# Patient Record
Sex: Female | Born: 1935 | Race: White | Hispanic: No | Marital: Married | Smoking: Never smoker
Health system: Southern US, Community
[De-identification: ages and names within clinical notes are randomized; demographics above are authoritative.]

## PROBLEM LIST (undated history)

## (undated) ENCOUNTER — Emergency Department (HOSPITAL_COMMUNITY): Discharge status: Absent without leave | Payer: Medicare Other

## (undated) DIAGNOSIS — M1990 Unspecified osteoarthritis, unspecified site: Secondary | ICD-10-CM

## (undated) DIAGNOSIS — I4891 Unspecified atrial fibrillation: Secondary | ICD-10-CM

## (undated) DIAGNOSIS — I739 Peripheral vascular disease, unspecified: Secondary | ICD-10-CM

## (undated) DIAGNOSIS — Z8719 Personal history of other diseases of the digestive system: Secondary | ICD-10-CM

## (undated) DIAGNOSIS — N3090 Cystitis, unspecified without hematuria: Secondary | ICD-10-CM

## (undated) DIAGNOSIS — I10 Essential (primary) hypertension: Secondary | ICD-10-CM

## (undated) DIAGNOSIS — Z86718 Personal history of other venous thrombosis and embolism: Secondary | ICD-10-CM

## (undated) DIAGNOSIS — G20C Parkinsonism, unspecified: Secondary | ICD-10-CM

## (undated) DIAGNOSIS — Z853 Personal history of malignant neoplasm of breast: Secondary | ICD-10-CM

## (undated) DIAGNOSIS — G20 Parkinson's disease: Secondary | ICD-10-CM

## (undated) HISTORY — DX: Unspecified atrial fibrillation: I48.91

## (undated) HISTORY — DX: Personal history of malignant neoplasm of breast: Z85.3

## (undated) HISTORY — PX: BACK SURGERY: SHX140

## (undated) HISTORY — PX: MASTECTOMY: SHX3

## (undated) HISTORY — PX: BREAST SURGERY: SHX581

## (undated) HISTORY — PX: CHOLECYSTECTOMY: SHX55

## (undated) HISTORY — PX: JOINT REPLACEMENT: SHX530

## (undated) HISTORY — PX: CARDIAC CATHETERIZATION: SHX172

## (undated) HISTORY — PX: TONSILLECTOMY: SUR1361

## (undated) HISTORY — PX: BREAST LUMPECTOMY: SHX2

---

## 1997-10-18 ENCOUNTER — Other Ambulatory Visit: Admission: RE | Admit: 1997-10-18 | Discharge: 1997-10-18 | Payer: Self-pay | Admitting: Hematology and Oncology

## 1997-11-08 ENCOUNTER — Other Ambulatory Visit: Admission: RE | Admit: 1997-11-08 | Discharge: 1997-11-08 | Payer: Self-pay | Admitting: Hematology and Oncology

## 1997-11-27 ENCOUNTER — Encounter: Admission: RE | Admit: 1997-11-27 | Discharge: 1998-02-25 | Payer: Self-pay | Admitting: Radiation Oncology

## 1998-06-18 ENCOUNTER — Inpatient Hospital Stay (HOSPITAL_COMMUNITY): Admission: EM | Admit: 1998-06-18 | Discharge: 1998-06-24 | Payer: Self-pay | Admitting: Emergency Medicine

## 1998-06-18 ENCOUNTER — Encounter: Payer: Self-pay | Admitting: Emergency Medicine

## 1998-06-18 ENCOUNTER — Encounter: Payer: Self-pay | Admitting: Orthopedic Surgery

## 1998-06-27 ENCOUNTER — Ambulatory Visit (HOSPITAL_COMMUNITY): Admission: RE | Admit: 1998-06-27 | Discharge: 1998-06-27 | Payer: Self-pay | Admitting: Orthopedic Surgery

## 1998-07-03 ENCOUNTER — Encounter (HOSPITAL_COMMUNITY): Admission: RE | Admit: 1998-07-03 | Discharge: 1998-10-01 | Payer: Self-pay | Admitting: Orthopedic Surgery

## 1999-09-23 ENCOUNTER — Ambulatory Visit (HOSPITAL_COMMUNITY): Admission: RE | Admit: 1999-09-23 | Discharge: 1999-09-23 | Payer: Self-pay | Admitting: Hematology and Oncology

## 1999-09-27 ENCOUNTER — Ambulatory Visit (HOSPITAL_COMMUNITY): Admission: RE | Admit: 1999-09-27 | Discharge: 1999-09-27 | Payer: Self-pay | Admitting: Hematology and Oncology

## 1999-10-04 ENCOUNTER — Ambulatory Visit (HOSPITAL_COMMUNITY): Admission: RE | Admit: 1999-10-04 | Discharge: 1999-10-04 | Payer: Self-pay | Admitting: Hematology and Oncology

## 2000-09-29 ENCOUNTER — Encounter: Payer: Self-pay | Admitting: Hematology and Oncology

## 2000-09-29 ENCOUNTER — Ambulatory Visit (HOSPITAL_COMMUNITY): Admission: RE | Admit: 2000-09-29 | Discharge: 2000-09-29 | Payer: Self-pay | Admitting: Hematology and Oncology

## 2002-06-01 ENCOUNTER — Ambulatory Visit (HOSPITAL_COMMUNITY): Admission: RE | Admit: 2002-06-01 | Discharge: 2002-06-01 | Payer: Self-pay | Admitting: Internal Medicine

## 2002-06-24 DIAGNOSIS — Z86718 Personal history of other venous thrombosis and embolism: Secondary | ICD-10-CM | POA: Insufficient documentation

## 2002-06-24 HISTORY — DX: Personal history of other venous thrombosis and embolism: Z86.718

## 2003-04-30 ENCOUNTER — Ambulatory Visit (HOSPITAL_COMMUNITY): Admission: RE | Admit: 2003-04-30 | Discharge: 2003-04-30 | Payer: Self-pay | Admitting: Oncology

## 2004-05-14 ENCOUNTER — Ambulatory Visit: Payer: Self-pay | Admitting: Oncology

## 2004-07-01 ENCOUNTER — Inpatient Hospital Stay (HOSPITAL_COMMUNITY): Admission: EM | Admit: 2004-07-01 | Discharge: 2004-07-02 | Payer: Self-pay | Admitting: Emergency Medicine

## 2004-07-23 ENCOUNTER — Encounter: Admission: RE | Admit: 2004-07-23 | Discharge: 2004-07-23 | Payer: Self-pay | Admitting: *Deleted

## 2004-08-19 ENCOUNTER — Encounter (INDEPENDENT_AMBULATORY_CARE_PROVIDER_SITE_OTHER): Payer: Self-pay | Admitting: Specialist

## 2004-08-19 ENCOUNTER — Observation Stay (HOSPITAL_COMMUNITY): Admission: RE | Admit: 2004-08-19 | Discharge: 2004-08-20 | Payer: Self-pay | Admitting: *Deleted

## 2004-10-23 ENCOUNTER — Ambulatory Visit: Payer: Self-pay | Admitting: Oncology

## 2004-12-02 ENCOUNTER — Ambulatory Visit: Payer: Self-pay | Admitting: Internal Medicine

## 2004-12-16 ENCOUNTER — Encounter (INDEPENDENT_AMBULATORY_CARE_PROVIDER_SITE_OTHER): Payer: Self-pay | Admitting: *Deleted

## 2004-12-16 ENCOUNTER — Ambulatory Visit: Payer: Self-pay | Admitting: Internal Medicine

## 2005-01-04 ENCOUNTER — Ambulatory Visit: Payer: Self-pay | Admitting: Oncology

## 2005-07-02 ENCOUNTER — Ambulatory Visit: Payer: Self-pay | Admitting: Oncology

## 2005-09-24 ENCOUNTER — Inpatient Hospital Stay (HOSPITAL_COMMUNITY): Admission: RE | Admit: 2005-09-24 | Discharge: 2005-09-25 | Payer: Self-pay | Admitting: Specialist

## 2005-11-03 ENCOUNTER — Encounter: Payer: Self-pay | Admitting: Orthopedic Surgery

## 2006-01-03 ENCOUNTER — Ambulatory Visit: Payer: Self-pay | Admitting: Oncology

## 2006-01-04 LAB — COMPREHENSIVE METABOLIC PANEL
ALT: 20 U/L (ref 0–40)
AST: 22 U/L (ref 0–37)
Albumin: 4 g/dL (ref 3.5–5.2)
BUN: 24 mg/dL — ABNORMAL HIGH (ref 6–23)
CO2: 30 mEq/L (ref 19–32)
Calcium: 9.2 mg/dL (ref 8.4–10.5)
Creatinine, Ser: 1.22 mg/dL — ABNORMAL HIGH (ref 0.40–1.20)
Glucose, Bld: 85 mg/dL (ref 70–99)
Total Bilirubin: 0.4 mg/dL (ref 0.3–1.2)
Total Protein: 6.5 g/dL (ref 6.0–8.3)

## 2006-01-04 LAB — CBC WITH DIFFERENTIAL (CANCER CENTER ONLY)
BASO%: 0.8 % (ref 0.0–2.0)
Eosinophils Absolute: 0.2 10*3/uL (ref 0.0–0.5)
LYMPH#: 2 10*3/uL (ref 0.9–3.3)
LYMPH%: 33.3 % (ref 14.0–48.0)
MCV: 92 fL (ref 81–101)
MONO#: 0.4 10*3/uL (ref 0.1–0.9)
NEUT#: 3.5 10*3/uL (ref 1.5–6.5)
Platelets: 249 10*3/uL (ref 145–400)
RBC: 4.53 10*6/uL (ref 3.70–5.32)
RDW: 13.5 % (ref 10.5–14.6)
WBC: 6.1 10*3/uL (ref 3.9–10.0)

## 2006-03-24 ENCOUNTER — Encounter: Admission: RE | Admit: 2006-03-24 | Discharge: 2006-03-24 | Payer: Self-pay | Admitting: Specialist

## 2006-03-30 ENCOUNTER — Encounter: Admission: RE | Admit: 2006-03-30 | Discharge: 2006-03-30 | Payer: Self-pay | Admitting: Specialist

## 2006-07-04 ENCOUNTER — Ambulatory Visit: Payer: Self-pay | Admitting: Oncology

## 2006-07-06 LAB — COMPREHENSIVE METABOLIC PANEL
AST: 24 U/L (ref 0–37)
Alkaline Phosphatase: 83 U/L (ref 39–117)
BUN: 24 mg/dL — ABNORMAL HIGH (ref 6–23)
Creatinine, Ser: 1.08 mg/dL (ref 0.40–1.20)
Glucose, Bld: 97 mg/dL (ref 70–99)

## 2006-07-06 LAB — CBC WITH DIFFERENTIAL (CANCER CENTER ONLY)
BASO%: 0.6 % (ref 0.0–2.0)
Eosinophils Absolute: 0.1 10*3/uL (ref 0.0–0.5)
HCT: 42.4 % (ref 34.8–46.6)
HGB: 14.1 g/dL (ref 11.6–15.9)
LYMPH#: 1.7 10*3/uL (ref 0.9–3.3)
LYMPH%: 31 % (ref 14.0–48.0)
MCV: 91 fL (ref 81–101)
MONO#: 0.3 10*3/uL (ref 0.1–0.9)
NEUT%: 60.4 % (ref 39.6–80.0)
RBC: 4.64 10*6/uL (ref 3.70–5.32)
RDW: 14.6 % (ref 10.5–14.6)
WBC: 5.3 10*3/uL (ref 3.9–10.0)

## 2006-09-22 ENCOUNTER — Ambulatory Visit (HOSPITAL_COMMUNITY): Admission: RE | Admit: 2006-09-22 | Discharge: 2006-09-22 | Payer: Self-pay | Admitting: Neurological Surgery

## 2006-10-18 ENCOUNTER — Inpatient Hospital Stay (HOSPITAL_COMMUNITY): Admission: RE | Admit: 2006-10-18 | Discharge: 2006-10-22 | Payer: Self-pay | Admitting: Neurological Surgery

## 2007-01-03 ENCOUNTER — Ambulatory Visit: Payer: Self-pay | Admitting: Oncology

## 2007-01-04 LAB — COMPREHENSIVE METABOLIC PANEL
AST: 18 U/L (ref 0–37)
Albumin: 4.1 g/dL (ref 3.5–5.2)
BUN: 30 mg/dL — ABNORMAL HIGH (ref 6–23)
Calcium: 9.7 mg/dL (ref 8.4–10.5)
Chloride: 104 mEq/L (ref 96–112)
Glucose, Bld: 126 mg/dL — ABNORMAL HIGH (ref 70–99)
Potassium: 5.1 mEq/L (ref 3.5–5.3)
Sodium: 141 mEq/L (ref 135–145)
Total Protein: 6.6 g/dL (ref 6.0–8.3)

## 2007-01-04 LAB — CBC WITH DIFFERENTIAL (CANCER CENTER ONLY)
BASO#: 0.1 10*3/uL (ref 0.0–0.2)
EOS%: 1.6 % (ref 0.0–7.0)
HCT: 37.5 % (ref 34.8–46.6)
HGB: 12.2 g/dL (ref 11.6–15.9)
LYMPH#: 2.5 10*3/uL (ref 0.9–3.3)
LYMPH%: 30.5 % (ref 14.0–48.0)
MCHC: 32.5 g/dL (ref 32.0–36.0)
MCV: 83 fL (ref 81–101)
MONO#: 0.5 10*3/uL (ref 0.1–0.9)
NEUT%: 60.8 % (ref 39.6–80.0)
RDW: 13.1 % (ref 10.5–14.6)

## 2007-08-03 ENCOUNTER — Encounter: Admission: RE | Admit: 2007-08-03 | Discharge: 2007-08-03 | Payer: Self-pay | Admitting: Neurological Surgery

## 2007-12-29 ENCOUNTER — Ambulatory Visit: Payer: Self-pay | Admitting: Oncology

## 2008-01-04 LAB — CBC WITH DIFFERENTIAL (CANCER CENTER ONLY)
BASO%: 0.6 % (ref 0.0–2.0)
Eosinophils Absolute: 0.4 10*3/uL (ref 0.0–0.5)
LYMPH%: 30.3 % (ref 14.0–48.0)
MCH: 30.6 pg (ref 26.0–34.0)
MONO%: 6.2 % (ref 0.0–13.0)
NEUT#: 3.4 10*3/uL (ref 1.5–6.5)
Platelets: 253 10*3/uL (ref 145–400)
RBC: 4.56 10*6/uL (ref 3.70–5.32)
RDW: 12 % (ref 10.5–14.6)
WBC: 6 10*3/uL (ref 3.9–10.0)

## 2008-01-04 LAB — COMPREHENSIVE METABOLIC PANEL
AST: 24 U/L (ref 0–37)
Albumin: 4.1 g/dL (ref 3.5–5.2)
Alkaline Phosphatase: 103 U/L (ref 39–117)
Calcium: 9.3 mg/dL (ref 8.4–10.5)
Chloride: 107 mEq/L (ref 96–112)
Glucose, Bld: 92 mg/dL (ref 70–99)
Potassium: 4.2 mEq/L (ref 3.5–5.3)
Sodium: 145 mEq/L (ref 135–145)
Total Protein: 6.7 g/dL (ref 6.0–8.3)

## 2008-03-11 IMAGING — CT CT PELVIS W/ CM
2 of 5 series · 15 of 46 positions shown, 17 images · IV contrast (omnipaque)
Comparison: None.

CLINICAL DATA: Breast cancer in 6111.  Status post chemo and XRT.  Now with left-sided chest pain mainly under the left breast.
 CHEST CT WITH CONTRAST ? 09/24/05:
TECHNIQUE: Multidetector CT imaging of the chest was performed following the standard protocol during bolus administration of intravenous contrast.   
 Contrast:  125 cc Omnipaque 300 IV.
TECHNIQUE: Multidetector CT imaging of the abdomen was performed following the standard protocol during bolus administration of intravenous contrast.
TECHNIQUE: Multidetector CT imaging of the pelvis was performed following the standard protocol during bolus administration of intravenous contrast.

[Series 2: cap 5.0 b40f st · axial · 0.72mm/px · z∈[-459,+101]mm · 12 of 126 slices shown, 14 images]
[im 7/126  soft-tissue]
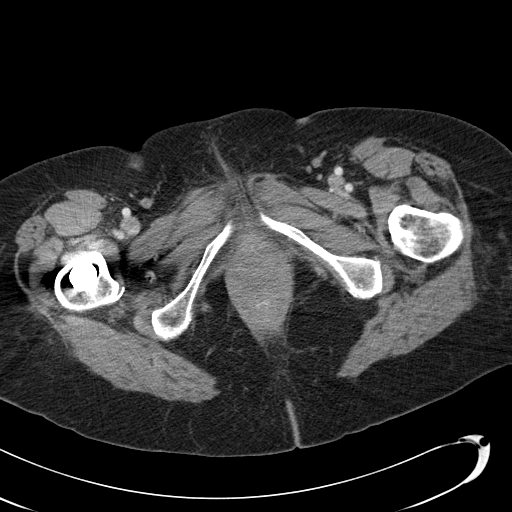
[im 7/126  bone]
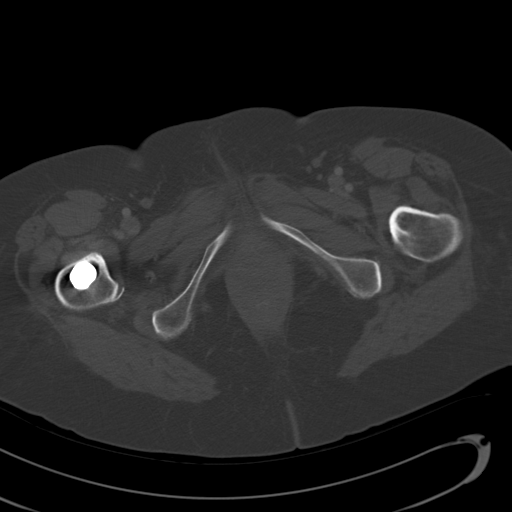
[im 21/126  soft-tissue]
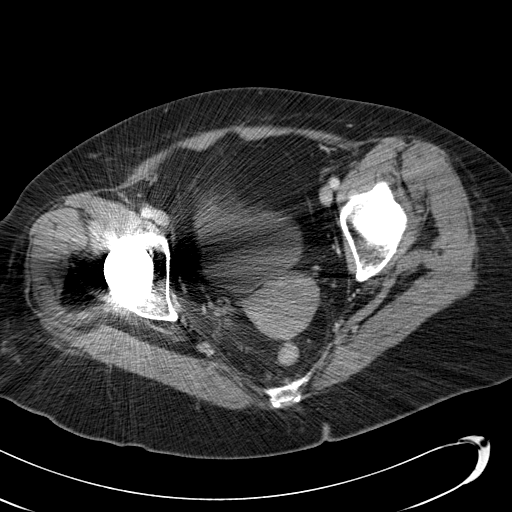
[im 28/126  soft-tissue]
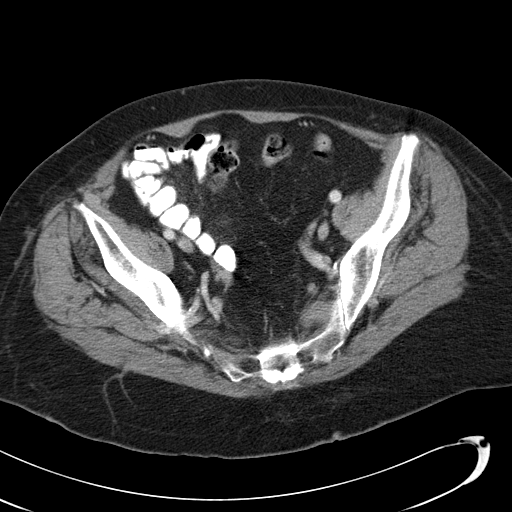
[im 35/126  soft-tissue]
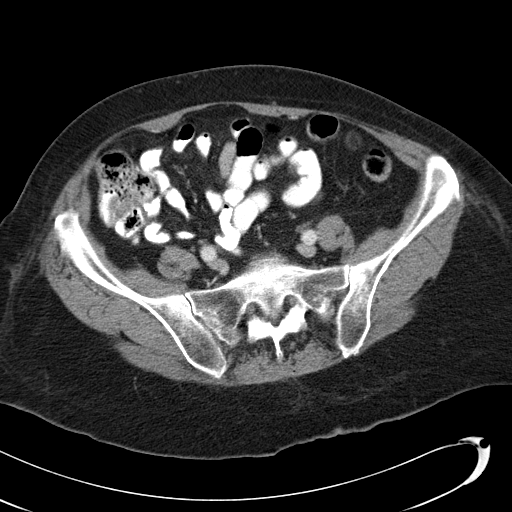
[im 49/126  soft-tissue]
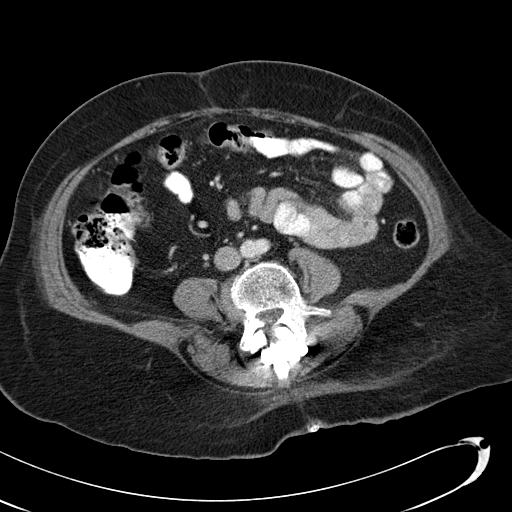
[im 56/126  soft-tissue]
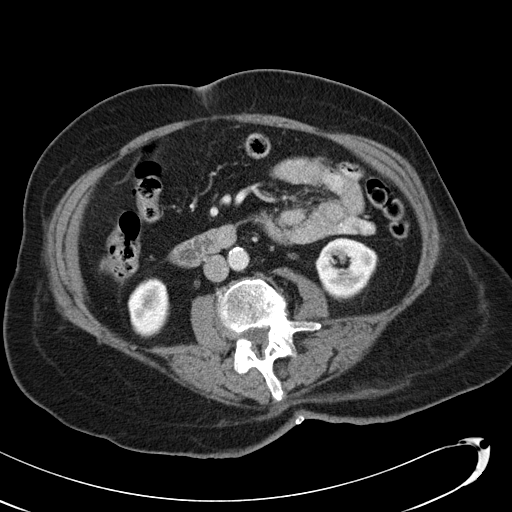
[im 70/126  soft-tissue]
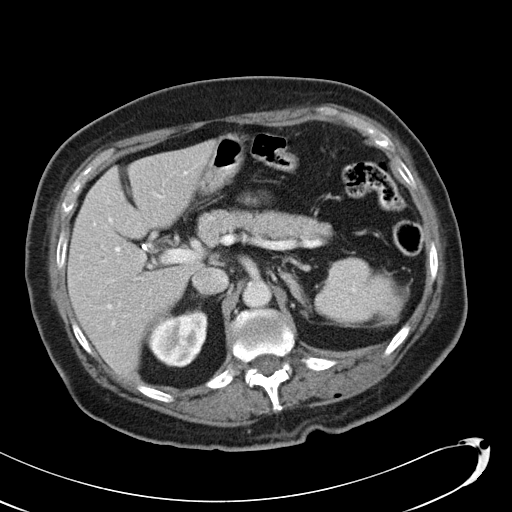
[im 77/126  soft-tissue]
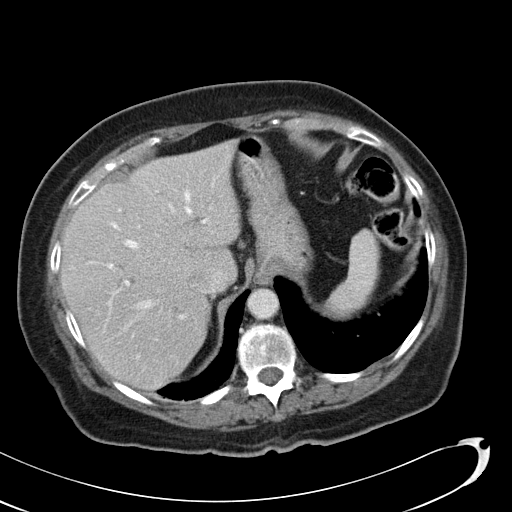
[im 91/126  soft-tissue]
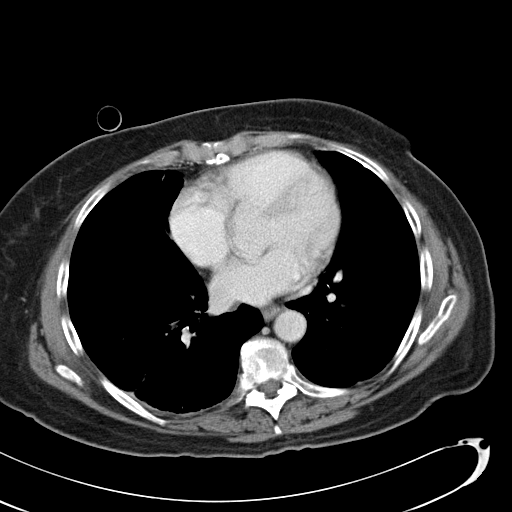
[im 91/126  bone]
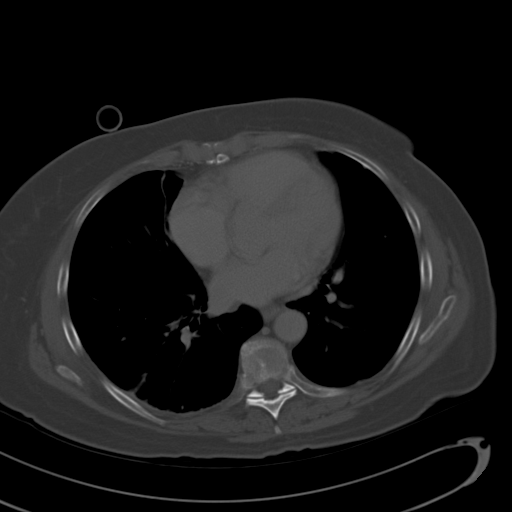
[im 98/126  soft-tissue]
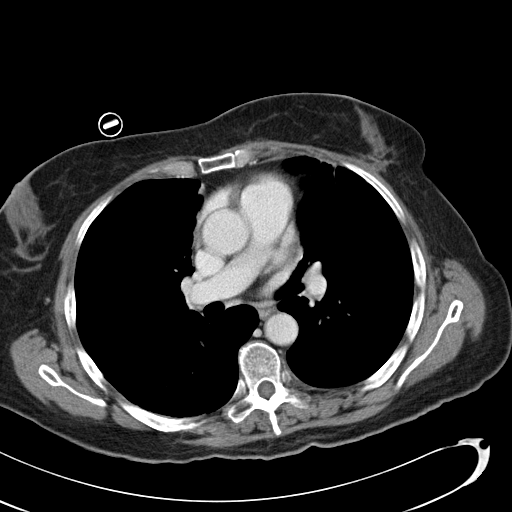
[im 105/126  soft-tissue]
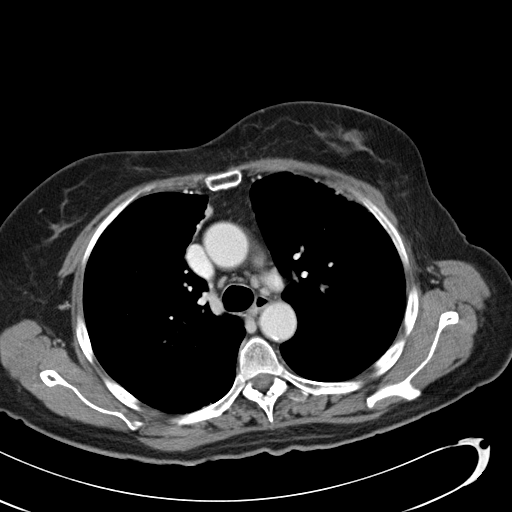
[im 119/126  soft-tissue]
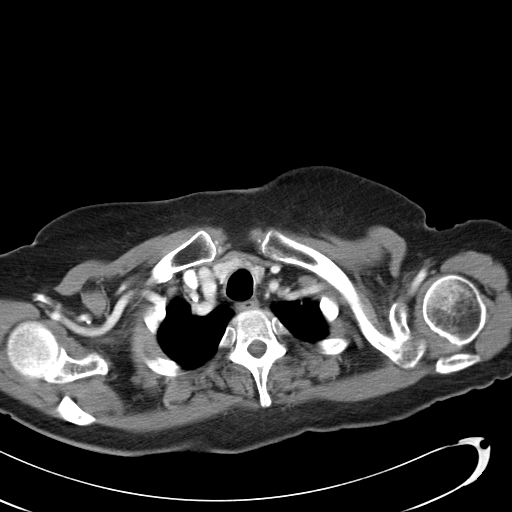

[Series 602: coronal · coronal · 1.27mm/px · 3 of 39 slices shown]
[im 13/39  soft-tissue]
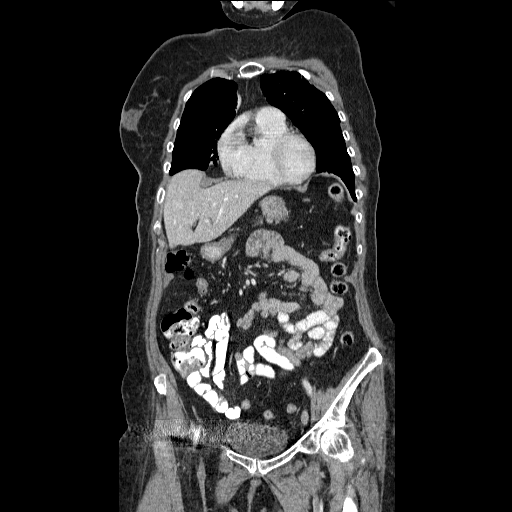
[im 17/39  soft-tissue]
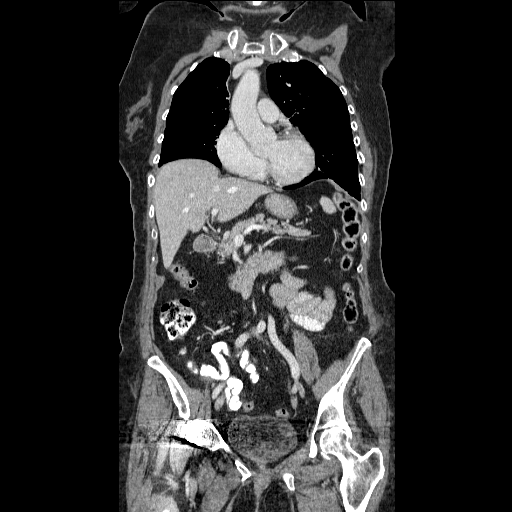
[im 22/39  soft-tissue]
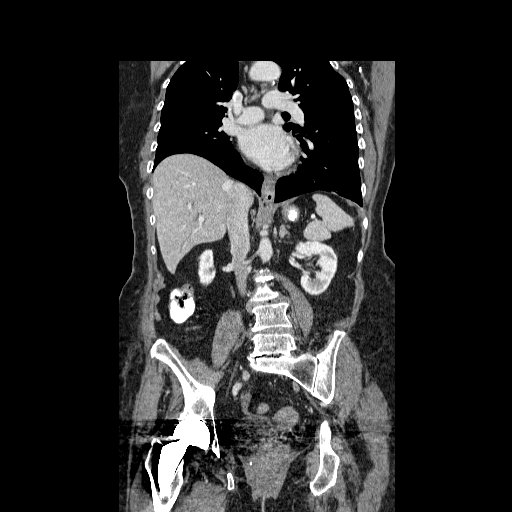

[15 of 46 positions shown; findings below may reference images not displayed]

FINDINGS: The patient is status post left lumpectomy and left axillary lymph node dissection.  There are no pathologically enlarged left axillary lymph nodes.  There is a spiculated soft tissue nodule within the inferior-lateral left breast which likely represents a postsurgical scar.  For reference purposes this measures approximately 12.4 x 22.5 mm (image #31).  There is no right axillary lymphadenopathy.  
 There is no enlarged mediastinal or hilar lymphadenopathy.
 No pleural or pericardial effusions are noted.  
 Probable post-radiation scarring is seen within the left upper lobe.  Scar versus atelectasis is seen within the right middle lobe and the right lower lobe.
 A calcified granuloma is identified in the right upper lobe.  No suspicious pulmonary nodules or masses are noted.
 Review of the bone windows shows no lytic or sclerotic lesions.
IMPRESSION: 1.  Post-therapeutic changes in the left breast and left axilla consistent with lumpectomy and axillary lymph node dissection.  There is a scarlike soft tissue nodule with peripheral spiculation likely representing a postsurgical scar.  This is identified within the lateral inferior left breast.
 2.  Scarring and probable radiation type changes are identified within the left upper lobe.  There are scattered areas of scarring and/or atelectasis within the right middle lobe and right lower lobe.  
 ABDOMEN CT WITH CONTRAST ? 09/24/05:
FINDINGS: There is mild intrahepatic biliary ductal dilatation.  The common bile duct is also prominent.  This measures 10.1 mm.  The patient is status post cholecystectomy.  The differential diagnosis includes distal common bile duct stricture, impacted stone, or tumor.  The pancreatic duct is normal in caliber.  The pancreatic parenchyma is normal.  
 The adrenal glands are negative.
 There is no pathologically enlarged retroperitoneal or mesenteric lymphadenopathy.
 Postsurgical changes are identified within the soft tissues posterior to the lower lumbar spine.
IMPRESSION: 1.  Mild intrahepatic biliary ductal dilatation and moderate common bile duct dilatation measuring up to 10 mm.  The differential diagnosis is discussed above but includes distal common bile duct stricture, impacted stone, or an ampullary tumor.  No focal liver lesions are noted.
 2.  Postoperative changes within the soft tissues overlying the lumbar spine.
 PELVIS CT WITH CONTRAST ? 09/24/05:
FINDINGS: There is no pathologically enlarged pelvic lymphadenopathy.  There is no inguinal lymphadenopathy.  The urinary bladder is negative.  The uterus and adnexal structures are negative. 
 Review of the bone windows shows a right total hip arthroplasty has been performed.  No lytic or sclerotic lesions are identified.
IMPRESSION: Postoperative changes from lumbar spine surgery and right total hip arthroplasty.  No evidence for pelvic metastatic disease.

## 2008-03-11 IMAGING — US US ABDOMEN COMPLETE
1 series · 14 of 25 positions shown · non-contrast
Comparison: none

CLINICAL DATA: Abnormal liver function test.
 ABDOMEN ULTRASOUND ? 09/24/05 AT 7177 HOURS:
TECHNIQUE: Complete abdominal ultrasound examination was performed including evaluation of the liver, gallbladder, bile ducts, pancreas, kidneys, spleen, IVC, and abdominal aorta.

[Series 1: unknown · 0.35mm/px · 14 of 66 slices shown]
[im 1/66]
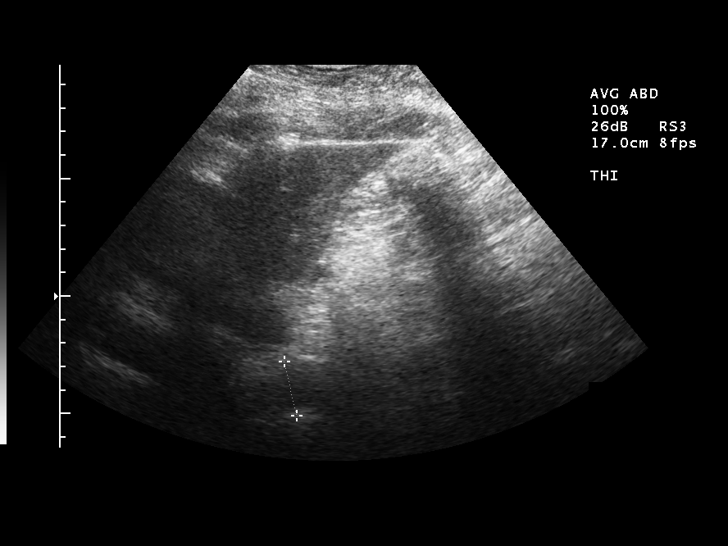
[im 6/66]
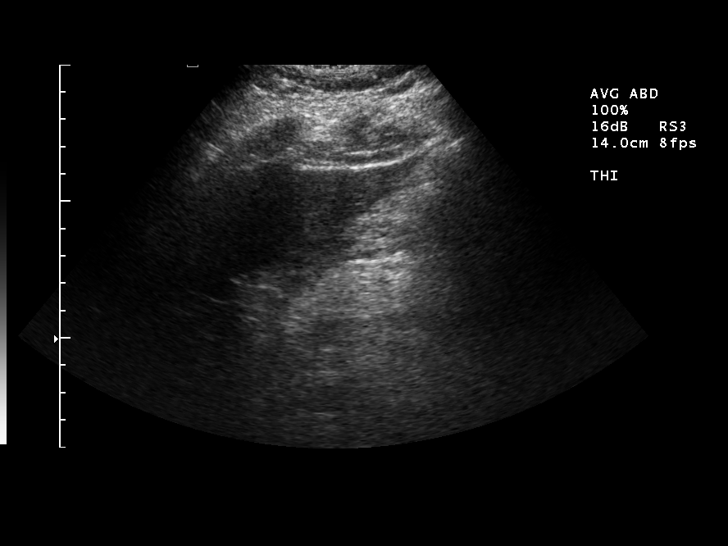
[im 11/66]
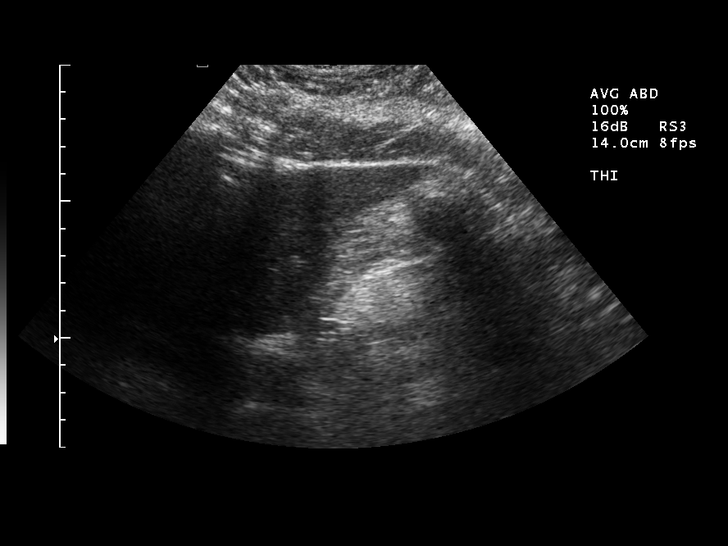
[im 17/66]
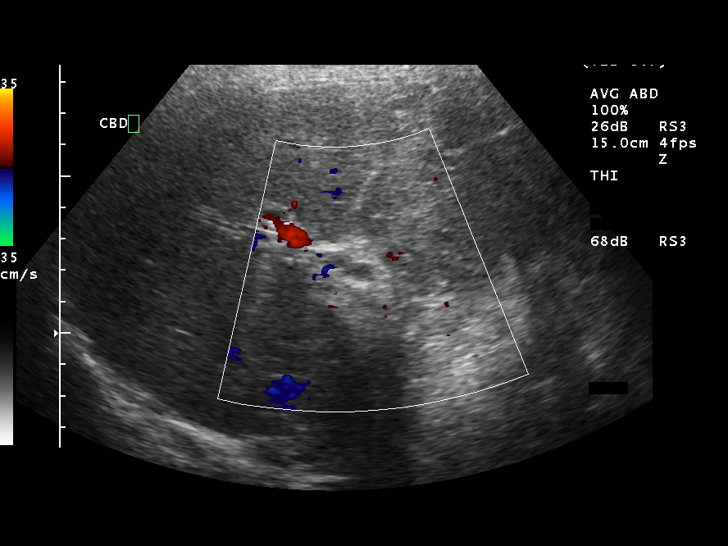
[im 22/66]
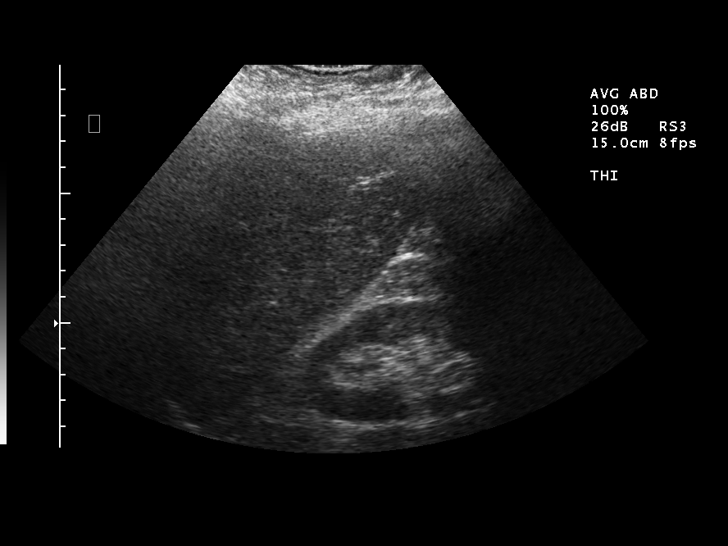
[im 25/66]
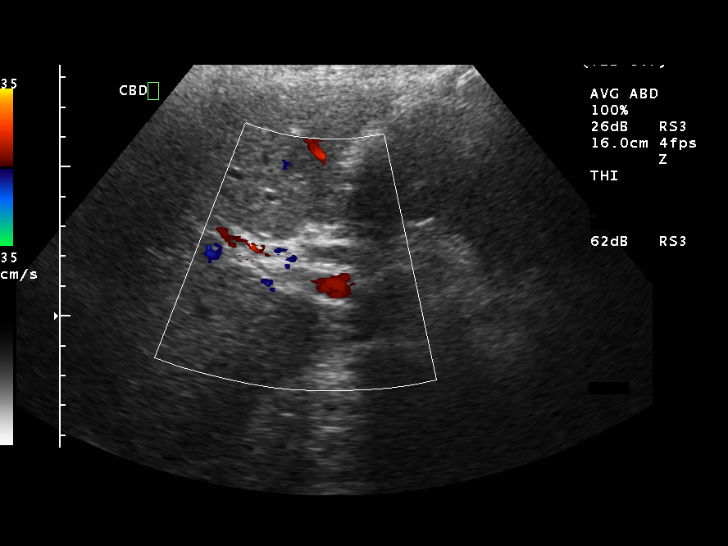
[im 30/66]
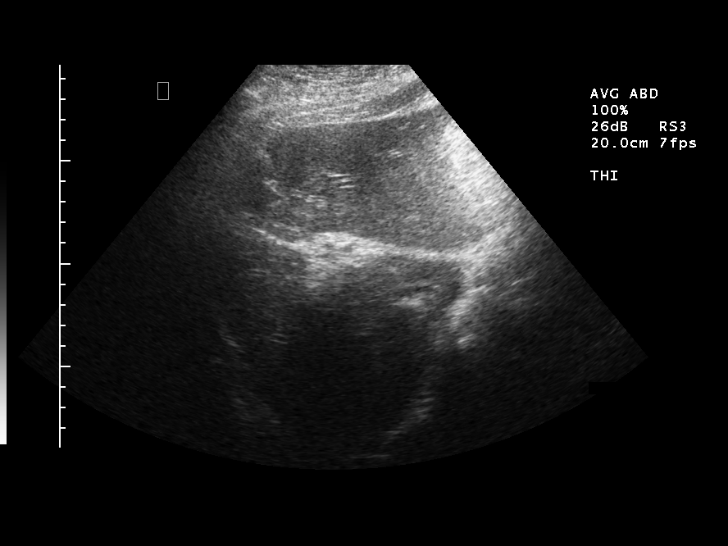
[im 36/66]
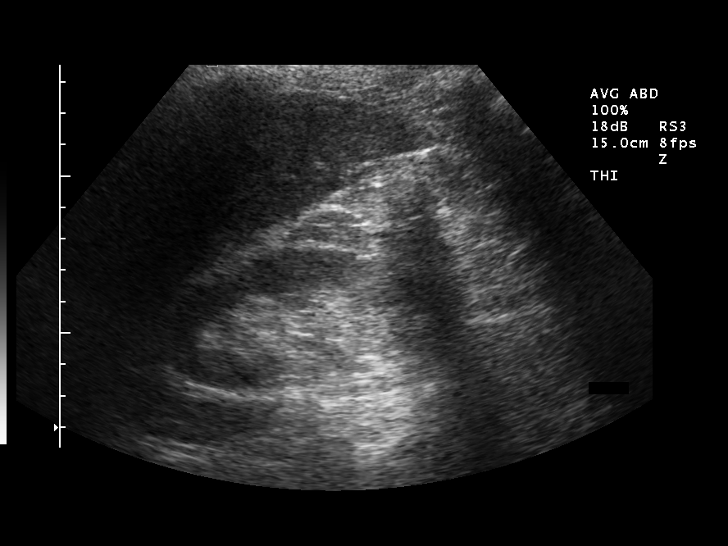
[im 41/66]
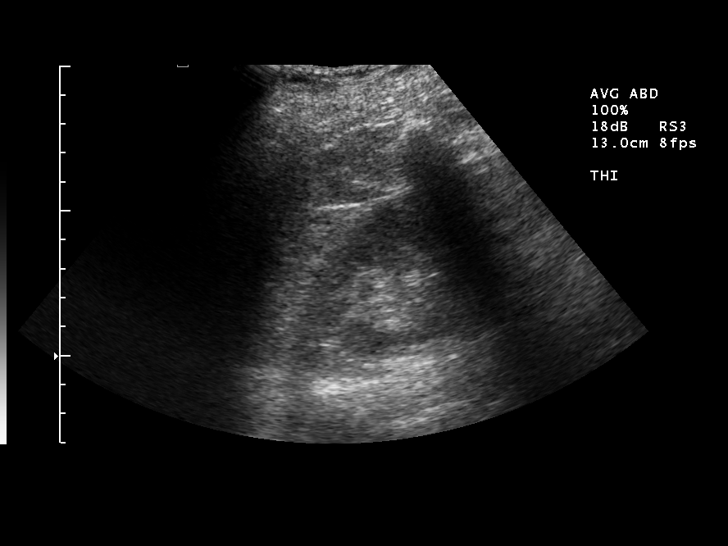
[im 44/66]
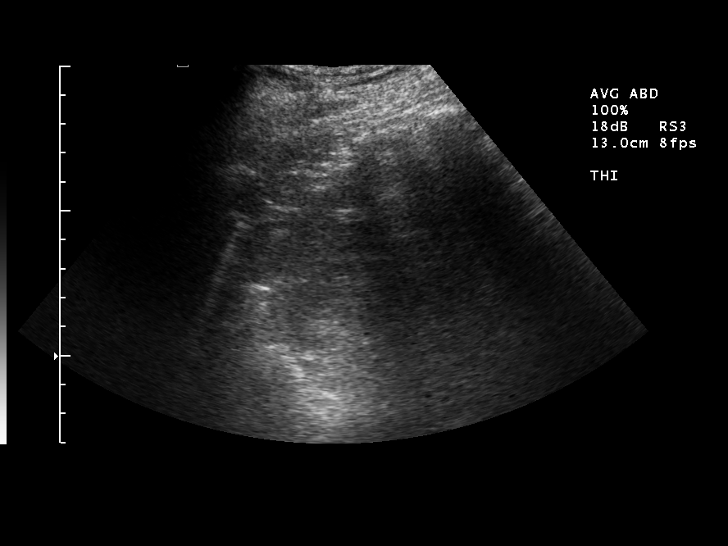
[im 49/66]
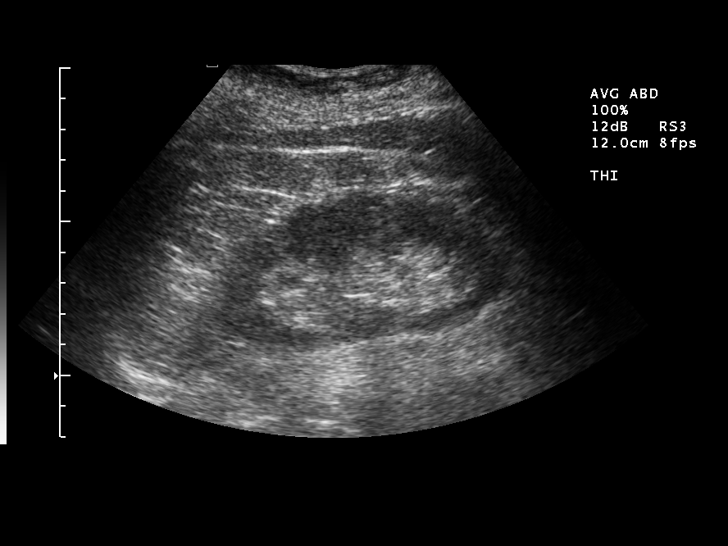
[im 55/66]
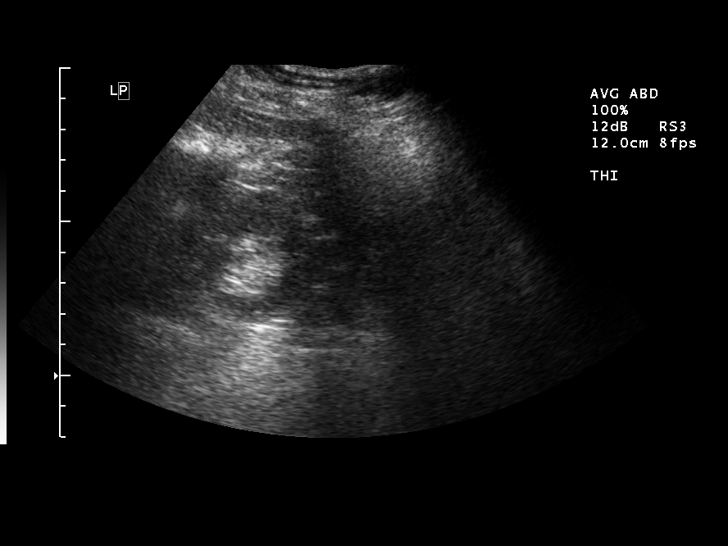
[im 60/66]
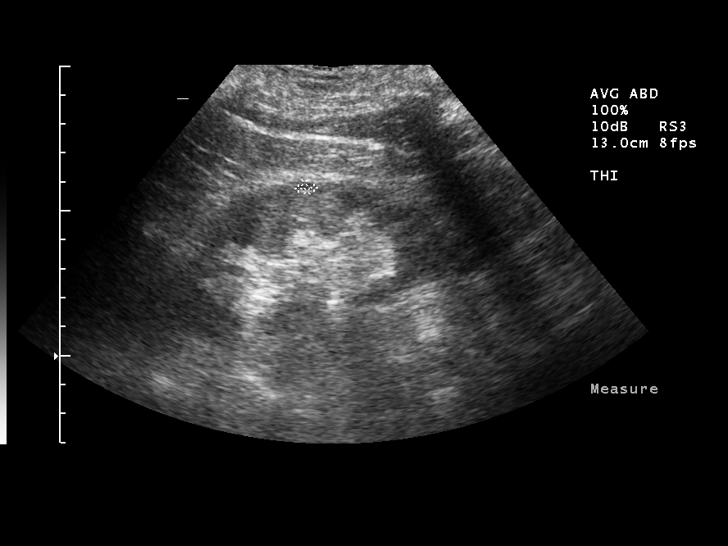
[im 66/66]
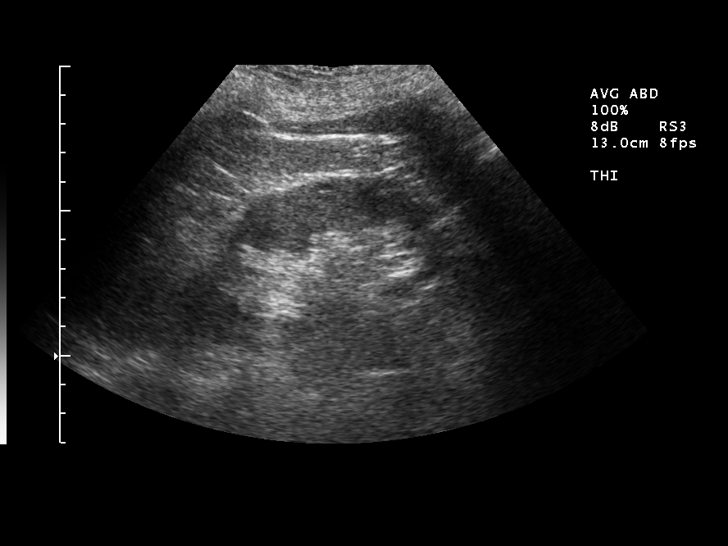

[14 of 25 positions shown; findings below may reference images not displayed]

FINDINGS: The gallbladder is surgically absent.  
 The liver is normal in echogenicity without focal mass.  The common bile duct is 7 mm in caliber.  The IVC is patent.
 The pancreas is obscured by overlying bowel gas.  
 The spleen is within normal limits.
 The right and left kidneys are 10.5 cm and 9.7 cm in length respectively, with normal echogenicity but no hydronephrosis or focal mass.  There is a 5 mm simple cyst in the interpolar region of the left kidney laterally.
 Maximal aortic caliber is 2.4 cm.
IMPRESSION: 1.  Limited visualization of the pancreas.
 2.  Simple cyst in the left kidney.
 3.  Liver is within normal limits.

## 2008-07-15 ENCOUNTER — Ambulatory Visit (HOSPITAL_COMMUNITY): Admission: RE | Admit: 2008-07-15 | Discharge: 2008-07-15 | Payer: Self-pay | Admitting: Neurological Surgery

## 2008-09-11 ENCOUNTER — Encounter: Admission: RE | Admit: 2008-09-11 | Discharge: 2008-09-11 | Payer: Self-pay | Admitting: Neurology

## 2008-11-06 ENCOUNTER — Encounter
Admission: RE | Admit: 2008-11-06 | Discharge: 2008-11-06 | Payer: Self-pay | Admitting: Physical Medicine and Rehabilitation

## 2009-10-29 ENCOUNTER — Inpatient Hospital Stay (HOSPITAL_COMMUNITY): Admission: EM | Admit: 2009-10-29 | Discharge: 2009-11-03 | Payer: Self-pay | Admitting: Emergency Medicine

## 2009-12-17 ENCOUNTER — Encounter (INDEPENDENT_AMBULATORY_CARE_PROVIDER_SITE_OTHER): Payer: Self-pay | Admitting: *Deleted

## 2010-02-02 ENCOUNTER — Encounter: Admission: RE | Admit: 2010-02-02 | Discharge: 2010-02-02 | Payer: Self-pay | Admitting: Neurology

## 2010-02-09 ENCOUNTER — Encounter: Admission: RE | Admit: 2010-02-09 | Discharge: 2010-02-09 | Payer: Self-pay | Admitting: Neurology

## 2010-04-02 ENCOUNTER — Inpatient Hospital Stay (HOSPITAL_COMMUNITY)
Admission: RE | Admit: 2010-04-02 | Discharge: 2010-04-08 | Payer: Self-pay | Source: Home / Self Care | Admitting: Neurological Surgery

## 2010-06-25 ENCOUNTER — Encounter
Admission: RE | Admit: 2010-06-25 | Discharge: 2010-06-25 | Payer: Self-pay | Source: Home / Self Care | Attending: Neurology | Admitting: Neurology

## 2010-07-26 ENCOUNTER — Encounter: Payer: Self-pay | Admitting: Neurological Surgery

## 2010-07-26 ENCOUNTER — Encounter: Payer: Self-pay | Admitting: Neurology

## 2010-08-04 NOTE — Letter (Signed)
Summary: Colonoscopy Letter  [facility]  [address] [place], [place] [address]   Phone: [phone]  Fax: [phone]      December 17, 2009 MRN: [id number]   [facility] [address] [place], [place]  [address]   Dear Ms. Nova,   According to your medical record, it is time for you to schedule a Colonoscopy. The American Cancer Society recommends this procedure as a method to detect early colon cancer. Patients with a family history of colon cancer, or a personal history of colon polyps or inflammatory bowel disease are at increased risk.  This letter has been generated based on the recommendations made at the time of your procedure. If you feel that in your particular situation this may no longer apply, please contact our office.  Please call our office at [phone] to schedule this appointment or to update your records at your earliest convenience.  Thank you for cooperating with [place] to provide you with the very best care possible.   Sincerely,  Wilhemina Bonito. Marina Goodell, M.D.  [facility] [phone]

## 2010-09-17 LAB — CBC
Hemoglobin: 11.3 g/dL — ABNORMAL LOW (ref 12.0–15.0)
Hemoglobin: 14.7 g/dL (ref 12.0–15.0)
MCH: 30.9 pg (ref 26.0–34.0)
MCH: 31 pg (ref 26.0–34.0)
MCV: 93.1 fL (ref 78.0–100.0)
MCV: 93.2 fL (ref 78.0–100.0)
RBC: 3.65 MIL/uL — ABNORMAL LOW (ref 3.87–5.11)
RBC: 4.76 MIL/uL (ref 3.87–5.11)

## 2010-09-17 LAB — BASIC METABOLIC PANEL
BUN: 10 mg/dL (ref 6–23)
CO2: 29 mEq/L (ref 19–32)
CO2: 30 mEq/L (ref 19–32)
CO2: 32 mEq/L (ref 19–32)
Calcium: 8.6 mg/dL (ref 8.4–10.5)
Calcium: 9.1 mg/dL (ref 8.4–10.5)
Chloride: 102 mEq/L (ref 96–112)
Chloride: 106 mEq/L (ref 96–112)
GFR calc Af Amer: >60 mL/min (ref >60)
GFR calc Af Amer: >60 mL/min (ref >60)
Glucose, Bld: 89 mg/dL (ref 70–99)
Potassium: 3.9 mEq/L (ref 3.5–5.1)
Potassium: 4 mEq/L (ref 3.5–5.1)
Sodium: 140 mEq/L (ref 135–145)
Sodium: 141 mEq/L (ref 135–145)
Sodium: 142 mEq/L (ref 135–145)

## 2010-09-22 LAB — URINE CULTURE: Colony Count: >=100000

## 2010-09-22 LAB — DIFFERENTIAL
Eosinophils Absolute: 0 10*3/uL (ref 0.0–0.7)
Eosinophils Relative: 0 % (ref 0–5)
Lymphs Abs: 1.5 10*3/uL (ref 0.7–4.0)
Monocytes Absolute: 0.9 10*3/uL (ref 0.1–1.0)
Monocytes Relative: 6 % (ref 3–12)

## 2010-09-22 LAB — CBC
HCT: 37.8 % (ref 36.0–46.0)
HCT: 38.2 % (ref 36.0–46.0)
Hemoglobin: 12.3 g/dL (ref 12.0–15.0)
Hemoglobin: 13 g/dL (ref 12.0–15.0)
MCHC: 32.9 g/dL (ref 30.0–36.0)
MCHC: 33.5 g/dL (ref 30.0–36.0)
MCV: 92.7 fL (ref 78.0–100.0)
MCV: 94.4 fL (ref 78.0–100.0)
Platelets: 219 10*3/uL (ref 150–400)
Platelets: 238 10*3/uL (ref 150–400)
Platelets: 249 10*3/uL (ref 150–400)
RBC: 4.54 MIL/uL (ref 3.87–5.11)
RDW: 14.6 % (ref 11.5–15.5)
RDW: 14.7 % (ref 11.5–15.5)
RDW: 14.7 % (ref 11.5–15.5)
RDW: 15 % (ref 11.5–15.5)
WBC: 9.9 10*3/uL (ref 4.0–10.5)

## 2010-09-22 LAB — COMPREHENSIVE METABOLIC PANEL
ALT: 29 U/L (ref 0–35)
ALT: 29 U/L (ref 0–35)
ALT: 35 U/L (ref 0–35)
AST: 31 U/L (ref 0–37)
AST: 35 U/L (ref 0–37)
Albumin: 2.8 g/dL — ABNORMAL LOW (ref 3.5–5.2)
Albumin: 2.8 g/dL — ABNORMAL LOW (ref 3.5–5.2)
Albumin: 3.5 g/dL (ref 3.5–5.2)
Alkaline Phosphatase: 110 U/L (ref 39–117)
Alkaline Phosphatase: 110 U/L (ref 39–117)
BUN: 24 mg/dL — ABNORMAL HIGH (ref 6–23)
CO2: 27 mEq/L (ref 19–32)
Calcium: 8.6 mg/dL (ref 8.4–10.5)
Calcium: 8.8 mg/dL (ref 8.4–10.5)
Chloride: 104 mEq/L (ref 96–112)
Creatinine, Ser: 0.99 mg/dL (ref 0.4–1.2)
Creatinine, Ser: 1.31 mg/dL — ABNORMAL HIGH (ref 0.4–1.2)
GFR calc Af Amer: 46 mL/min — ABNORMAL LOW (ref >60)
GFR calc Af Amer: 48 mL/min — ABNORMAL LOW (ref >60)
GFR calc Af Amer: >60 mL/min (ref >60)
GFR calc non Af Amer: 39 mL/min — ABNORMAL LOW (ref >60)
Glucose, Bld: 106 mg/dL — ABNORMAL HIGH (ref 70–99)
Glucose, Bld: 126 mg/dL — ABNORMAL HIGH (ref 70–99)
Potassium: 3.8 mEq/L (ref 3.5–5.1)
Potassium: 4 mEq/L (ref 3.5–5.1)
Sodium: 134 mEq/L — ABNORMAL LOW (ref 135–145)
Sodium: 136 mEq/L (ref 135–145)
Sodium: 138 mEq/L (ref 135–145)
Total Bilirubin: 0.6 mg/dL (ref 0.3–1.2)
Total Bilirubin: 0.6 mg/dL (ref 0.3–1.2)
Total Protein: 6 g/dL (ref 6.0–8.3)
Total Protein: 6.1 g/dL (ref 6.0–8.3)
Total Protein: 6.2 g/dL (ref 6.0–8.3)
Total Protein: 6.8 g/dL (ref 6.0–8.3)

## 2010-09-22 LAB — CARDIAC PANEL(CRET KIN+CKTOT+MB+TROPI)
CK, MB: 2.6 ng/mL (ref 0.3–4.0)
Relative Index: 1 (ref 0.0–2.5)
Total CK: 269 U/L — ABNORMAL HIGH (ref 7–177)
Troponin I: 0.05 ng/mL (ref 0.00–0.06)

## 2010-09-22 LAB — CULTURE, BLOOD (ROUTINE X 2)
Culture: NO GROWTH
Culture: NO GROWTH

## 2010-09-22 LAB — URINALYSIS, ROUTINE W REFLEX MICROSCOPIC
Glucose, UA: NEGATIVE mg/dL
pH: 5.5 (ref 5.0–8.0)

## 2010-09-22 LAB — TSH: TSH: 2.726 u[IU]/mL (ref 0.350–4.500)

## 2010-09-22 LAB — URINE MICROSCOPIC-ADD ON

## 2010-11-20 NOTE — Op Note (Signed)
Angel Greene, Angel Greene                 ACCOUNT NO.:  [id number]   MEDICAL RECORD NO.:  [id number]          PATIENT TYPE:  AMB   LOCATION:  DAY                          FACILITY:  [facility]   PHYSICIAN:  Vikki Ports, MDDATE OF BIRTH:  09/25/1929   DATE OF PROCEDURE:  08/19/2004  DATE OF DISCHARGE:                                 OPERATIVE REPORT   PREOPERATIVE DIAGNOSIS:  Symptomatic cholelithiasis with dilated common bile  ducts.   POSTOPERATIVE DIAGNOSIS:  Symptomatic cholelithiasis with dilated common  bile ducts with normal cholangiograms.   PROCEDURE:  Laparoscopic cholecystectomy with intraoperative cholangiogram.   SURGEON:  Vikki Ports, M.D.   ASSISTANT:  Leonie Man, M.D.   ANESTHESIA:  General.   DESCRIPTION OF PROCEDURE:  The patient was taken to the operating room and  placed in a supine position. After adequate anesthesia was induced via the  endotracheal tube, the abdomen was prepped and draped in normal sterile  fashion. Using a supraumbilical transverse incision, I dissected down onto a  small umbilical hernia sac. This was opened, and contents were reduced. A 0  Vicryl suture was placed around the defect, and Hasson trocar was placed in  the abdomen. Abdomen was insufflated with continuous flow carbon dioxide.  There were a few omental adhesions to the left upper quadrant which were  taken down. A 10-mm trocar was placed in the subxiphoid region; two 5-mm  trocars were placed in the mid abdomen. Gallbladder was identified and  retracted cephalad. It was somewhat tense. The cystic duct was easily  dissected from the infundibulum with a good window created posterior to it.  It was clipped on the gallbladder, small ductotomy was made, and  cholangiogram was performed which showed flow into the duodenum, normal  common bile duct, and normal right and left hepatic ducts. There were no  filling defects. Cholangiocatheter was removed, and duct was triply  clipped  and divided. Cystic artery was then identified, triply clipped, and divided.  Gallbladder was taken off the gallbladder bed using Bovie electrocautery.  Small rent was made in the posterior wall of the gallbladder, and it was  placed in an EndoCatch bag and removed through the umbilical port. Adequate  hemostasis was ensured. Right lower quadrant was copiously irrigated.  Pneumoperitoneum was released. Trocars were removed. The supraumbilical  fascial defect was closed with 3-0 Vicryl pursestring sutures. Skin  incisions were closed with subcuticular 4-0 Monocryl. Steri-Strips and  sterile dressings were applied. The patient tolerated the procedure well and  went to the PACU in good condition.      KRH/MEDQ  D:  08/19/2004  T:  08/19/2004  Job:  [id number]

## 2010-11-20 NOTE — Op Note (Signed)
Angel Greene, Angel Greene                 ACCOUNT NO.:  [id number]   MEDICAL RECORD NO.:  [id number]          PATIENT TYPE:  INP   LOCATION:  3172                         FACILITY:  [facility]   PHYSICIAN:  Stefani Dama, M.D.  DATE OF BIRTH:  12/23/1929   DATE OF PROCEDURE:  10/18/2006  DATE OF DISCHARGE:                               OPERATIVE REPORT   PREOPERATIVE DIAGNOSIS:  Spondylosis and stenosis L3-L4 and L4-L5 with  scoliosis, lumbar radiculopathy status X-Stop procedure L3-L4 and L4-L5.   POSTOPERATIVE DIAGNOSIS:  Spondylosis and stenosis L3-L4 and L4-L5 with  scoliosis, lumbar radiculopathy status X-Stop procedure L3-L4 and L4-L5.   PROCEDURE:  Laminectomy of L3 and L4, discectomy L3-L4 and L4-L5,  decompression of L3, L4, and L5 nerve roots individually, posterior  lumbar interbody arthrodesis with PEEK spacers, segmental fixation L3 to  L5 with posterolateral arthrodesis using local autograft and allograft.   SURGEON:  Stefani Dama, M.D.   FIRST ASSISTANT:  Hewitt Shorts, M.D.   ANESTHESIA:  General endotracheal   INDICATIONS:  Ramsha Lonigro a 77 year old individual who was treated by  Dr. Jene Every for neurogenic claudication.  He placed X-Stop at L3-  L4 and L4-L5.  The procedure did not provide the patient any relief.  On  subsequent workup, it was noted that she had severe spondylitic stenosis  on top of a scoliosis in the lower lumbar spine with the apex at L3 and  severe degenerative changes, particularly on the right side at L3-L4 and  L4-L5. Myelogram demonstrated the patient had a high grade blocks at L3-  L4 and L4-L5 despite the placement of the X-Stop. She was advised  regarding the need for surgical decompression and stabilization.   DESCRIPTION OF PROCEDURE:  The patient was brought to the operating room  supine on a stretcher. After the smooth induction of general  endotracheal anesthesia, she was turned prone.  Her back was prepped  with alcohol  and DuraPrep and draped in a sterile fashion.  The  previously made midline incision was reopened and this was taken down to  the lumbodorsal fascia which was dissected on either side of midline to  expose the spinous processes of L3, L4, and L5 and then soon, the X-  Stops came into view. The hex screws on the X-Stops were loosened and  the X-Stops were then removed.  The interlaminar spaces were then  cleared of soft tissue and there noted be significantly bulbous facet  joints that had marked sclerotic change due to the spondylitic disease  that was present.  These were uncovered and the fascia overlying the L3-  L4 and the L4-L5 facets was completely uncovered. Complete laminectomy  of L4 was then undertaken removing the inferior facet at L4-L5 and the  inferior facets at L3-L4 were removed, also. The lamina was  substantially undercut from the L3 vertebra and the L3-L4 disc space was  exposed. The L3 nerve root was exposed, particularly on the right side  where there was a severe stenosis. The nerve root was decompressed by  removing overgrown bone from atop  of this area.  The disc, itself, was  noted to be bulging into the nerve root causing a considerable amount of  the stenotic process.  This was decompressed by opening the disc space  and removing a substantial quantity of severely degenerated disc  material. Completely, discectomy was then performed from the right side  and then also completed from the left side.  There was a moderate degree  of lateral recess stenosis, also. At L4-L5, a similar procedure was  carried out and similar finding to recapitulate themselves and  decompressed the L4 nerve root and the L5 nerve root on that right side.  The left side was then performed with a discectomy being taken to  completion so as to allow good placement of bone graft.  The endplates  were then curettaged and prepared for bone grafting and ultimately on  the right side at L4-L5, a  13 mm spacer was placed, at L3-L4 a 12-mm  spacer was placed, on the left side, an 8 mm spacer was placed at L3-L4  and a 12 mm spacer was placed at L4-L5.  Once these were tamped and  seated into position, the remainder of the disc space was filled with  bone graft, this being combination of autograft, allograft with 5 mL of  Osteocel bone. Then, pedicle entry sites were chosen at L3, L4 and L5  with fluoroscopic guidance. Six pedicle screws, all measuring four 6.5 x  45 mm years in size, were placed into L3, L4, and L5.  When their  position was finally checked radiographically and found to be secure,  the screws were connected with rods.  They were pre-contoured 7-mm rods.  A slight amount of distraction was placed on the right side of the  construct, a slight amount of compression was placed on the left side of  the construct.  An inter-transverse connector was then placed between  the two and the system was torqued down into a final locking position.  Care was then taken to make sure that each of the L3, L4, and the L5  nerve roots were well decompressed and when this was ascertained, the  remainder of the bone graft was laid into the lateral gutters which had  been previously decorticated when the exposure was obtained.  Once the  bone graft was laid into position, two pieces of Healos sponge were laid  over the bone graft and then the retractors were removed. Care was taken  to the again inspect that the nerve roots were all decompressed.  No  spinal fluid leakage occurred and when good hemostasis was achieved in  the soft tissues, the lumbodorsal fascia was closed with #1 Vicryl in an  interrupted fashion, 2-0 Vicryl was used in the subcutaneous and  subcuticular tissues, 3-0 Vicryl was used to close the subcuticular  skin.  The patient was then returned to the recovery room in stable  condition.  Blood loss estimated at 800 mL, 300 mL of Cell Saver blood was returned to the  patient.      Stefani Dama, M.D.  Electronically Signed     HJE/MEDQ  D:  10/18/2006  T:  10/18/2006  Job:  [id number]

## 2010-11-20 NOTE — H&P (Signed)
Angel Greene, Angel Greene                 ACCOUNT NO.:  [id number]   MEDICAL RECORD NO.:  [id number]          PATIENT TYPE:  EMS   LOCATION:  ED                           FACILITY:  [facility]   PHYSICIAN:  Darden Palmer., M.D.DATE OF BIRTH:  02-28-1930   DATE OF ADMISSION:  07/01/2004  DATE OF DISCHARGE:                                HISTORY & PHYSICAL   REASON FOR ADMISSION:  Chest pain.   HISTORY:  This 77 year old female has previously been healthy, except for a  previous history of breast cancer.  She had a hip fracture 5 years ago and  had some deep venous thrombosis following this and recurred when she was  taken off of Coumadin and has been on chronic Coumadin therapy since then.  She recently developed some urinary retention and was found to have  microhematuria.  She had a recent CT scan that showed a small cyst in the  posterior right kidney and mild hyperplasia of the adrenal glands.  She was  in her usual state of health until she awakened at 3 a.m. this morning with  lower sternal pain that radiated into both ribs.  The discomfort was fairly  intense and she sat up for about an hour and then called her husband, who  brought her to the [facility] Emergency Room.  An EKG showed PACs and minor  nonspecific changes.  The discomfort gradually abated after a total duration  of around 4 or 5 hours and she is currently pain-free.  Initial cardiac  enzymes were negative.  The pain was mildly pleuritic when she first came.  She did not have shortness of breath, but did have some mild nausea with it  and the pain did not radiate except to the lower ribs.  She has never had  any previous exercise intolerance or chest pain previously.   PAST MEDICAL HISTORY:  Past medical history is negative for hypertension,  diabetes or hyperlipidemia.  She does have a previous history of breast  cancer treated with surgery, radiation therapy and chemotherapy, which was a  combination of Taxol,  Cytoxan and some other agent.   PREVIOUS SURGICAL HISTORY:  Repair of hip fracture and a lumpectomy.   ALLERGIES:  None.   CURRENT MEDICATIONS:  1.  Coumadin.  2.  Triamterene/hydrochlorothiazide 37.5/25 mg daily.  3.  Motrin 7.5 mg daily.  4.  Femara 2.5 mg daily.   FAMILY HISTORY:  Father died of a stroke at age 34.  Mother died of breast  cancer.  She had 2 brothers die, 1 accidentally and 1 brother died following  some heart disease.   SOCIAL HISTORY:  She is married.  She and her husband have no children.  They attend [employer].  She is a nonsmoker and does not use alcohol to  excess.   REVIEW OF SYSTEMS:  She has had a mild weight gain over the past few years.  She has no skin disorder; no eye, ear, nose or throat problems; no  difficulty swallowing; normally no indigestion, dyspepsia or fatty food  intolerance; no diarrhea  or hematochezia.  She does have some urinary  frequency and also has some microhematuria, and has been seen by Dr. Dennison Nancy. Kimbrough recently.  She complains of arthritis that involves her knees  occasionally and occasionally her shoulders.  She has had no history of  stroke or TIA.  No bleeding disorders.  Other than as noted above, the  remainder of the review of systems is unremarkable.   PHYSICAL EXAMINATION:  GENERAL:  On examination, she is a pleasant white  female who is currently in no acute distress.  VITAL SIGNS:  Her blood pressure is 120/80.  Pulse was 76 with irregular  beats noted.  SKIN:  Skin is warm and dry.  EENT:  EOMI.  PERRLA.  C&S clear.  Fundi not examined.  Pharynx negative.  NECK:  Neck supple without masses, JVD, thyromegaly or bruits.  LUNGS:  Lungs clear to A&P.  BREASTS:  Breasts not examined.  CARDIAC:  Normal S1 and S2.  No S3, S4 or murmur.  ABDOMEN:  Abdomen is soft and nontender.  There is no mass,  hepatosplenomegaly or aneurysm noted.  EXTREMITIES:  Femoral and distal pulses are 2+.  There is trace edema  noted.  There is no varicosity or Homans sign noted.  NEUROLOGIC:  Normal.   LABORATORY AND ACCESSORY CLINICAL DATA:  EKG shows minor nonspecific ST  changes and PACs.   Chest x-ray was normal.   CBC, chemistry panel and initial cardiac enzymes were normal.   IMPRESSION:  1.  Isolated prolonged episode of chest discomfort with 3 sets of negative      cardiac enzymes.  2.  History of deep venous thrombosis, treated with chronic Coumadin.  3.  History of breast cancer, treated with lumpectomy, radiation therapy and      chemotherapy.  4.  Mild obesity.   RECOMMENDATIONS:  Obtain gallbladder ultrasound and chest CT scan.  Admit to  telemetry bed.  Check serial enzymes.  Begin Protonix.  Treat with aspirin  and beta blockers.  Continue Coumadin for the time-being.     Kristine Royal   WST/MEDQ  D:  07/01/2004  T:  07/01/2004  Job:  [id number]   cc:   [name], MD  Fax: [phone]   Drue Second, MD  Fax: [phone]

## 2010-11-20 NOTE — Discharge Summary (Signed)
[name], Angel Greene                 ACCOUNT NO.:  [id number]   MEDICAL RECORD NO.:  [id number]          PATIENT TYPE:  INP   LOCATION:  0357                         FACILITY[facility]   PHYSICIAN:  Darden Palmer., Angel GreeneDATE OF BIRTH:  08/08/1929   DATE OF ADMISSION:  07/01/2004  DATE OF DISCHARGE:  07/02/2004                                 DISCHARGE SUMMARY   FINAL DIAGNOSES:  1.  Prolonged chest pain of undetermined etiology, myocardial infarction      ruled out.  Significant gallstones and gallbladder wall thickening seen      on gallbladder ultrasound.  2.  History of breast cancer.  3.  Chronic Coumadin anticoagulation.  4.  History of deep vein thrombosis.   PROCEDURES:  CT of chest, gallbladder ultrasound.   HISTORY OF PRESENT ILLNESS:  This 77 year old female has a history of breast  cancer treated with radiation therapy, lumpectomy and chemotherapy six years  ago.  She also has a history of deep venous thrombosis treated with Coumadin  previously.  She has previously been in good health but awoke at 3 a.m. with  lower sternal chest discomfort radiating to her lower ribs.  It was mildly  pleuritic.  She had some mild nausea with this.  She was brought to the  emergency room and an EKG was unremarkable.  Initial cardiac markers were  negative, and her pain abated spontaneously.  She was admitted to rule out a  myocardial infarction.  Please see the previously dictated history and  physical for the remainder of the details.   LABORATORY DATA:  TSH is 1.419.  Chemistry panel showed normal liver  enzymes.  Potassium is 3.5.  Glucose is 109.  CPK, MB and troponins were all  normal.  EKG was normal.  CBC was normal, and the white count was not  elevated.  Gallbladder ultrasound showed multiple gallstones present in the  gallbladder and she is thought to have some mild thickening of the  gallbladder wall and a small amount of pericholecystic fluid.  The  gallbladder was  normal.  The abdominal aorta was normal.  CT angiogram  showed no pulmonary emboli or aortic dissection.  There was mild  bronchiectasis in the right lower lobe and left middle lobe.  She has had a  prior left axillary node dissection.  There was gallbladder wall thickening  with scarring anteriorly in the left upper lobe and obviously  bronchiectasis.  The patient was admitted and continued on her Coumadin.  She remained pain-free and had some very minimal pleuritic chest pain the  next day and it resolved.  I requested a surgical consult with Dr. Johna Greene,  and he was to see her at the time of discharge.  Because she had previously  been on Coumadin, I thought that she could be discharge to have her surgery  as an outpatient with Dr. Johna Greene though it needed to be done sooner.   DISCHARGE CONDITION:  She is discharged in improved condition.   DISCHARGE MEDICATIONS:  1.  Coumadin per her previous dosage.  2.  Maxzide 25  mg daily.  3.  Motrin 7.5 mg daily.  4.  Femara 2.5 daily.   FOLLOWUP:  I would like her to follow up with an Adenosine Cardiolite scan  at my office in one week.  She is to report any recurrent chest pain to [place].     Angel Greene   WST/MEDQ  D:  07/02/2004  T:  07/02/2004  Job:  [id number]   cc:   Angel Greene, M.D.  [address]  [place]  [place] [address]  Fax: [phone]   Angel Greene, Angel Greene  Fax: [phone]   Angel Greene

## 2010-11-20 NOTE — Consult Note (Signed)
Angel Greene, Angel Greene                 ACCOUNT NO.:  [id number]   MEDICAL RECORD NO.:  [id number]          PATIENT TYPE:  INP   LOCATION:  0357                         FACILITY:  [facility]   PHYSICIAN:  Sharlet Salina T. Hoxworth, M.D.DATE OF BIRTH:  1930-03-23   DATE OF CONSULTATION:  07/02/2004  DATE OF DISCHARGE:                                   CONSULTATION   REASON FOR CONSULTATION:  Abdominal pain.   HISTORY OF PRESENT ILLNESS:  I was asked by Dr. Georga Hacking to  evaluate this patient.  She is a 77 year old white female who was in her  usual state of reasonably good health until the early morning hours of  July 01, 2004, when she was awakened from sleep with bilateral subcostal  pain.  This was described as a constant, squeezing-like pain. There was mild  associated nausea, but no vomiting.  No shortness of breath or palpitations.  She had no had any previous episodes of pain.  It was not markedly severe,  but was persistent, and she presented to the [facility] Emergency  Room, and was evaluated by Dr. Donnie Aho.  The pain spontaneously resolved in  the emergency room during evaluation, after about five hours of duration.  She has had no recurrence of her pain over the past 24 hours, and is  currently entirely asymptomatic.  She had an extensive workup, and a cardiac  source was ruled out.  An ultrasound has revealed gallstones, as below.  She  has not had any chronic GI complaints, specifically no dyspepsia, reflux,  change in bowel habits, nausea, abdominal pain, melena, or hematochezia.  She has not had any previous abdominal surgery.   PAST MEDICAL HISTORY:  1.  History of cancer of the left breast approximately five years ago,      status post lumpectomy, radiation, chemotherapy and now on adjuvant      Femora.  2.  She has had a ORIF of a hip fracture.  3.  She had an episode of deep venous thrombosis following her hip fracture,      and then recurrent deep venous  thrombosis, off of Coumadin and now is on      long-term anticoagulation.   CURRENT MEDICATIONS:  1.  Coumadin 5 mg, alternating with 7.5 mg daily.  2.  Triamterene/hydrochlorothiazide  50/25 mg daily.  3.  Mobic 7.5 mg daily.  4.  Femora 2.5 mg daily.   ALLERGIES:  No known drug allergies.   SOCIAL HISTORY:  She is married.  Does not smoke cigarettes.  Drinks a rare  small amount of alcohol.   FAMILY HISTORY:  Father died of a stroke at an advanced age.  Mother died of  breast cancer.  Has a brother with a history of heart disease.   REVIEW OF SYSTEMS:  GENERAL:  No fever, chills, or weight change.  RESPIRATORY:  Denies shortness of breath, cough, or wheezing.  CARDIAC:  Denies palpitations or chest pain other than above.  ABDOMEN/GI:  As in the  history of present illness.  GENITOURINARY:  Positive for some  urinary  frequency and a history of micro-hematuria.  MUSCULOSKELETAL:  Positive for  some arthritic pain, particularly in her knees.  Also some mild chronic  lower extremity swelling.   PHYSICAL EXAMINATION:  VITAL SIGNS:  Temperature 98.6 degrees, pulse 65,  respirations 16, blood pressure 138/68.  GENERAL:  Alert, well-developed white female, in no acute distress.  SKIN:  Warm and dry without rash or infection.  HEENT:  No palpable mass or thyromegaly.  Sclerae anicteric.  Nares and  oropharynx clear.  NODES:  No cervical, supraclavicular, or axillary nodes palpable.  LUNGS:  Clear to auscultation without increased work of breathing or  wheezing.  CARDIOVASCULAR:  A regular rate and rhythm without murmurs.  Trace ankle  edema.  Peripheral pulses intact.  BREASTS:  No masses.  ABDOMEN:  Soft and without any tenderness.  No guarding.  There is a  reducible, approximately 0.5 cm umbilical hernia.  EXTREMITIES:  Trace edema of the lower extremities.  No deformity.  NEUROLOGIC:  Alert and oriented.  Gait is normal.   LABORATORY DATA:  I&R 1.4.  Cardiac panel has been  negative.  Liver function  tests and CBC all within normal limits.  Electrocardiogram is normal.  A CT angiography of the chest was performed with the patient's history of a  deep venous thrombosis, and was negative for a pulmonary embolus.  An abdominal ultrasound was reviewed which shows multiple stones and some  slight thickening of the gallbladder wall.  Normal common bile duct.   ASSESSMENT/PLAN:  66.  A 77 year old white female with an episode of typical biliary colic and      gallstones, confirmed on ultrasound.  2.  She is on chronic anticoagulation for recurrent deep venous thrombosis.  3.  She has a history of breast cancer, on Femora.  The patient will require an elective cholecystectomy.  She currently is  asymptomatic, and this can be done electively.  The patient is to be  discharged.  We will schedule her surgery with Dr. Vikki Ports,  who  follows her regularly for her breast cancer.  Will need to stop her Coumadin  obviously preoperatively, and this will be arranged.  She is also having a  Cardiolite test within the next two weeks by Dr. Donnie Aho, and we will await  the results of this prior to surgery.     Benj   BTH/MEDQ  D:  07/02/2004  T:  07/02/2004  Job:  [id number]   cc:   [name], MD  Fax: [phone]   W. Ashley Royalty., M.D.  [address]  [place]  [place] [address]  Fax: [phone]   Vikki Ports, MD  [phone] [address], [address]  [place]  [place] [address]  Fax: [phone]

## 2010-11-20 NOTE — Op Note (Signed)
NAMEHARJIT, Angel Greene                 ACCOUNT NO.:  [id number]   MEDICAL RECORD NO.:  [id number]          PATIENT TYPE:  OIB   LOCATION:  1619                         FACILITY:  [facility]   PHYSICIAN:  Jene Every, M.D.    DATE OF BIRTH:  27-Sep-1929   DATE OF PROCEDURE:  09/23/2005  DATE OF DISCHARGE:                                 OPERATIVE REPORT   PREOPERATIVE DIAGNOSIS:  Degenerative scoliosis and spinal stenosis at L3-4  and L4-5.   POSTOPERATIVE DIAGNOSIS:  Degenerative scoliosis and spinal stenosis at L3-4  and L4-5.   PROCEDURE PERFORMED:  1.  Insertion of X STOP at L3-4 and L4-5.  2.  Partial excision of posterior element of L3-4 and L4-5.   ANESTHESIA:  General.   ASSISTANT:  Marlowe Kays, M.D.   BRIEF HISTORY/INDICATIONS:  This is a 77 year old with neurogenic  claudication secondary to spinal stenosis and degenerative scoliosis.  She  had been refractory to conservative treatment.  She was limited by her  neurogenic claudication.  This was significantly disabling to her.  We had  options of a decompression and concomitant fusion versus an insertion of an  X STOP.  Felt that this may increase the dimension of the intraspinal canal  as well as support her degenerative scoliosis.  It was less invasive.  The  risk of worsening degenerative scoliosis was avoided, in comparison to a  decompression.  We discussed the risks and benefits of the procedure,  including bleeding, infection, no changes in symptoms, worsening symptoms,  the need for decompression or fusion in the future, anesthetic  complications, DVT/PE, etc.   TECHNIQUE:  With the patient in supine position after the induction of  adequate general anesthesia, 1 gm of Kefzol, and TED hose, she was placed  prone on the Wilson frame and Climax table.  All bony prominences were well  padded.  The lumbar region was prepped and draped in the usual sterile  fashion.  Two 18 gauge spinal needles were utilized to  localize the 3-4 and  4-5 interspace and confirmed with x-ray.  I then made incisions from the  spinous processes of 3-5.  Subcutaneous tissue was dissected.  Electrocautery was utilized to achieve hemostasis.  On either side of the  midline and the interspinous ligament, I divided the paraspinous  musculature, preserving the soft tissue of the midline.  I then elevated the  paraspinous musculature of 3-4 and 4-5 and bilaterally, placing a self-  retaining retractor.  Facet hypertrophy was noted.  Used a Beyer rongeur to  remove minor projections of the facet and lamina for insertion of the X STOP  at 3-4 and 4-5.  At 3-4, we sized the X STOP after a series of dilators,  placing beneath the interspinous ligament from a right to left fashion,  parallel to the spinal column.  The dilator was then inserted, and then  tension occurred at 8 diameters; therefore, the 8 X STOP was selected.  The  X STOP was inserted left to right.  The fin was attached.  The fins  compressed as much medially  as possible.  There was not full compression  allowed due to the width of the X STOP.  We then torqued the set screw  appropriately.  Did feel that there was some distraction obtained here.  The  patient was in the flexed position already.  It was felt that with slight  distraction of the interspinous ligament, this would provide internal  support.  There was some rotation because of the scoliosis noted of the X  STOP, so we used some FiberWire and after multiple configurations,  determined the best possible to keep the fins from rotating.  This was  through-and-through the interspinous ligament and the X STOP.  Next,  attention was towards a lower segment.  This was on the lateral.  Noted a  scoliosis.  This was the segment where the lateral tilt was greatest.  With  multiple attempts, we placed a dilator through the interspinous process,  although more anteriorly due to the three-dimensional rotation.  I  was  unable to pass more anteriorly.  We then placed the X STOP as far anterior  as we could, but it was more posterior than the X STOP at 3-4.  Did provide  some distraction of the interspinous ligament, although again, because of  the configuration of the scoliosis and the lamina, it precluded further  anterior placement.  This too was then made after dilatation noted a  distraction at 8.  I then placed the X STOP in this position, secured it in  a similar fashion on the contralateral side, torquing the set screw and  suturing them with fiber wire.  We set it in the AP and lateral plane.  They  were satisfactory in the AP and in the lateral, again, a slightly posterior  placement of the 4-5, given the constraints of the anatomy.  We discussed  this with Dr. Simonne Come and agreed this was the optimal position that was  allowable, given her anatomy.  Electrocautery was utilized to achieve strict  hemostasis.  We used 0.25% Marcaine with epinephrine and paraspinous  musculature.  Then released the retraction and sewed the dorsal lumbar  fascia to the midline with a #1 Vicryl interrupted figure-of-eight sutures.  Subcutaneous tissue reapproximated with 2-0 Vicryl simple sutures.  The skin  was reapproximated with staples.  The wound was dressed sterilely.  She was  then placed supine on the hospital bed and extubated without difficulty.  Transported to the recovery room in satisfactory condition.   The patient tolerated the procedure well with no complications.  Minimal  blood loss.      Jene Every, M.D.  Electronically Signed     JB/MEDQ  D:  09/24/2005  T:  09/26/2005  Job:  [id number]

## 2010-11-20 NOTE — Discharge Summary (Signed)
[name], [name]                 ACCOUNT NO.:  [id number]   MEDICAL RECORD NO.:  [id number]          PATIENT TYPE:  INP   LOCATION:  3006                         FACILITY:  [facility]   PHYSICIAN:  Stefani Dama, M.D.  DATE OF BIRTH:  1929/08/14   DATE OF ADMISSION:  10/18/2006  DATE OF DISCHARGE:  10/22/2006                               DISCHARGE SUMMARY   ADMISSION DIAGNOSES:  Lumbar spondylosis and lumbar stenosis L3-L4, L4-  L5 with scoliosis, lumbar radiculopathy, status post X-Stop procedure L3-  4 and L4-5.   POSTOPERATIVE DIAGNOSES:  Lumbar spondylosis and lumbar stenosis L3-L4,  L4-L5 with scoliosis, lumbar radiculopathy, status post X-Stop procedure  L3-4 and L4-5.   OPERATION:  1. Laminectomy at L3 and L4.  2. Diskectomy, decompression of L3-4 and  L4-5 with decompression of      L3, L4 and L5 nerve roots.  3. Interbody arthrodesis with peak spacers.  4. Segmental fixation L3-L5.  5. Posterolateral arthrodesis.   CONDITION ON DISCHARGE:  Iimproved.   HOSPITAL COURSE:  Angel Greene is a 77 year old individual who was  treated elsewhere with the X-Stop procedure for spondylitic stenosis at  L3-4 and L4-5.  The patient had scoliosis and did not respond well to  treatment.  She was advised regarding surgical decompression  arthrodesis, and this was performed on October 18, 2006.  She tolerated  the procedure well.  She was maintained in the ICU for the first 48  hours.  She became independent with ambulation and required minimal pain  medications.  She and was then transferred to the floor.  During the  hospitalization she did not require any blood transfusion.  She has used  a minimal amount of pain medication.  At the current time she is  discharged to home with a prescription for Percocet #60 without refills.  A rolling walker with 5-inch wheels being provided.  She has been  advised as to her postoperative activities.  The incision is clean and  dry.   CONDITION ON  DISCHARGE:  Improving.      Stefani Dama, M.D.  Electronically Signed     HJE/MEDQ  D:  10/22/2006  T:  10/23/2006  Job:  [id number]

## 2011-02-15 ENCOUNTER — Encounter: Payer: Self-pay | Admitting: Podiatry

## 2011-03-18 ENCOUNTER — Telehealth: Payer: Self-pay | Admitting: *Deleted

## 2011-03-18 NOTE — Telephone Encounter (Signed)
Called patient and l/m on vmail for her to call and schedule Colon.

## 2011-06-04 NOTE — H&P (Signed)
H&P performed 06/04/11. Dictation # [id number]

## 2011-06-07 NOTE — H&P (Signed)
NAME:  Angel Greene, Angel Greene                      ACCOUNT NO.:  MEDICAL RECORD NO.:  [id number]  LOCATION:                                 FACILITY:  PHYSICIAN:  Ollen Gross, M.D.    DATE OF BIRTH:  01/19/1930  DATE OF ADMISSION: DATE OF DISCHARGE:                             HISTORY & PHYSICAL   ADMITTING DIAGNOSIS:  Osteoarthritis, right knee.  HISTORY OF PRESENT ILLNESS:  This is a 77 year old lady with a history of osteoarthritis of the right knee that has failed conservative treatment to alleviate her symptoms.  After discussion of treatments, benefits, risks, and options, the patient is now scheduled for total knee arthroplasty of the right knee.  Her medical doctor is Dr. Timothy Lasso, her neurologist is [name], and her cardiologist is Dr. Donnie Aho.  She plans on going home after surgery, but if not doing well, may need a short stay in a skilled nursing facility.  DRUG ALLERGIES:  None.  CURRENT MEDICATIONS: 1. Meloxicam 15 mg daily. 2. Hydrochlorothiazide 25 mg daily. 3. Nortriptyline 10 mg 3 p.o. q.h.s. 4. ReQuip XL 12 mg 1 q.h.s. 5. Hydrochlorothiazide 25 mg 1 daily. 6. Ultram 50 mg 1 q.4 h. p.r.n. pain. 7. Klonopin 0.5 mg one half tablet q.h.s. 8. Sinemet 25/100 mg one half tablet b.i.d. 9. The patient also takes fish oil 1000 mg 1 daily. 10.Caltrate 600 plus D 1 daily.  SERIOUS MEDICAL ILLNESSES: 1. Hypertension. 2. Restless legs syndrome. 3. Parkinson's. 4. History of DVT in 1996 after breast surgery for breast cancer.  PREVIOUS SURGERIES:  Cholecystectomy, breast surgery for breast cancer in 1996, 3 back surgeries, and hip surgery.  FAMILY HISTORY:  Positive for heart attack and cancer.  SOCIAL HISTORY:  The patient is married.  She lives at home.  She does not smoke and does not drink.  REVIEW OF SYSTEMS:  CENTRAL NERVOUS SYSTEM:  Positive for history of shingles and cataracts, also for history of Parkinson's.  PULMONARY: Negative for shortness of breath, PND,  and orthopnea.  No sleep apnea. CARDIOVASCULAR:  Negative for chest pain or palpitation. GASTROINTESTINAL:  Negative for ulcers, hepatitis.  GENITOURINARY: Negative for urinary tract difficulties.  MUSCULOSKELETAL:  Positive in HPI.  Again, note the patient did have a history of DVT in 1996, status post breast cancer surgery and was on Coumadin thereafter, but has had no further difficulties with that since then.  PHYSICAL EXAMINATION:  GENERAL:  This is a well-developed, well- nourished lady, in no acute distress. VITAL SIGNS:  Blood pressure 155/88, respirations 16, pulse 88 and regular. HEENT:  Head is normocephalic.  Nose patent.  Ears patent.  Pupils equal, round, and reactive to light.  Throat without injection. NECK:  Supple without adenopathy.  Carotids 2+ without bruit. CHEST:  Clear to auscultation.  No rales or rhonchi.  Respirations 16. HEART:  Regular rate and rhythm at 88 beats per minute without murmur. ABDOMEN:  Soft with active bowel sounds.  No masses or organomegaly. NEUROLOGIC:  The patient is alert and oriented to time, place, and person.  Cranial nerves II through XII grossly intact. EXTREMITIES:  The right knee with a 3 degree  flexion contracture, further flexion to 120 degrees.  Neurovascular status intact.  IMPRESSION:  Osteoarthritis, right knee.  PLAN:  Total knee arthroplasty, right knee.     Jaquelyn Bitter. Cardelia Sassano, P.A.   ______________________________ Ollen Gross, M.D.    SJC/MEDQ  D:  06/04/2011  T:  06/05/2011  Job:  [id number]

## 2011-06-18 ENCOUNTER — Encounter (HOSPITAL_COMMUNITY): Payer: Self-pay | Admitting: Pharmacy Technician

## 2011-06-21 ENCOUNTER — Encounter (HOSPITAL_COMMUNITY)
Admission: RE | Admit: 2011-06-21 | Discharge: 2011-06-21 | Disposition: A | Discharge status: Home / Self Care | Payer: Medicare Other | Source: Ambulatory Visit | Attending: Orthopedic Surgery | Admitting: Orthopedic Surgery

## 2011-06-21 ENCOUNTER — Ambulatory Visit (HOSPITAL_COMMUNITY)
Admission: RE | Admit: 2011-06-21 | Discharge: 2011-06-21 | Disposition: A | Discharge status: Home / Self Care | Payer: Medicare Other | Source: Ambulatory Visit | Attending: Orthopedic Surgery | Admitting: Orthopedic Surgery

## 2011-06-21 ENCOUNTER — Encounter (HOSPITAL_COMMUNITY): Payer: Self-pay

## 2011-06-21 DIAGNOSIS — Z01818 Encounter for other preprocedural examination: Secondary | ICD-10-CM | POA: Insufficient documentation

## 2011-06-21 DIAGNOSIS — Z01812 Encounter for preprocedural laboratory examination: Secondary | ICD-10-CM | POA: Insufficient documentation

## 2011-06-21 HISTORY — DX: Unspecified osteoarthritis, unspecified site: M19.90

## 2011-06-21 HISTORY — DX: Cystitis, unspecified without hematuria: N30.90

## 2011-06-21 HISTORY — DX: Personal history of other diseases of the digestive system: Z87.19

## 2011-06-21 HISTORY — DX: Essential (primary) hypertension: I10

## 2011-06-21 HISTORY — DX: Peripheral vascular disease, unspecified: I73.9

## 2011-06-21 LAB — URINALYSIS, ROUTINE W REFLEX MICROSCOPIC
Bilirubin Urine: NEGATIVE
Ketones, ur: NEGATIVE mg/dL
Protein, ur: NEGATIVE mg/dL
Specific Gravity, Urine: 1.016 (ref 1.005–1.030)
pH: 6.5 (ref 5.0–8.0)

## 2011-06-21 LAB — URINE MICROSCOPIC-ADD ON

## 2011-06-21 LAB — DIFFERENTIAL
Eosinophils Absolute: 0.2 10*3/uL (ref 0.0–0.7)
Lymphocytes Relative: 31 % (ref 12–46)
Lymphs Abs: 1.7 10*3/uL (ref 0.7–4.0)
Monocytes Relative: 7 % (ref 3–12)
Neutro Abs: 3.2 10*3/uL (ref 1.7–7.7)
Neutrophils Relative %: 59 % (ref 43–77)

## 2011-06-21 LAB — CBC
Hemoglobin: 15 g/dL (ref 12.0–15.0)
MCHC: 33.1 g/dL (ref 30.0–36.0)
RBC: 4.9 MIL/uL (ref 3.87–5.11)

## 2011-06-21 LAB — PROTIME-INR
INR: 0.94 (ref 0.00–1.49)
Prothrombin Time: 12.8 seconds (ref 11.6–15.2)

## 2011-06-21 LAB — APTT: aPTT: 27 seconds (ref 24–37)

## 2011-06-21 LAB — TYPE AND SCREEN: Antibody Screen: NEGATIVE

## 2011-06-21 MED ORDER — CHLORHEXIDINE GLUCONATE 4 % EX LIQD: 60.0000 mL | Freq: Once | CUTANEOUS | Status: DC

## 2011-06-21 NOTE — Patient Instructions (Signed)
20 Angel Greene  06/21/2011   Your procedure is scheduled on: 06/23/11    Surgery   [id number]   Wednesday Report to [facility] at 0930 AM.  Call this number if you have problems the morning of surgery: [phone]     Or [name]   PST [phone]              CALL DR ALUISIOS OFFICE TODAY REGARDING ASPIRIN  Remember:   Do not eat food:After Midnight.     Tuesday night  May have clear liquids:until Midnight .  Tuesday NIGHT  Clear liquids include soda, tea, black coffee, apple or grape juice, broth.  Take these medicines the morning of surgery with A SIP OF WATER:   NONE     Do not wear jewelry, make-up or nail polish.  Do not wear lotions, powders, or perfumes. You may wear deodorant.  Do not shave 48 hours prior to surgery.  Do not bring valuables to the hospital.  Contacts, dentures or bridgework may not be worn into surgery.  Leave suitcase in the car. After surgery it may be brought to your room.  For patients admitted to the hospital, checkout time is 11:00 AM the day of discharge.   Patients discharged the day of surgery will not be allowed to drive home.  Name and phone number of your driver:  Rehab or husband Special Instructions: CHG Shower Use Special Wash: 1/2 bottle night before surgery and 1/2 bottle morning of surgery.  REGULAR SOAP FACE AND PRIVATES   Please read over the following fact sheets that you were given: MRSA Information

## 2011-06-21 NOTE — Pre-Procedure Instructions (Signed)
CBC and BMET from 06/10/11 on CHART

## 2011-06-21 NOTE — Pre-Procedure Instructions (Signed)
Instructed to call office regarding aspirin- is still taking

## 2011-06-22 MED ORDER — BUPIVACAINE 0.25 % ON-Q PUMP SINGLE CATH 300ML
300.0000 mL | INJECTION | Status: DC
  Filled 2011-06-22: qty 300

## 2011-06-23 ENCOUNTER — Inpatient Hospital Stay (HOSPITAL_COMMUNITY)
Admission: RE | Admit: 2011-06-23 | Discharge: 2011-06-28 | DRG: 470 | Disposition: A | Discharge status: Skilled Nursing Facility | Payer: Medicare Other | Source: Ambulatory Visit | Attending: Orthopedic Surgery | Admitting: Orthopedic Surgery

## 2011-06-23 ENCOUNTER — Encounter (HOSPITAL_COMMUNITY)
Admission: RE | Disposition: A | Discharge status: Skilled Nursing Facility | Payer: Self-pay | Source: Ambulatory Visit | Attending: Orthopedic Surgery

## 2011-06-23 ENCOUNTER — Encounter (HOSPITAL_COMMUNITY): Payer: Self-pay | Admitting: Anesthesiology

## 2011-06-23 ENCOUNTER — Inpatient Hospital Stay (HOSPITAL_COMMUNITY): Payer: Medicare Other | Admitting: Anesthesiology

## 2011-06-23 ENCOUNTER — Encounter (HOSPITAL_COMMUNITY): Payer: Self-pay | Admitting: *Deleted

## 2011-06-23 DIAGNOSIS — I428 Other cardiomyopathies: Secondary | ICD-10-CM | POA: Diagnosis present

## 2011-06-23 DIAGNOSIS — Z853 Personal history of malignant neoplasm of breast: Secondary | ICD-10-CM

## 2011-06-23 DIAGNOSIS — I4891 Unspecified atrial fibrillation: Secondary | ICD-10-CM

## 2011-06-23 DIAGNOSIS — Z86718 Personal history of other venous thrombosis and embolism: Secondary | ICD-10-CM

## 2011-06-23 DIAGNOSIS — M179 Osteoarthritis of knee, unspecified: Secondary | ICD-10-CM

## 2011-06-23 DIAGNOSIS — Z96649 Presence of unspecified artificial hip joint: Secondary | ICD-10-CM

## 2011-06-23 DIAGNOSIS — E669 Obesity, unspecified: Secondary | ICD-10-CM | POA: Diagnosis present

## 2011-06-23 DIAGNOSIS — G20 Parkinson's disease: Secondary | ICD-10-CM | POA: Diagnosis present

## 2011-06-23 DIAGNOSIS — Z96659 Presence of unspecified artificial knee joint: Secondary | ICD-10-CM

## 2011-06-23 DIAGNOSIS — R410 Disorientation, unspecified: Secondary | ICD-10-CM

## 2011-06-23 DIAGNOSIS — I119 Hypertensive heart disease without heart failure: Secondary | ICD-10-CM

## 2011-06-23 DIAGNOSIS — M1710 Unilateral primary osteoarthritis, unspecified knee: Principal | ICD-10-CM | POA: Diagnosis present

## 2011-06-23 DIAGNOSIS — G20A1 Parkinson's disease without dyskinesia, without mention of fluctuations: Secondary | ICD-10-CM

## 2011-06-23 DIAGNOSIS — R000 Tachycardia, unspecified: Secondary | ICD-10-CM

## 2011-06-23 HISTORY — PX: TOTAL KNEE ARTHROPLASTY: SHX125

## 2011-06-23 HISTORY — DX: Personal history of other venous thrombosis and embolism: Z86.718

## 2011-06-23 SURGERY — ARTHROPLASTY, KNEE, TOTAL
Anesthesia: General | Site: Knee | Laterality: Right | Wound class: Clean

## 2011-06-23 MED ORDER — HYDROCHLOROTHIAZIDE 25 MG PO TABS
25.0000 mg | ORAL_TABLET | Freq: Every day | ORAL | Status: DC
  Administered 2011-06-24 – 2011-06-25 (×2): 25 mg via ORAL
  Filled 2011-06-23 (×2): qty 1

## 2011-06-23 MED ORDER — ENOXAPARIN SODIUM 30 MG/0.3ML ~~LOC~~ SOLN
30.0000 mg | Freq: Two times a day (BID) | SUBCUTANEOUS | Status: DC
  Administered 2011-06-24 – 2011-06-28 (×9): 30 mg via SUBCUTANEOUS
  Filled 2011-06-23 (×13): qty 0.3

## 2011-06-23 MED ORDER — OXYCODONE HCL 5 MG PO TABS
5.0000 mg | ORAL_TABLET | ORAL | Status: DC | PRN
  Filled 2011-06-23: qty 1

## 2011-06-23 MED ORDER — ROCURONIUM BROMIDE 100 MG/10ML IV SOLN
INTRAVENOUS | Status: DC | PRN
  Administered 2011-06-23: 50 mg via INTRAVENOUS

## 2011-06-23 MED ORDER — TEMAZEPAM 15 MG PO CAPS: 15.0000 mg | ORAL_CAPSULE | Freq: Every evening | ORAL | Status: DC | PRN

## 2011-06-23 MED ORDER — MORPHINE SULFATE (PF) 1 MG/ML IV SOLN
INTRAVENOUS | Status: DC
  Administered 2011-06-23: 15:00:00 via INTRAVENOUS
  Filled 2011-06-23: qty 25

## 2011-06-23 MED ORDER — LACTATED RINGERS IV SOLN: INTRAVENOUS | Status: DC

## 2011-06-23 MED ORDER — ACETAMINOPHEN 10 MG/ML IV SOLN
INTRAVENOUS | Status: DC | PRN
  Administered 2011-06-23: 1000 mg via INTRAVENOUS

## 2011-06-23 MED ORDER — DIPHENHYDRAMINE HCL 50 MG/ML IJ SOLN: 12.5000 mg | Freq: Four times a day (QID) | INTRAMUSCULAR | Status: DC | PRN

## 2011-06-23 MED ORDER — WARFARIN VIDEO
Freq: Once | Status: AC
  Administered 2011-06-24: 12:00:00

## 2011-06-23 MED ORDER — ACETAMINOPHEN 325 MG PO TABS
650.0000 mg | ORAL_TABLET | Freq: Four times a day (QID) | ORAL | Status: DC | PRN
  Administered 2011-06-24: 650 mg via ORAL
  Filled 2011-06-23: qty 2

## 2011-06-23 MED ORDER — METHOCARBAMOL 500 MG PO TABS: 500.0000 mg | ORAL_TABLET | Freq: Four times a day (QID) | ORAL | Status: DC | PRN

## 2011-06-23 MED ORDER — CEFAZOLIN SODIUM 1-5 GM-% IV SOLN
1.0000 g | Freq: Four times a day (QID) | INTRAVENOUS | Status: AC
  Administered 2011-06-23 – 2011-06-24 (×3): 1 g via INTRAVENOUS
  Filled 2011-06-23 (×3): qty 50

## 2011-06-23 MED ORDER — TRAMADOL HCL 50 MG PO TABS
50.0000 mg | ORAL_TABLET | Freq: Four times a day (QID) | ORAL | Status: DC | PRN
  Administered 2011-06-24: 50 mg via ORAL
  Filled 2011-06-23: qty 1

## 2011-06-23 MED ORDER — NEOSTIGMINE METHYLSULFATE 1 MG/ML IJ SOLN
INTRAMUSCULAR | Status: DC | PRN
  Administered 2011-06-23: 3 mg via INTRAVENOUS

## 2011-06-23 MED ORDER — BUPIVACAINE 0.25 % ON-Q PUMP SINGLE CATH 300ML
INJECTION | Status: DC | PRN
  Administered 2011-06-23: 300 mL

## 2011-06-23 MED ORDER — QUETIAPINE 12.5 MG HALF TABLET
12.5000 mg | ORAL_TABLET | Freq: Every day | ORAL | Status: DC
  Administered 2011-06-23 – 2011-06-27 (×5): 12.5 mg via ORAL
  Filled 2011-06-23 (×8): qty 1

## 2011-06-23 MED ORDER — DEXTROSE-NACL 5-0.45 % IV SOLN
INTRAVENOUS | Status: DC
  Administered 2011-06-23: 21:00:00 via INTRAVENOUS

## 2011-06-23 MED ORDER — METHOCARBAMOL 100 MG/ML IJ SOLN
500.0000 mg | Freq: Four times a day (QID) | INTRAVENOUS | Status: DC | PRN
  Filled 2011-06-23: qty 5

## 2011-06-23 MED ORDER — COUMADIN BOOK
Freq: Once | Status: AC
  Administered 2011-06-23: 21:00:00
  Filled 2011-06-23: qty 1

## 2011-06-23 MED ORDER — DIPHENHYDRAMINE HCL 12.5 MG/5ML PO ELIX: 12.5000 mg | ORAL_SOLUTION | ORAL | Status: DC | PRN

## 2011-06-23 MED ORDER — LACTATED RINGERS IV SOLN
INTRAVENOUS | Status: DC
  Administered 2011-06-23: 14:00:00 via INTRAVENOUS
  Administered 2011-06-23: 1000 mL via INTRAVENOUS

## 2011-06-23 MED ORDER — METOCLOPRAMIDE HCL 5 MG/ML IJ SOLN: 5.0000 mg | Freq: Three times a day (TID) | INTRAMUSCULAR | Status: DC | PRN

## 2011-06-23 MED ORDER — CARBIDOPA-LEVODOPA 25-100 MG PO TABS
0.5000 | ORAL_TABLET | Freq: Two times a day (BID) | ORAL | Status: DC
  Administered 2011-06-23 – 2011-06-28 (×10): 0.5 via ORAL
  Filled 2011-06-23 (×13): qty 0.5

## 2011-06-23 MED ORDER — ONDANSETRON HCL 4 MG/2ML IJ SOLN: 4.0000 mg | Freq: Four times a day (QID) | INTRAMUSCULAR | Status: DC | PRN

## 2011-06-23 MED ORDER — GLYCOPYRROLATE 0.2 MG/ML IJ SOLN
INTRAMUSCULAR | Status: DC | PRN
  Administered 2011-06-23: .04 mg via INTRAVENOUS

## 2011-06-23 MED ORDER — DOCUSATE SODIUM 100 MG PO CAPS
100.0000 mg | ORAL_CAPSULE | Freq: Two times a day (BID) | ORAL | Status: DC
  Administered 2011-06-23 – 2011-06-28 (×10): 100 mg via ORAL
  Filled 2011-06-23 (×12): qty 1

## 2011-06-23 MED ORDER — ONDANSETRON HCL 4 MG PO TABS: 4.0000 mg | ORAL_TABLET | Freq: Four times a day (QID) | ORAL | Status: DC | PRN

## 2011-06-23 MED ORDER — ACETAMINOPHEN 650 MG RE SUPP: 650.0000 mg | Freq: Four times a day (QID) | RECTAL | Status: DC | PRN

## 2011-06-23 MED ORDER — METOCLOPRAMIDE HCL 10 MG PO TABS: 5.0000 mg | ORAL_TABLET | Freq: Three times a day (TID) | ORAL | Status: DC | PRN

## 2011-06-23 MED ORDER — CEFAZOLIN SODIUM 1-5 GM-% IV SOLN
INTRAVENOUS | Status: AC
  Filled 2011-06-23: qty 50

## 2011-06-23 MED ORDER — ONDANSETRON HCL 4 MG/2ML IJ SOLN
INTRAMUSCULAR | Status: DC | PRN
  Administered 2011-06-23: 4 mg via INTRAVENOUS

## 2011-06-23 MED ORDER — NORTRIPTYLINE HCL 10 MG PO CAPS
30.0000 mg | ORAL_CAPSULE | Freq: Every day | ORAL | Status: DC
  Administered 2011-06-23 – 2011-06-25 (×3): 30 mg via ORAL
  Administered 2011-06-26: 10 mg via ORAL
  Administered 2011-06-27: 30 mg via ORAL
  Filled 2011-06-23 (×8): qty 3

## 2011-06-23 MED ORDER — LIDOCAINE HCL (CARDIAC) 20 MG/ML IV SOLN
INTRAVENOUS | Status: DC | PRN
  Administered 2011-06-23: 50 mg via INTRAVENOUS

## 2011-06-23 MED ORDER — PROPOFOL 10 MG/ML IV BOLUS
INTRAVENOUS | Status: DC | PRN
  Administered 2011-06-23: 160 mg via INTRAVENOUS

## 2011-06-23 MED ORDER — NALOXONE HCL 0.4 MG/ML IJ SOLN: 0.4000 mg | INTRAMUSCULAR | Status: DC | PRN

## 2011-06-23 MED ORDER — CEFAZOLIN SODIUM-DEXTROSE 2-3 GM-% IV SOLR
2.0000 g | Freq: Once | INTRAVENOUS | Status: AC
  Administered 2011-06-23: 2 g via INTRAVENOUS

## 2011-06-23 MED ORDER — BUPIVACAINE ON-Q PAIN PUMP (FOR ORDER SET NO CHG)
INJECTION | Status: DC
  Filled 2011-06-23: qty 1

## 2011-06-23 MED ORDER — FENTANYL CITRATE 0.05 MG/ML IJ SOLN
INTRAMUSCULAR | Status: DC | PRN
  Administered 2011-06-23: 50 ug via INTRAVENOUS
  Administered 2011-06-23 (×2): 75 ug via INTRAVENOUS
  Administered 2011-06-23: 100 ug via INTRAVENOUS

## 2011-06-23 MED ORDER — LACTATED RINGERS IV SOLN: INTRAVENOUS | Status: DC | PRN

## 2011-06-23 MED ORDER — SODIUM CHLORIDE 0.9 % IJ SOLN: 9.0000 mL | INTRAMUSCULAR | Status: DC | PRN

## 2011-06-23 MED ORDER — ROPINIROLE HCL ER 12 MG PO TB24
1.0000 | ORAL_TABLET | Freq: Every day | ORAL | Status: DC
  Administered 2011-06-24 – 2011-06-27 (×4): 12 mg via ORAL

## 2011-06-23 MED ORDER — CLONAZEPAM 0.5 MG PO TABS
0.2500 mg | ORAL_TABLET | Freq: Every day | ORAL | Status: DC
  Administered 2011-06-23 – 2011-06-27 (×5): 0.25 mg via ORAL
  Filled 2011-06-23 (×5): qty 1

## 2011-06-23 MED ORDER — MENTHOL 3 MG MT LOZG: 1.0000 | LOZENGE | OROMUCOSAL | Status: DC | PRN

## 2011-06-23 MED ORDER — PHENOL 1.4 % MT LIQD: 1.0000 | OROMUCOSAL | Status: DC | PRN

## 2011-06-23 MED ORDER — HYDROMORPHONE HCL PF 1 MG/ML IJ SOLN: 0.2500 mg | INTRAMUSCULAR | Status: DC | PRN

## 2011-06-23 MED ORDER — WARFARIN SODIUM 7.5 MG PO TABS
7.5000 mg | ORAL_TABLET | Freq: Once | ORAL | Status: AC
  Administered 2011-06-23: 7.5 mg via ORAL
  Filled 2011-06-23: qty 1

## 2011-06-23 MED ORDER — DIPHENHYDRAMINE HCL 12.5 MG/5ML PO ELIX: 12.5000 mg | ORAL_SOLUTION | Freq: Four times a day (QID) | ORAL | Status: DC | PRN

## 2011-06-23 SURGICAL SUPPLY — 55 items
BAG SPEC THK2 15X12 ZIP CLS (MISCELLANEOUS) ×1
BAG ZIPLOCK 12X15 (MISCELLANEOUS) ×2 IMPLANT
BANDAGE ELASTIC 6 VELCRO ST LF (GAUZE/BANDAGES/DRESSINGS) ×2 IMPLANT
BANDAGE ESMARK 6X9 LF (GAUZE/BANDAGES/DRESSINGS) ×1 IMPLANT
BLADE SAG 18X100X1.27 (BLADE) ×2 IMPLANT
BLADE SAW SGTL 11.0X1.19X90.0M (BLADE) ×2 IMPLANT
BNDG CMPR 9X6 STRL LF SNTH (GAUZE/BANDAGES/DRESSINGS) ×1
BNDG ESMARK 6X9 LF (GAUZE/BANDAGES/DRESSINGS) ×2
BOWL SMART MIX CTS (DISPOSABLE) ×2 IMPLANT
CATH KIT ON-Q SILVERSOAK 5 (CATHETERS) ×1 IMPLANT
CATH KIT ON-Q SILVERSOAK 5IN (CATHETERS) ×2 IMPLANT
CEMENT HV SMART SET (Cement) ×3 IMPLANT
CLOTH BEACON ORANGE TIMEOUT ST (SAFETY) ×2 IMPLANT
CUFF TOURN SGL QUICK 34 (TOURNIQUET CUFF) ×2
CUFF TRNQT CYL 34X4X40X1 (TOURNIQUET CUFF) ×1 IMPLANT
DRAPE EXTREMITY T 121X128X90 (DRAPE) ×2 IMPLANT
DRAPE POUCH INSTRU U-SHP 10X18 (DRAPES) ×2 IMPLANT
DRAPE U-SHAPE 47X51 STRL (DRAPES) ×2 IMPLANT
DRSG ADAPTIC 3X8 NADH LF (GAUZE/BANDAGES/DRESSINGS) ×2 IMPLANT
DRSG PAD ABDOMINAL 8X10 ST (GAUZE/BANDAGES/DRESSINGS) ×1 IMPLANT
DURAPREP 26ML APPLICATOR (WOUND CARE) ×2 IMPLANT
ELECT REM PT RETURN 9FT ADLT (ELECTROSURGICAL) ×2
ELECTRODE REM PT RTRN 9FT ADLT (ELECTROSURGICAL) ×1 IMPLANT
EVACUATOR 1/8 PVC DRAIN (DRAIN) ×2 IMPLANT
FACESHIELD LNG OPTICON STERILE (SAFETY) ×10 IMPLANT
GAUZE SPONGE 4X4 12PLY STRL LF (GAUZE/BANDAGES/DRESSINGS) ×1 IMPLANT
GLOVE BIO SURGEON STRL SZ7.5 (GLOVE) ×2 IMPLANT
GLOVE BIO SURGEON STRL SZ8 (GLOVE) ×2 IMPLANT
GLOVE BIOGEL PI IND STRL 8 (GLOVE) ×2 IMPLANT
GLOVE BIOGEL PI INDICATOR 8 (GLOVE) ×2
GOWN STRL NON-REIN LRG LVL3 (GOWN DISPOSABLE) ×3 IMPLANT
GOWN STRL REIN XL XLG (GOWN DISPOSABLE) ×2 IMPLANT
HANDPIECE INTERPULSE COAX TIP (DISPOSABLE) ×2
IMMOBILIZER KNEE 20 (SOFTGOODS) ×2
IMMOBILIZER KNEE 20 THIGH 36 (SOFTGOODS) ×1 IMPLANT
KIT BASIN OR (CUSTOM PROCEDURE TRAY) ×2 IMPLANT
MANIFOLD NEPTUNE II (INSTRUMENTS) ×2 IMPLANT
NS IRRIG 1000ML POUR BTL (IV SOLUTION) ×2 IMPLANT
PACK TOTAL JOINT (CUSTOM PROCEDURE TRAY) ×2 IMPLANT
PAD ABD 7.5X8 STRL (GAUZE/BANDAGES/DRESSINGS) ×2 IMPLANT
PADDING CAST COTTON 6X4 STRL (CAST SUPPLIES) ×6 IMPLANT
PADDING WEBRIL 6 STERILE (GAUZE/BANDAGES/DRESSINGS) ×1 IMPLANT
POSITIONER SURGICAL ARM (MISCELLANEOUS) ×2 IMPLANT
SET HNDPC FAN SPRY TIP SCT (DISPOSABLE) ×1 IMPLANT
SPONGE GAUZE 4X4 12PLY (GAUZE/BANDAGES/DRESSINGS) ×2 IMPLANT
STRIP CLOSURE SKIN 1/2X4 (GAUZE/BANDAGES/DRESSINGS) ×4 IMPLANT
SUCTION FRAZIER 12FR DISP (SUCTIONS) ×2 IMPLANT
SUT MNCRL AB 4-0 PS2 18 (SUTURE) ×2 IMPLANT
SUT PDS AB 1 CT1 27 (SUTURE) ×6 IMPLANT
SUT VIC AB 2-0 CT1 27 (SUTURE) ×6
SUT VIC AB 2-0 CT1 TAPERPNT 27 (SUTURE) ×3 IMPLANT
TOWEL OR 17X26 10 PK STRL BLUE (TOWEL DISPOSABLE) ×4 IMPLANT
TRAY FOLEY CATH 14FRSI W/METER (CATHETERS) ×2 IMPLANT
WATER STERILE IRR 1500ML POUR (IV SOLUTION) ×2 IMPLANT
WRAP KNEE MAXI GEL POST OP (GAUZE/BANDAGES/DRESSINGS) ×4 IMPLANT

## 2011-06-23 NOTE — Interval H&P Note (Signed)
History and Physical Interval Note:  06/23/2011 12:56 PM  Angel Greene  has presented today for surgery, with the diagnosis of osteoarthritis right knee  The various methods of treatment have been discussed with the patient and family. After consideration of risks, benefits and other options for treatment, the patient has consented to  Procedure(s): TOTAL KNEE ARTHROPLASTY as a surgical intervention .  The patients' history has been reviewed, patient examined, no change in status, stable for surgery.  I have reviewed the patients' chart and labs.  Questions were answered to the patient's satisfaction.     Loanne Drilling

## 2011-06-23 NOTE — Anesthesia Preprocedure Evaluation (Addendum)
Anesthesia Evaluation  Patient identified by MRN, date of birth, ID band Patient awake    Reviewed: Allergy & Precautions, H&P , NPO status , Patient's Chart, lab work & pertinent test results  Airway Mallampati: II TM Distance: >3 FB Neck ROM: Full    Dental No notable dental hx.  Bridge upper front 2.:   Pulmonary neg pulmonary ROS,  clear to auscultation  Pulmonary exam normal       Cardiovascular hypertension, Pt. on medications neg cardio ROS + dysrhythmias Atrial Fibrillation Irregular Normal    Neuro/Psych Early parkinson's. Clearance [name]  Neuromuscular disease Negative Neurological ROS  Negative Psych ROS   GI/Hepatic negative GI ROS, Neg liver ROS, hiatal hernia,   Endo/Other  Negative Endocrine ROS  Renal/GU negative Renal ROS  Genitourinary negative   Musculoskeletal negative musculoskeletal ROS (+)   Abdominal (+) obese,   Peds negative pediatric ROS (+)  Hematology negative hematology ROS (+)   Anesthesia Other Findings   Reproductive/Obstetrics negative OB ROS                          Anesthesia Physical Anesthesia Plan  ASA: III  Anesthesia Plan: General   Post-op Pain Management:    Induction: Intravenous  Airway Management Planned: Oral ETT  Additional Equipment:   Intra-op Plan:   Post-operative Plan: Extubation in OR  Informed Consent: I have reviewed the patients History and Physical, chart, labs and discussed the procedure including the risks, benefits and alternatives for the proposed anesthesia with the patient or authorized representative who has indicated his/her understanding and acceptance.   Dental advisory given  Plan Discussed with: CRNA  Anesthesia Plan Comments: (Discussed r/b spinal versus general. Patient prefers general)       Anesthesia Quick Evaluation

## 2011-06-23 NOTE — Anesthesia Postprocedure Evaluation (Signed)
  Anesthesia Post-op Note  Patient: Angel Greene  Procedure(s) Performed:  TOTAL KNEE ARTHROPLASTY  Patient Location: PACU  Anesthesia Type: General  Level of Consciousness: awake and alert   Airway and Oxygen Therapy: Patient Spontanous Breathing  Post-op Pain: mild  Post-op Assessment: Post-op Vital signs reviewed, Patient's Cardiovascular Status Stable, Respiratory Function Stable, Patent Airway and No signs of Nausea or vomiting  Post-op Vital Signs: stable  Complications: No apparent anesthesia complications

## 2011-06-23 NOTE — Transfer of Care (Signed)
Immediate Anesthesia Transfer of Care Note  Patient: Angel Greene  Procedure(s) Performed:  TOTAL KNEE ARTHROPLASTY  Patient Location: PACU  Anesthesia Type: General  Level of Consciousness: awake, oriented and sedated  Airway & Oxygen Therapy: Patient Spontanous Breathing and Patient connected to face mask oxygen  Post-op Assessment: Report given to PACU RN and Post -op Vital signs reviewed and stable  Post vital signs: Reviewed and stable  Complications: No apparent anesthesia complications

## 2011-06-23 NOTE — Progress Notes (Signed)
ANTICOAGULATION CONSULT NOTE - Initial Consult  Pharmacy Consult for warfarin Indication: s/p R TKA  No Known Allergies  Patient Measurements:   Body Weight: 90 kg  Vital Signs: Temp: 97.8 F (36.6 C) (12/19 1850) Temp src: Oral (12/19 0914) BP: 113/69 mmHg (12/19 1850) Pulse Rate: 99  (12/19 1850)  Labs:  [facility] 06/21/11 0950  HGB 15.0  HCT 45.3  PLT 250  APTT 27  LABPROT 12.8  INR 0.94  HEPARINUNFRC --  CREATININE --  CKTOTAL --  CKMB --  TROPONINI --   CrCl is unknown because there is no height on file for the current visit.  Medical History: Past Medical History  Diagnosis Date  . Dysrhythmia     atrial fibrilliation  . Hypertension     hyperlipidemia-  TEE,CARDIOLYTE STRESS 11/12 with LOV  DR TILLEY AND EKG ON CHART  . Peripheral vascular disease     Hx   DVT x 2  2000  . H/O hiatal hernia   . Neuromuscular disorder     Parkinsons, managed by Dr Anne Hahn-  LOV 11/12  . Bladder infection     06/10/11 with treatment by Dr Timothy Lasso  . Arthritis   . Cancer     2000- breast cancer followed by 6 months of chemo, radiation and  PO meds    Medications:  Scheduled:    . carbidopa-levodopa  0.5 tablet Oral BID  . ceFAZolin (ANCEF) IV  1 g Intravenous Q6H  . ceFAZolin (ANCEF) IV  2 g Intravenous Once  . clonazePAM  0.25 mg Oral QHS  . docusate sodium  100 mg Oral BID  . enoxaparin  30 mg Subcutaneous Q12H  . hydrochlorothiazide  25 mg Oral Daily  . morphine   Intravenous Q4H  . nortriptyline  30 mg Oral QHS  . QUEtiapine  12.5 mg Oral QHS  . Ropinirole HCl  1 tablet Oral QHS    Assessment: 77 yo female s/p right TKA to begin VTE prophylaxis with warfarin  Goal of Therapy:  INR 2-3   Plan:  1. 7.5mg  warfarin tonight 2. Send warfarin education booklet and have patient watch warfarin education video 3. Daily PT/INR   Hessie Knows, PharmD, BCPS 06/23/2011 7:28 PM [phone]

## 2011-06-23 NOTE — Op Note (Signed)
Pre-operative diagnosis- Osteoarthritis  Right knee(s)  Post-operative diagnosis- Osteoarthritis Right knee(s)  Procedure-  Right  Total Knee Arthroplasty  Surgeon- Gus Rankin. Jeanann Balinski, MD  Assistant- Avel Peace, PA-C   Anesthesia-  General EBL-* No blood loss amount entered *  Drains Hemovac  Tourniquet time- * Missing tourniquet times found for documented tourniquets in log:  5845 *   Complications- None  Condition-PACU - hemodynamically stable.   Brief Clinical Note  Angel Greene is a 77 y.o. year old female with end stage OA of her right knee with progressively worsening pain and dysfunction. She has constant pain, with activity and at rest and significant functional deficits with difficulties even with ADLs. She has had extensive non-op management including analgesics, injections of cortisone and viscosupplements, and home exercise program, but remains in significant pain with significant dysfunction.Radiographs show bone on bone arthritis all 3 compartments with marginal osteophytes. She presents now for left Total Knee Arthroplasty.    Procedure in detail---   The patient is brought into the operating room and positioned supine on the operating table. After successful administration of  General,   a tourniquet is placed high on the  Right thigh(s) and the lower extremity is prepped and draped in the usual sterile fashion. Time out is performed by the operating team and then the  Right lower extremity is wrapped in Esmarch, knee flexed and the tourniquet inflated to 300 mmHg.       A midline incision is made with a ten blade through the subcutaneous tissue to the level of the extensor mechanism. A fresh blade is used to make a medial parapatellar arthrotomy. Soft tissue over the proximal medial tibia is subperiosteally elevated to the joint line with a knife and into the semimembranosus bursa with a Cobb elevator. Soft tissue over the proximal lateral tibia is elevated with attention  being paid to avoiding the patellar tendon on the tibial tubercle. The patella is everted, knee flexed 90 degrees and the ACL and PCL are removed. Findings are bone on bone all 3 compartments with large marginal osteophytes.        The drill is used to create a starting hole in the distal femur and the canal is thoroughly irrigated with sterile saline to remove the fatty contents. The 5 degree Right  valgus alignment guide is placed into the femoral canal and the distal femoral cutting block is pinned to remove 11 mm off the distal femur. Resection is made with an oscillating saw.      The tibia is subluxed forward and the menisci are removed. The extramedullary alignment guide is placed referencing proximally at the medial aspect of the tibial tubercle and distally along the second metatarsal axis and tibial crest. The block is pinned to remove 2mm off the more deficient medial  side. Resection is made with an oscillating saw. Size 4is the most appropriate size for the tibia and the proximal tibia is prepared with the modular drill and keel punch for that size.      The femoral sizing guide is placed and size 4 narrow is most appropriate. Rotation is marked off the epicondylar axis and confirmed by creating a rectangular flexion gap at 90 degrees. The size 4 cutting block is pinned in this rotation and the anterior, posterior and chamfer cuts are made with the oscillating saw. The intercondylar block is then placed and that cut is made.      Trial size 4 tibial component, trial size 4 narrow  posterior stabilized femur and a 10  mm posterior stabilized rotating platform insert trial is placed. Full extension is achieved with excellent varus/valgus and anterior/posterior balance throughout full range of motion. The patella is everted and thickness measured to be 22  mm. Free hand resection is taken to 12 mm, a 35 template is placed, lug holes are drilled, trial patella is placed, and it tracks normally.  Osteophytes are removed off the posterior femur with the trial in place. All trials are removed and the cut bone surfaces prepared with pulsatile lavage. Cement is mixed and once ready for implantation, the size 4 tibial implant, size  4 narrow posterior stabilized femoral component, and the size 35 patella are cemented in place and the patella is held with the clamp. The trial insert is placed and the knee held in full extension. All extruded cement is removed and once the cement is hard the permanent 10 mm posterior stabilized rotating platform insert is placed into the tibial tray.      The wound is copiously irrigated with saline solution and the extensor mechanism closed over a hemovac drain with #1 PDS suture. The tourniquet is released for a total tourniquet time of 28  minutes. Flexion against gravity is 140 degrees and the patella tracks normally. Subcutaneous tissue is closed with 2.0 vicryl and subcuticular with running 4.0 Monocryl. The catheter for the Marcaine pain pump is placed and the pump is initiated. The incision is cleaned and dried and steri-strips and a bulky sterile dressing are applied. The limb is placed into a knee immobilizer and the patient is awakened and transported to recovery in stable condition.      Please note that a surgical assistant was a medical necessity for this procedure in order to perform it in a safe and expeditious manner. Surgical assistant was necessary to retract the ligaments and vital neurovascular structures to prevent injury to them and also necessary for proper positioning of the limb to allow for anatomic placement of the prosthesis.   Gus Rankin Susanna Benge, MD    06/23/2011, 2:07 PM

## 2011-06-24 LAB — CBC
Hemoglobin: 12.3 g/dL (ref 12.0–15.0)
MCH: 30.8 pg (ref 26.0–34.0)
MCHC: 32.9 g/dL (ref 30.0–36.0)
MCV: 93.5 fL (ref 78.0–100.0)
Platelets: 204 10*3/uL (ref 150–400)

## 2011-06-24 LAB — BASIC METABOLIC PANEL
CO2: 33 mEq/L — ABNORMAL HIGH (ref 19–32)
Calcium: 8.9 mg/dL (ref 8.4–10.5)
Creatinine, Ser: 0.99 mg/dL (ref 0.50–1.10)
GFR calc non Af Amer: 53 mL/min — ABNORMAL LOW (ref >90)
Glucose, Bld: 136 mg/dL — ABNORMAL HIGH (ref 70–99)
Sodium: 137 mEq/L (ref 135–145)

## 2011-06-24 LAB — PROTIME-INR: INR: 1.08 (ref 0.00–1.49)

## 2011-06-24 LAB — URINE CULTURE

## 2011-06-24 MED ORDER — WARFARIN VIDEO
Freq: Once | Status: AC
  Administered 2011-06-24: 11:00:00

## 2011-06-24 MED ORDER — MORPHINE SULFATE 2 MG/ML IJ SOLN: 1.0000 mg | INTRAMUSCULAR | Status: DC | PRN

## 2011-06-24 MED ORDER — PATIENT'S GUIDE TO USING COUMADIN BOOK
Freq: Once | Status: AC
  Administered 2011-06-24: 11:00:00
  Filled 2011-06-24: qty 1

## 2011-06-24 MED ORDER — SODIUM CHLORIDE 0.9 % IV BOLUS (SEPSIS)
500.0000 mL | Freq: Once | INTRAVENOUS | Status: AC
  Administered 2011-06-24: 500 mL via INTRAVENOUS

## 2011-06-24 MED ORDER — WARFARIN SODIUM 5 MG PO TABS
5.0000 mg | ORAL_TABLET | Freq: Once | ORAL | Status: AC
  Administered 2011-06-24: 5 mg via ORAL
  Filled 2011-06-24 (×2): qty 1

## 2011-06-24 NOTE — Progress Notes (Signed)
Pt's temp was 100.1 this AM. Pt given Incentive spirometer, education r/t IS and pt demonstrated ability to use IS. Will continue to monitor. Eugene Garnet [facility] 06/24/2011 6:59 AM

## 2011-06-24 NOTE — Progress Notes (Signed)
Vitals taken at 1930 to follow up tachy HR found at 1839.  HR at 1930 = 130. Patients appears to be asymptomatic, reports no pain when asked. PA on call, Norval Gable, notified. Advised to continue monitoring and call with any change in condition.

## 2011-06-24 NOTE — Progress Notes (Signed)
Utilization review completed.  

## 2011-06-24 NOTE — Progress Notes (Signed)
Physical Therapy Treatment Patient Details Name: NALAYA WOJDYLA MRN: [id number] DOB: 18-Mar-1930 Today's Date: 06/24/2011 Time: 7829-5621 Charge: TE PT Assessment/Plan  PT - Assessment/Plan Comments on Treatment Session: Pt did well with transferring back to bed and performing exercises. PT Plan: Discharge plan remains appropriate;Frequency remains appropriate Follow Up Recommendations: Skilled nursing facility Equipment Recommended: Defer to next venue PT Goals  Acute Rehab PT Goals PT Goal: Supine/Side to Sit - Progress: Progressing toward goal PT Goal: Sit to Stand - Progress: Progressing toward goal PT Goal: Perform Home Exercise Program - Progress: Progressing toward goal  PT Treatment Precautions/Restrictions  Precautions Precautions: Knee Required Braces or Orthoses: Yes Knee Immobilizer: Discontinue once straight leg raise with < 10 degree lag Restrictions Weight Bearing Restrictions: Yes RLE Weight Bearing: Weight bearing as tolerated Mobility (including Balance) Bed Mobility Bed Mobility: Yes Sit to Supine - Right: 3: Mod assist;With rail;HOB flat Sit to Supine - Right Details (indicate cue type and reason): verbal cues for technique, assist for bilateral LEs Transfers Transfers: Yes Sit to Stand: 3: Mod assist;With armrests;From chair/3-in-1 Sit to Stand Details (indicate cue type and reason): +2 for safety, verbal cues for L LE back and hand placement Stand to Sit: 4: Min assist;To elevated surface;To bed;With upper extremity assist Stand to Sit Details: verbal cues for hand placement and R LE forward Stand Pivot Transfers: 4: Min assist Stand Pivot Transfer Details (indicate cue type and reason): verbal cues for instructions and positioning RW    Exercise  Total Joint Exercises Ankle Circles/Pumps: AROM;Both;20 reps;Supine Quad Sets: Supine;20 reps;Strengthening;Both Short Arc Quad: Strengthening;AAROM;Supine;10 reps;Right Heel Slides: 10  reps;AAROM;Strengthening;Supine;Right Hip ABduction/ADduction: AAROM;Strengthening;Right;10 reps;Supine Knee Flexion: Other (comment) (R knee AAROM 0-40* supine) End of Session PT - End of Session Equipment Utilized During Treatment: Right knee immobilizer Activity Tolerance: Patient tolerated treatment well Patient left: in chair;with call bell in reach  [facility] 06/24/2011, 4:14 PM Pager: [phone]

## 2011-06-24 NOTE — Progress Notes (Signed)
Patient Morphine PCA d/c'd per MD order,bolus started

## 2011-06-24 NOTE — Progress Notes (Signed)
ANTICOAGULATION CONSULT NOTE - Follow Up Consult  Pharmacy Consult for Coumadin Indication: VTE ppx s/p R TKA  No Known Allergies  Patient Measurements: Height: 5\' 4"  (162.6 cm) Weight: 198 lb (89.812 kg) IBW/kg (Calculated) : 54.7    Vital Signs: Temp: 100.1 F (37.8 C) (12/20 0618) Temp src: Oral (12/20 0618) BP: 120/68 mmHg (12/20 0618) Pulse Rate: 97  (12/20 0618)  Labs:  Basename 06/24/11 0345 06/21/11 0950  HGB 12.3 15.0  HCT 37.4 45.3  PLT 204 250  APTT -- 27  LABPROT 14.2 12.8  INR 1.08 0.94  HEPARINUNFRC -- --  CREATININE 0.99 --  CKTOTAL -- --  CKMB -- --  TROPONINI -- --   Estimated Creatinine Clearance: 50 ml/min (by C-G formula based on Cr of 0.99).   Medications:  Scheduled:    . carbidopa-levodopa  0.5 tablet Oral BID  . ceFAZolin (ANCEF) IV  1 g Intravenous Q6H  . ceFAZolin (ANCEF) IV  2 g Intravenous Once  . clonazePAM  0.25 mg Oral QHS  . coumadin book   Does not apply Once  . docusate sodium  100 mg Oral BID  . enoxaparin  30 mg Subcutaneous Q12H  . hydrochlorothiazide  25 mg Oral Daily  . morphine   Intravenous Q4H  . nortriptyline  30 mg Oral QHS  . QUEtiapine  12.5 mg Oral QHS  . Ropinirole HCl  1 tablet Oral QHS  . warfarin  7.5 mg Oral Once  . warfarin   Does not apply Once   PRN: acetaminophen, acetaminophen, diphenhydrAMINE, diphenhydrAMINE, diphenhydrAMINE, menthol-cetylpyridinium, methocarbamol(ROBAXIN) IV, methocarbamol, metoCLOPramide (REGLAN) injection, metoCLOPramide, naloxone, ondansetron (ZOFRAN) IV, ondansetron (ZOFRAN) IV, ondansetron, oxyCODONE, phenol, sodium chloride, temazepam, traMADol, DISCONTD: bupivacaine 0.25 % ON-Q pump SINGLE CATH 300 mL, DISCONTD: HYDROmorphone  Assessment: 77 yo F s/p R TKA. Coumadin started 12/19 for VTE ppx. Also on Lovenox 30mg  sq q12h until INR >=1.8. Coumadin 7.5mg  given last night. No bleeding reported. INR only up slightly as expected since only just beginning anticoagulation with  Coumadin.   Goal of Therapy:  INR 2-3   Plan:  Coumadin 5mg  today based on nomogram. INR daily. Education prior to d/c. Continue LMWH until INR >=1.8  Gwen Her PharmD  [phone] 06/24/2011 9:45 AM

## 2011-06-24 NOTE — Progress Notes (Signed)
FL2 in shadow chart for MD signature. Pt plans to go to [facility] ( [facility] ) for [facility] SNF placement. D/C Summary should be completed Fri if pt will be ready for D/C Sat. [facility] will admit on Sat. CSW will assist with D/C planning to SNF. Brief psychosocial assessment located in shadow chart.

## 2011-06-24 NOTE — Progress Notes (Signed)
Physical Therapy Evaluation Patient Details Name: Angel Greene MRN: [id number] DOB: 01-11-1930 Today's Date: 06/24/2011 Time: [phone] Charge: [name]  Problem List:  Patient Active Problem List  Diagnoses  . OA (osteoarthritis) of knee    Past Medical History:  Past Medical History  Diagnosis Date  . Dysrhythmia     atrial fibrilliation  . Hypertension     hyperlipidemia-  TEE,CARDIOLYTE STRESS 11/12 with LOV  DR TILLEY AND EKG ON CHART  . Peripheral vascular disease     Hx   DVT x 2  2000  . H/O hiatal hernia   . Neuromuscular disorder     Parkinsons, managed by Dr Anne Hahn-  LOV 11/12  . Bladder infection     06/10/11 with treatment by Dr Timothy Lasso  . Arthritis   . Cancer     2000- breast cancer followed by 6 months of chemo, radiation and  PO meds   Past Surgical History:  Past Surgical History  Procedure Date  . Breast surgery     LUMPECTOMY  WITH AXILLIARY DISSECTION   2000  . Back surgery     lumbar x 3  ? type  . Tonsillectomy   . Cardiac catheterization     bilateral cataract extraction with IOL  . Cholecystectomy   . Joint replacement     RIGHT HIP REPLACEMENT   1996    PT Assessment/Plan/Recommendation PT Assessment Clinical Impression Statement: Pt would benefit from acute PT services to improve independence with transfers and ambulation by increased R LE strength and ROM to prepare for d/c to SNF. PT Recommendation/Assessment: Patient will need skilled PT in the acute care venue PT Problem List: Decreased strength;Decreased range of motion;Decreased mobility;Decreased knowledge of use of DME;Decreased knowledge of precautions;Pain PT Therapy Diagnosis : Difficulty walking;Acute pain PT Plan PT Frequency: 7X/week PT Recommendation Follow Up Recommendations: Skilled nursing facility Equipment Recommended: Defer to next venue PT Goals  Acute Rehab PT Goals PT Goal Formulation: With patient Time For Goal Achievement: 7 days Pt will go Supine/Side to  Sit: with supervision PT Goal: Supine/Side to Sit - Progress: Not met Pt will go Sit to Stand: with supervision PT Goal: Sit to Stand - Progress: Not met Pt will Ambulate: with supervision;with rolling walker;>150 feet PT Goal: Ambulate - Progress: Not met Pt will Perform Home Exercise Program: with supervision, verbal cues required/provided PT Goal: Perform Home Exercise Program - Progress: Not met  PT Evaluation Precautions/Restrictions  Precautions Precautions: Knee Required Braces or Orthoses: Yes Knee Immobilizer: Discontinue once straight leg raise with < 10 degree lag Restrictions Weight Bearing Restrictions: Yes RLE Weight Bearing: Weight bearing as tolerated Prior Functioning  Home Living Lives With: Spouse Type of Home: House Home Layout: Two level;Other (Comment) (Lives on first level only) Home Access: Stairs to enter Entrance Stairs-Rails: Can reach both Entrance Stairs-Number of Steps: 2 STE Bathroom Shower/Tub: Walk-in shower;Other (comment) (w/ small lip, has shower chair) Bathroom Toilet: Handicapped height Bathroom Accessibility: Yes How Accessible: Accessible via walker Home Adaptive Equipment: Bedside commode/3-in-1;Walker - rolling;Walker - four wheeled;Grab bars in shower;Grab bars around toilet;Other (comment);Shower chair without back (Designer, industrial/product) Additional Comments: Pt plans to d/c to SNF Prior Function Level of Independence: Requires assistive device for independence;Independent with basic ADLs Cognition Cognition Arousal/Alertness: Awake/alert Overall Cognitive Status: Appears within functional limits for tasks assessed Orientation Level: Oriented X4 Sensation/Coordination Sensation Light Touch: Appears Intact Coordination Gross Motor Movements are Fluid and Coordinated: Yes Fine Motor Movements are Fluid and Coordinated: Yes Extremity  Assessment RUE Assessment RUE Assessment: Within Functional Limits LUE Assessment LUE Assessment:  Within Functional Limits RLE Assessment RLE Assessment: Not tested (kept in KI, TBA) LLE Assessment LLE Assessment: Within Functional Limits Mobility (including Balance) Bed Mobility Bed Mobility: Yes Supine to Sit: 4: Min assist;HOB elevated (Comment degrees);With rails Supine to Sit Details (indicate cue type and reason): HOB 30*, assist required for R LE, increased verbal cues to perform technique, increased time Sitting - Scoot to Edge of Bed: 3: Mod assist Sitting - Scoot to Edge of Bed Details (indicate cue type and reason): verbal and tactile cues given to scoot hips forward to edge of bed, support for R LE given as well Transfers Transfers: Yes Sit to Stand: 3: Mod assist;With upper extremity assist Sit to Stand Details (indicate cue type and reason): verbal cues for hand placement and forward lean Stand to Sit: 4: Min assist;With upper extremity assist;With armrests;To chair/3-in-1 Stand to Sit Details: verbal cues for hand placement, R LE forward, and bringing RW with pt backing up to recliner Ambulation/Gait Ambulation/Gait: Yes Ambulation/Gait Assistance: 4: Min assist Ambulation/Gait Assistance Details (indicate cue type and reason): minA upon initiating and min/guard during gait, verbal cues for sequence, pt keeps R LE advanced forward throughout gait and leans toward left side despite  tactile and verbal cues to correct. Ambulation Distance (Feet): 38 Feet Assistive device: Rolling walker Gait Pattern: Step-to pattern;Decreased stance time - right;Decreased weight shift to right;Right hip hike Gait velocity: very slow cadence    Exercise    End of Session PT - End of Session Equipment Utilized During Treatment: Right knee immobilizer Activity Tolerance: Patient limited by pain Patient left: in chair;with call bell in reach (with RN) Nurse Communication: Mobility status for transfers;Weight bearing status General Behavior During Session: [facility] for tasks  performed Cognition: [facility] for tasks performed  Tabb Croghan,KATHrine E 06/24/2011, 11:02 AM Pager: [phone]

## 2011-06-24 NOTE — Progress Notes (Signed)
Subjective: 1 Day Post-Op Procedure(s) (LRB): TOTAL KNEE ARTHROPLASTY (Right) Patient reports pain as mild and moderate.   Patient seen in rounds with Dr. Lequita Halt. Patient has complaints of having pain thru the night.  Also some slight temperature.  Encourage I.S. At bedside and will get up today.  Will try and move to the ortho floor. We will start therapy today. Plan is to go SNF after hospital stay.  Objective: Vital signs in last 24 hours: Temp:  [94.4 F (34.7 C)-100.1 F (37.8 C)] 100.1 F (37.8 C) (12/20 0618) Pulse Rate:  [90-103] 97  (12/20 0618) Resp:  [10-18] 18  (12/20 0807) BP: (95-126)/(44-83) 120/68 mmHg (12/20 0618) SpO2:  [92 %-99 %] 96 % (12/20 0807) Weight:  [89.812 kg (198 lb)] 198 lb (89.812 kg) (12/20 0036)  Intake/Output from previous day:  Intake/Output Summary (Last 24 hours) at 06/24/11 0941 Last data filed at 06/24/11 0600  Gross per 24 hour  Intake   1850 ml  Output    980 ml  Net    870 ml    Intake/Output this shift: Urine output is low and will give fluid bolus today and monitor UOP.  Labs:  [facility] 06/24/11 0345 06/21/11 0950  HGB 12.3 15.0    Basename 06/24/11 0345 06/21/11 0950  WBC 9.2 5.5  RBC 4.00 4.90  HCT 37.4 45.3  PLT 204 250    Basename 06/24/11 0345  NA 137  K 3.7  CL 100  CO2 33*  BUN 14  CREATININE 0.99  GLUCOSE 136*  CALCIUM 8.9    Basename 06/24/11 0345 06/21/11 0950  LABPT -- --  INR 1.08 0.94    Exam - Neurovascular intact Sensation intact distally Dressing - clean, dry Motor function intact - moving foot and toes well on exam.  Hemovac pulled without difficulty.  Assessment/Plan: 1 Day Post-Op Procedure(s) (LRB): TOTAL KNEE ARTHROPLASTY (Right)  Past Medical History  Diagnosis Date  . Dysrhythmia     atrial fibrilliation  . Hypertension     hyperlipidemia-  TEE,CARDIOLYTE STRESS 11/12 with LOV  DR TILLEY AND EKG ON CHART  . Peripheral vascular disease     Hx   DVT x 2  2000  . H/O hiatal  hernia   . Neuromuscular disorder     Parkinsons, managed by Dr Anne Hahn-  LOV 11/12  . Bladder infection     06/10/11 with treatment by Dr Timothy Lasso  . Arthritis   . Cancer     2000- breast cancer followed by 6 months of chemo, radiation and  PO meds    Advance diet Up with therapy Discharge to SNF when bed available.  Will look for bed.  DVT Prophylaxis - Lovenox Bridge and Coumadin Protocol Weight-Bearing as tolerated to right leg Keep foley until tomorrow. No vaccines. D/C PCA, Change to IV push D/C O2 and Pulse OX and try on Room [address]  Patrica Duel 06/24/2011, 9:41 AM

## 2011-06-24 NOTE — Progress Notes (Signed)
06/24/11, Kathi Der RNC-MNN, BSN. [phone], CM received referral.  OT recommendation is for SNF.  CSW referral made.  Will follow.

## 2011-06-24 NOTE — Progress Notes (Signed)
Report given to Lindsay 64flr nurse, will transfer patient to [phone].

## 2011-06-24 NOTE — Progress Notes (Signed)
Occupational Therapy Evaluation Patient Details Name: Angel Greene MRN: [id number] DOB: 1930/02/15 Today's Date: 06/24/2011 Time: 9:35-10:04am Ev II; 2 Indirect  Problem List:  Patient Active Problem List  Diagnoses  . OA (osteoarthritis) of knee    Past Medical History:  Past Medical History  Diagnosis Date  . Dysrhythmia     atrial fibrilliation  . Hypertension     hyperlipidemia-  TEE,CARDIOLYTE STRESS 11/12 with LOV  DR TILLEY AND EKG ON CHART  . Peripheral vascular disease     Hx   DVT x 2  2000  . H/O hiatal hernia   . Neuromuscular disorder     Parkinsons, managed by Dr Anne Hahn-  LOV 11/12  . Bladder infection     06/10/11 with treatment by Dr Timothy Lasso  . Arthritis   . Cancer     2000- breast cancer followed by 6 months of chemo, radiation and  PO meds   Past Surgical History:  Past Surgical History  Procedure Date  . Breast surgery     LUMPECTOMY  WITH AXILLIARY DISSECTION   2000  . Back surgery     lumbar x 3  ? type  . Tonsillectomy   . Cardiac catheterization     bilateral cataract extraction with IOL  . Cholecystectomy   . Joint replacement     RIGHT HIP REPLACEMENT   1996    OT Assessment/Plan/Recommendation OT Assessment Clinical Impression Statement: Pt is currently Max assist LB ADL's w/ Min Assist funct mobility and Mod assist +1 sit-stand activities from EOB. Pt should benefit from acute OT for ADL retraining & plans to d/c to SNF for short term Rehab. OT Recommendation/Assessment: Patient will need skilled OT in the acute care venue OT Problem List: Decreased activity tolerance;Decreased knowledge of use of DME or AE;Pain OT Therapy Diagnosis : Generalized weakness;Acute pain OT Plan OT Frequency: Min 2X/week OT Treatment/Interventions: Self-care/ADL training;DME and/or AE instruction;Therapeutic activities OT Recommendation Follow Up Recommendations: Skilled nursing facility Equipment Recommended: Defer to next venue Individuals  Consulted Consulted and Agree with Results and Recommendations: Patient OT Goals Acute Rehab OT Goals OT Goal Formulation: With patient ADL Goals Pt Will Perform Grooming: with supervision;Standing at sink ADL Goal: Grooming - Progress: Progressing toward goals Pt Will Perform Upper Body Dressing: with modified independence;Sitting, chair;Sitting, bed;Unsupported ADL Goal: Upper Body Dressing - Progress: Progressing toward goals Pt Will Perform Lower Body Dressing: with min assist;Sitting, chair;Sitting, bed;with adaptive equipment;with cueing (comment type and amount);Sit to stand from bed;Sit to stand from chair ADL Goal: Lower Body Dressing - Progress: Progressing toward goals Additional ADL Goal #1: Pt will perform all aspects of toileting w/ Supervision using RW, 3:1 over toilet ADL Goal: Additional Goal #1 - Progress: Progressing toward goals  OT Evaluation Precautions/Restrictions  Precautions Precautions: Knee Required Braces or Orthoses: Yes Knee Immobilizer: Discontinue once straight leg raise with < 10 degree lag Restrictions Weight Bearing Restrictions: Yes RLE Weight Bearing: Weight bearing as tolerated Prior Functioning Home Living Lives With: Spouse Type of Home: House Home Layout: Two level;Other (Comment) (Lives on first level only) Home Access: Stairs to enter Entrance Stairs-Rails: Can reach both Entrance Stairs-Number of Steps: 2 STE Bathroom Shower/Tub: Walk-in shower;Other (comment) (w/ small lip, has shower chair) Bathroom Toilet: Handicapped height Bathroom Accessibility: Yes How Accessible: Accessible via walker Home Adaptive Equipment: Bedside commode/3-in-1;Walker - rolling;Walker - four wheeled;Grab bars in shower;Grab bars around toilet;Other (comment);Shower chair without back (Designer, industrial/product) Additional Comments: Pt plans to d/c to SNF Prior Function  Level of Independence: Requires assistive device for independence;Independent with basic  ADLs ADL ADL Eating/Feeding: Performed;Modified independent Where Assessed - Eating/Feeding: Bed level Grooming: Simulated;Modified independent Where Assessed - Grooming: Sitting, chair Upper Body Bathing: Simulated;Set up Where Assessed - Upper Body Bathing: Sitting, chair;Supported Lower Body Bathing: Simulated;Maximal assistance Where Assessed - Lower Body Bathing: Sitting, chair;Sitting at sink Upper Body Dressing: Performed;Supervision/safety Upper Body Dressing Details (indicate cue type and reason): secondary to IV/Lines Where Assessed - Upper Body Dressing: Sitting, chair Lower Body Dressing: Simulated;Moderate assistance Lower Body Dressing Details (indicate cue type and reason): w/ A/E Where Assessed - Lower Body Dressing: Sitting, chair Toilet Transfer: Simulated;Minimal assistance;Moderate assistance Toilet Transfer Method: Proofreader: Set designer - Clothing Manipulation: Not assessed Toileting - Hygiene: Not assessed Equipment Used: Rolling walker;Other (comment) (3:1) ADL Comments: Pt is currently Max assist LB ADL's w/ Min Assist funct mobility and Mod assist +1 sit-stand activities from EOB. Pt should benefit from acute OT for ADL retraining & plans to d/c to SNF for short term Rehab. Vision/Perception  Vision - History Baseline Vision: Wears glasses only for reading Patient Visual Report: Other (comment) (Pt reports bilat eyes watering since surgery, RN aware) Vision - Assessment Eye Alignment: Within Functional Limits Cognition Cognition Arousal/Alertness: Awake/alert Overall Cognitive Status: Appears within functional limits for tasks assessed Orientation Level: Oriented X4 Sensation/Coordination Sensation Light Touch: Appears Intact Coordination Gross Motor Movements are Fluid and Coordinated: Yes Fine Motor Movements are Fluid and Coordinated: Yes Extremity Assessment RUE Assessment RUE Assessment: Within  Functional Limits LUE Assessment LUE Assessment: Within Functional Limits Mobility  Bed Mobility Bed Mobility: Yes Supine to Sit: 4: Min assist;With rails;HOB elevated (Comment degrees) Supine to Sit Details (indicate cue type and reason): HOB approx 30 degrees Sitting - Scoot to Edge of Bed: 3: Mod assist;With rail (VC's & TC's) Transfers Transfers: Yes Sit to Stand: 3: Mod assist;From bed;With armrests;With upper extremity assist Stand to Sit: 4: Min assist;Without upper extremity assist;With armrests;To chair/3-in-1 Stand to Sit Details: VC's for hand placement   End of Session OT - End of Session Equipment Utilized During Treatment: Right knee immobilizer;Other (comment) (RW, 3:1) Activity Tolerance: Patient tolerated treatment well Patient left: in chair;with call bell in reach Nurse Communication: Mobility status for transfers;Weight bearing status General Behavior During Session: [facility] for tasks performed Cognition: [facility] for tasks performed   Alm Bustard 06/24/2011, 10:30 AM

## 2011-06-25 ENCOUNTER — Inpatient Hospital Stay (HOSPITAL_COMMUNITY): Payer: Medicare Other

## 2011-06-25 ENCOUNTER — Other Ambulatory Visit: Payer: Self-pay

## 2011-06-25 ENCOUNTER — Encounter (HOSPITAL_COMMUNITY): Payer: Self-pay | Admitting: Cardiology

## 2011-06-25 DIAGNOSIS — G20 Parkinson's disease: Secondary | ICD-10-CM | POA: Diagnosis present

## 2011-06-25 DIAGNOSIS — R000 Tachycardia, unspecified: Secondary | ICD-10-CM

## 2011-06-25 DIAGNOSIS — R404 Transient alteration of awareness: Secondary | ICD-10-CM

## 2011-06-25 DIAGNOSIS — R410 Disorientation, unspecified: Secondary | ICD-10-CM | POA: Diagnosis present

## 2011-06-25 DIAGNOSIS — E669 Obesity, unspecified: Secondary | ICD-10-CM | POA: Insufficient documentation

## 2011-06-25 DIAGNOSIS — I119 Hypertensive heart disease without heart failure: Secondary | ICD-10-CM

## 2011-06-25 DIAGNOSIS — I4891 Unspecified atrial fibrillation: Secondary | ICD-10-CM | POA: Diagnosis present

## 2011-06-25 LAB — CBC
MCH: 30.1 pg (ref 26.0–34.0)
MCHC: 32.2 g/dL (ref 30.0–36.0)
Platelets: 150 10*3/uL (ref 150–400)
RDW: 14.7 % (ref 11.5–15.5)

## 2011-06-25 LAB — DIFFERENTIAL
Basophils Absolute: 0 10*3/uL (ref 0.0–0.1)
Basophils Relative: 0 % (ref 0–1)
Eosinophils Absolute: 0.5 10*3/uL (ref 0.0–0.7)
Neutrophils Relative %: 69 % (ref 43–77)

## 2011-06-25 LAB — PROTIME-INR
INR: 1.58 — ABNORMAL HIGH (ref 0.00–1.49)
Prothrombin Time: 19.2 seconds — ABNORMAL HIGH (ref 11.6–15.2)

## 2011-06-25 LAB — TSH: TSH: 1.381 u[IU]/mL (ref 0.350–4.500)

## 2011-06-25 LAB — CARDIAC PANEL(CRET KIN+CKTOT+MB+TROPI)
CK, MB: 4.6 ng/mL — ABNORMAL HIGH (ref 0.3–4.0)
Troponin I: <0.3 ng/mL (ref <0.30)

## 2011-06-25 LAB — BASIC METABOLIC PANEL
BUN: 11 mg/dL (ref 6–23)
GFR calc Af Amer: 74 mL/min — ABNORMAL LOW (ref >90)
GFR calc non Af Amer: 63 mL/min — ABNORMAL LOW (ref >90)
Potassium: 3.5 mEq/L (ref 3.5–5.1)
Sodium: 135 mEq/L (ref 135–145)

## 2011-06-25 LAB — DIGOXIN LEVEL: Digoxin Level: 0.8 ng/mL (ref 0.8–2.0)

## 2011-06-25 MED ORDER — IOHEXOL 300 MG/ML  SOLN
100.0000 mL | Freq: Once | INTRAMUSCULAR | Status: AC | PRN
  Administered 2011-06-25: 100 mL via INTRAVENOUS

## 2011-06-25 MED ORDER — ONDANSETRON HCL 4 MG PO TABS: 4.0000 mg | ORAL_TABLET | Freq: Four times a day (QID) | ORAL | Status: AC | PRN

## 2011-06-25 MED ORDER — DSS 100 MG PO CAPS: 100.0000 mg | ORAL_CAPSULE | Freq: Two times a day (BID) | ORAL | Status: AC

## 2011-06-25 MED ORDER — DIGOXIN 0.1 MG/ML IJ SOLN
0.5000 mg | Freq: Once | INTRAMUSCULAR | Status: DC
  Filled 2011-06-25: qty 5

## 2011-06-25 MED ORDER — METOPROLOL TARTRATE 1 MG/ML IV SOLN: 5.0000 mg | Freq: Once | INTRAVENOUS | Status: DC

## 2011-06-25 MED ORDER — WARFARIN SODIUM 5 MG PO TABS
5.0000 mg | ORAL_TABLET | Freq: Once | ORAL | Status: AC
  Administered 2011-06-25: 5 mg via ORAL
  Filled 2011-06-25: qty 1

## 2011-06-25 MED ORDER — NORTRIPTYLINE HCL 10 MG PO CAPS
10.0000 mg | ORAL_CAPSULE | Freq: Three times a day (TID) | ORAL | Status: DC
  Administered 2011-06-25 – 2011-06-28 (×10): 10 mg via ORAL
  Filled 2011-06-25 (×13): qty 1

## 2011-06-25 MED ORDER — ENOXAPARIN SODIUM 30 MG/0.3ML ~~LOC~~ SOLN: 30.0000 mg | Freq: Two times a day (BID) | SUBCUTANEOUS | Status: DC

## 2011-06-25 MED ORDER — HYDROCODONE-ACETAMINOPHEN 5-325 MG PO TABS
1.0000 | ORAL_TABLET | Freq: Four times a day (QID) | ORAL | Status: DC | PRN
  Administered 2011-06-25: 1 via ORAL
  Filled 2011-06-25: qty 1

## 2011-06-25 MED ORDER — SODIUM CHLORIDE 0.9 % IV SOLN
INTRAVENOUS | Status: AC
  Administered 2011-06-25: 19:00:00 via INTRAVENOUS

## 2011-06-25 MED ORDER — HYDROCODONE-ACETAMINOPHEN 5-325 MG PO TABS: 1.0000 | ORAL_TABLET | Freq: Four times a day (QID) | ORAL | Status: DC | PRN

## 2011-06-25 MED ORDER — METHOCARBAMOL 500 MG PO TABS: 500.0000 mg | ORAL_TABLET | Freq: Four times a day (QID) | ORAL | Status: AC | PRN

## 2011-06-25 MED ORDER — DIGOXIN 0.25 MG/ML IJ SOLN
0.5000 mg | Freq: Once | INTRAMUSCULAR | Status: AC
  Administered 2011-06-25: 0.5 mg via INTRAVENOUS
  Filled 2011-06-25: qty 2

## 2011-06-25 MED ORDER — SODIUM CHLORIDE 0.9 % IV SOLN
INTRAVENOUS | Status: DC
  Administered 2011-06-25: 13:00:00 via INTRAVENOUS

## 2011-06-25 MED ORDER — HALOPERIDOL LACTATE 5 MG/ML IJ SOLN: 0.5000 mg | Freq: Four times a day (QID) | INTRAMUSCULAR | Status: DC | PRN

## 2011-06-25 MED ORDER — SODIUM CHLORIDE 0.9 % IV BOLUS (SEPSIS)
500.0000 mL | Freq: Once | INTRAVENOUS | Status: AC
  Administered 2011-06-25: 500 mL via INTRAVENOUS

## 2011-06-25 MED ORDER — POTASSIUM CHLORIDE IN NACL 20-0.45 MEQ/L-% IV SOLN
INTRAVENOUS | Status: DC
  Administered 2011-06-25: 06:00:00 via INTRAVENOUS
  Filled 2011-06-25 (×3): qty 1000

## 2011-06-25 NOTE — Discharge Summary (Signed)
Physician Discharge Summary   Patient ID: Angel Greene MRN: [id number] DOB/AGE: [birth year] 77 y.o.  Admit date: 06/23/2011 Discharge date: 06/26/2011 (tenative date of discharge)  Primary Diagnosis: Osteoarthritis right knee  Admission Diagnoses: Past Medical History  Diagnosis Date  . Dysrhythmia     atrial fibrilliation  . Hypertension     hyperlipidemia-  TEE,CARDIOLYTE STRESS 11/12 with LOV  DR TILLEY AND EKG ON CHART  . Peripheral vascular disease     Hx   DVT x 2  2000  . H/O hiatal hernia   . Neuromuscular disorder     Parkinsons, managed by Dr Anne Hahn-  LOV 11/12  . Bladder infection     06/10/11 with treatment by Dr Timothy Lasso  . Arthritis   . Cancer     2000- breast cancer followed by 6 months of chemo, radiation and  PO meds    Discharge Diagnoses:  Principal Problem:  *OA (osteoarthritis) of knee Postop Dysrhythmia, probably A.Fib with RVR Postop Altered Mental Status Postop Hypotension, multifactorial  Procedure: Procedure(s) (LRB): TOTAL KNEE ARTHROPLASTY (Right)   Consults: Medicine/Hospitalists  HPI:  Angel Greene is a 77 y.o. year old female with end stage OA of her right knee with progressively worsening pain and dysfunction. She has constant pain, with activity and at rest and significant functional deficits with difficulties even with ADLs. She has had extensive non-op management including analgesics, injections of cortisone and viscosupplements, and home exercise program, but remains in significant pain with significant dysfunction.Radiographs show bone on bone arthritis all 3 compartments with marginal osteophytes. She presents now for left Total Knee Arthroplasty.      Laboratory Data: Hospital Outpatient Visit on 06/21/2011  Component Date Value Range Status  . aPTT (seconds) 06/21/2011 27  24-37 Final  . Neutrophils Relative (%) 06/21/2011 59  43-77 Final  . Neutro Abs (K/uL) 06/21/2011 3.2  1.7-7.7 Final  . Lymphocytes Relative (%) 06/21/2011  31  12-46 Final  . Lymphs Abs (K/uL) 06/21/2011 1.7  0.7-4.0 Final  . Monocytes Relative (%) 06/21/2011 7  3-12 Final  . Monocytes Absolute (K/uL) 06/21/2011 0.4  0.1-1.0 Final  . Eosinophils Relative (%) 06/21/2011 3  0-5 Final  . Eosinophils Absolute (K/uL) 06/21/2011 0.2  0.0-0.7 Final  . Basophils Relative (%) 06/21/2011 1  0-1 Final  . Basophils Absolute (K/uL) 06/21/2011 0.0  0.0-0.1 Final  . Prothrombin Time (seconds) 06/21/2011 12.8  11.6-15.2 Final  . INR  06/21/2011 0.94  0.00-1.49 Final  . Color, Urine  06/21/2011 YELLOW  YELLOW Final  . APPearance  06/21/2011 CLOUDY* CLEAR Final  . Specific Gravity, Urine  06/21/2011 1.016  1.005-1.030 Final  . pH  06/21/2011 6.5  5.0-8.0 Final  . Glucose, UA (mg/dL) 40/98/1191 NEGATIVE  NEGATIVE Final  . Hgb urine dipstick  06/21/2011 NEGATIVE  NEGATIVE Final  . Bilirubin Urine  06/21/2011 NEGATIVE  NEGATIVE Final  . Ketones, ur (mg/dL) 47/82/9562 NEGATIVE  NEGATIVE Final  . Protein, ur (mg/dL) 13/02/6577 NEGATIVE  NEGATIVE Final  . Urobilinogen, UA (mg/dL) 46/96/2952 0.2  8.4-1.3 Final  . Nitrite  06/21/2011 NEGATIVE  NEGATIVE Final  . Leukocytes, UA  06/21/2011 LARGE* NEGATIVE Final  . MRSA, PCR  06/21/2011 NEGATIVE  NEGATIVE Final  . Staphylococcus aureus  06/21/2011 NEGATIVE  NEGATIVE Final   Comment:                                 The Xpert SA Assay (  FDA                          approved for NASAL specimens                          only), is one component of                          a comprehensive surveillance                          program.  It is not intended                          to diagnose infection nor to                          guide or monitor treatment.  . ABO/RH(D)  06/21/2011 AB POS   Final  . Antibody Screen  06/21/2011 NEG   Final  . Sample Expiration  06/21/2011 06/24/2011   Final  . WBC (K/uL) 06/21/2011 5.5  4.0-10.5 Final  . RBC (MIL/uL) 06/21/2011 4.90  3.87-5.11 Final  . Hemoglobin (g/dL) 16/04/9603  54.0  98.1-19.1 Final  . HCT (%) 06/21/2011 45.3  36.0-46.0 Final  . MCV (fL) 06/21/2011 92.4  78.0-100.0 Final  . MCH (pg) 06/21/2011 30.6  26.0-34.0 Final  . MCHC (g/dL) 47/82/9562 13.0  86.5-78.4 Final  . RDW (%) 06/21/2011 14.7  11.5-15.5 Final  . Platelets (K/uL) 06/21/2011 250  150-400 Final  . Squamous Epithelial / LPF  06/21/2011 MANY* RARE Final  . WBC, UA (WBC/hpf) 06/21/2011 11-20  <3 Final  . Bacteria, UA  06/21/2011 MANY* RARE Final    Basename 06/24/11 0345  HGB 12.3    Basename 06/24/11 0345  WBC 9.2  RBC 4.00  HCT 37.4  PLT 204    Basename 06/24/11 0345  NA 137  K 3.7  CL 100  CO2 33*  BUN 14  CREATININE 0.99  GLUCOSE 136*  CALCIUM 8.9    Basename 06/24/11 0345  LABPT --  INR 1.08    X-Rays:Dg Chest 2 View  06/21/2011  *RADIOLOGY REPORT*  Clinical Data: Preoperative respiratory films.  CHEST - 2 VIEW  Comparison: PA and lateral chest 04/02/2010.  Findings: Eventration of the left hemidiaphragm is unchanged. There is some scar in the left lung base.  Right lung is clear.  No pneumothorax or effusion.  Heart size is normal.  Surgical clips in the left axilla are noted.  IMPRESSION: No acute finding.  Original Report Authenticated By: Bernadene Bell. D'ALESSIO, M.D.    EKG: Orders placed during the hospital encounter of 06/23/11  . EKG 12-LEAD  . EKG 12-LEAD     Hospital Course: Patient was admitted to [facility] and taken to the OR and underwent the above state procedure without complications.  Patient tolerated the procedure well and was later transferred to the recovery room and then to the orthopaedic floor for postoperative care.  They were given PO and IV analgesics for pain control following their surgery.  They were given 24 hours of postoperative antibiotics and started on DVT prophylaxis.   PT and OT were ordered for total joint protocol.  Discharge planning consulted to help with postop disposition and equipment needs.  Patient had a rough  night on the evening of surgery due to pain but started to get up with therapy on day one.  PCA was discontinued and they were weaned over to PO meds.  Hemovac drain was pulled without difficulty. On the evening of day one and early morning of day two, patient was found to have fast rate but also confused. Hospitalist consulted but patient was uncooperative and refused EKG and vitals. By the next morning, her mental status has improved and EKG was ordered.  Dressing was changed on day two and the incision was healing well. Seen in rounds by Dr Lequita Halt and doing better.  Rate improved after receiving IV Digoxin.  At time of this summary, the EKG was pending but rate had improved.  The plan was to go to [facility] on Saturday (tomorrow) but has to be cleared form medical standpoint.  If improves and is cleared, then will transfer on Saturday.  Summary done for the facility which requested it today, Friday, to get med list.  Will need to check with Medicine because meds may be changed due to dysrhythmia issues postop.     Discharge Medications: Prior to Admission medications   Medication Sig Start Date End Date Taking? Authorizing Provider  carbidopa-levodopa (SINEMET) 25-100 MG per tablet Take 0.5 tablets by mouth 2 (two) times daily.    Yes Historical Provider, MD  clonazePAM (KLONOPIN) 0.5 MG tablet Take 0.25 mg by mouth at bedtime.    Yes Historical Provider, MD  hydrochlorothiazide (HYDRODIURIL) 25 MG tablet Take 25 mg by mouth daily.     Yes Historical Provider, MD  nortriptyline (PAMELOR) 10 MG capsule Take 30 mg by mouth at bedtime.    Yes Historical Provider, MD  QUEtiapine (SEROQUEL) 25 MG tablet Take 12.5 mg by mouth at bedtime.    Yes Historical Provider, MD  Ropinirole HCl (REQUIP XL) 12 MG TB24 Take 1 tablet by mouth at bedtime.    Yes Historical Provider, MD  traMADol (ULTRAM) 50 MG tablet Take 50 mg by mouth every 6 (six) hours as needed. Maximum dose= 8 tablets per day. PAIN    Yes Historical  Provider, MD  docusate sodium 100 MG CAPS Take 100 mg by mouth 2 (two) times daily. 06/25/11 07/05/11  Makynlie Rossini, PA  enoxaparin (LOVENOX) 30 MG/0.3ML SOLN Inject 0.3 mLs (30 mg total) into the skin every 12 (twelve) hours. 06/25/11   Fain Francis Julien Girt, PA  HYDROcodone-acetaminophen (NORCO) 5-325 MG per tablet Take 1 tablet by mouth every 6 (six) hours as needed (for breakthru pain). 06/25/11 07/05/11  Ithiel Liebler, PA  methocarbamol (ROBAXIN) 500 MG tablet Take 1 tablet (500 mg total) by mouth every 6 (six) hours as needed. 06/25/11 07/05/11  Myeshia Fojtik, PA  ondansetron (ZOFRAN) 4 MG tablet Take 1 tablet (4 mg total) by mouth every 6 (six) hours as needed for nausea. 06/25/11 07/02/11  Merrill Villarruel, PA    Diet: heart healthy  Activity:WBAT   Follow-up:in 2 weeks on Jan 3rd.  Disposition: Plan is to go to [facility] on Saturday if medically stable.  Discharged Condition: stable at time of summary completion.   Discharge Orders    Future Orders Please Complete By Expires   Diet - low sodium heart healthy      Call MD / Call 911      Comments:   If you experience chest pain or shortness of breath, CALL 911 and be transported to the hospital emergency room.  If you develope a fever above 101 F,  pus (white drainage) or increased drainage or redness at the wound, or calf pain, call your surgeon's office.   Constipation Prevention      Comments:   Drink plenty of fluids.  Prune juice may be helpful.  You may use a stool softener, such as Colace (over the counter) 100 mg twice a day.  Use MiraLax (over the counter) for constipation as needed.   Increase activity slowly as tolerated      Weight Bearing as taught in Physical Therapy      Comments:   Use a walker or crutches as instructed.   Discharge instructions      Comments:   Pick up stool softner and laxative for home. Do not submerge incision under water. May shower. Continue to use ice for pain  and swelling from surgery.    Driving restrictions      Comments:   No driving   Lifting restrictions      Comments:   No lifting   TED hose      Comments:   Use stockings (TED hose) for 3 weeks on both leg(s).  You may remove them at night for sleeping.   Change dressing      Comments:   Change dressing daily with sterile 4 x 4 inch gauze dressing and apply TED hose.   Do not put a pillow under the knee. Place it under the heel.        Current Discharge Medication List    START taking these medications   Details  docusate sodium 100 MG CAPS Take 100 mg by mouth 2 (two) times daily. Qty: 10 capsule, Refills: 0    enoxaparin (LOVENOX) 30 MG/0.3ML SOLN Inject 0.3 mLs (30 mg total) into the skin every 12 (twelve) hours. Qty: 8.4 mL, Refills: 0    HYDROcodone-acetaminophen (NORCO) 5-325 MG per tablet Take 1 tablet by mouth every 6 (six) hours as needed (for breakthru pain). Qty: 80 tablet, Refills: 0    methocarbamol (ROBAXIN) 500 MG tablet Take 1 tablet (500 mg total) by mouth every 6 (six) hours as needed. Qty: 80 tablet, Refills: 0    ondansetron (ZOFRAN) 4 MG tablet Take 1 tablet (4 mg total) by mouth every 6 (six) hours as needed for nausea. Qty: 30 tablet, Refills: 0      CONTINUE these medications which have NOT CHANGED   Details  carbidopa-levodopa (SINEMET) 25-100 MG per tablet Take 0.5 tablets by mouth 2 (two) times daily.     clonazePAM (KLONOPIN) 0.5 MG tablet Take 0.25 mg by mouth at bedtime.     hydrochlorothiazide (HYDRODIURIL) 25 MG tablet Take 25 mg by mouth daily.      nortriptyline (PAMELOR) 10 MG capsule Take 30 mg by mouth at bedtime.     QUEtiapine (SEROQUEL) 25 MG tablet Take 12.5 mg by mouth at bedtime.     Ropinirole HCl (REQUIP XL) 12 MG TB24 Take 1 tablet by mouth at bedtime.     traMADol (ULTRAM) 50 MG tablet Take 50 mg by mouth every 6 (six) hours as needed. Maximum dose= 8 tablets per day. PAIN       STOP taking these medications      acetaminophen (TYLENOL) 500 MG tablet      aspirin EC 81 MG tablet      Calcium-Vitamin D (CALTRATE 600 PLUS-VIT D PO)      fish oil-omega-3 fatty acids 1000 MG capsule      meloxicam (MOBIC) 15 MG tablet  Follow-up Information    Follow up with ALUISIO,FRANK V. Make an appointment in 2 weeks. (Please have SNF staff help arrange appointment for patient on Jan 3rd and help make arragnements for transportation.)    Contact information:   [facility] [address], [address] [place] [address] [phone]          Signed: Patrica Duel 06/25/2011, 9:35 AM

## 2011-06-25 NOTE — Progress Notes (Signed)
Subjective: 2 Days Post-Op Procedure(s) (LRB): TOTAL KNEE ARTHROPLASTY (Right) Patient reports pain as mild and moderate.   Patient seen in rounds with Dr. Lequita Halt. Patient has complaints of pain thru the night and also became confused.  Her heart rate increased into the 130's late last night. BP also became soft.  EKG ordered but patient refused and it was noted that patient seemed confused and uncooperative.  Attempted to contact family which was unsuccessful as per staff.  Medicine/Hospitalist called for evaluation of patient.   Patient continued to refuse studies and also vitals.  She was given IV digoxin.  Patient was better this morning on rounds when seen with Dr. Lequita Halt.  She was more cooperative and seemed back to baseline with her mental status.  EKG was ordered and will check hear rhythm status.  Greatly appreciate Medicine help with this patient.   Objective: Vital signs in last 24 hours: Temp:  [98.3 F (36.8 C)-99.8 F (37.7 C)] 98.8 F (37.1 C) (12/21 0600) Pulse Rate:  [96-130] 101  (12/21 0600) Resp:  [18] 18  (12/21 0600) BP: (82-142)/(55-73) 98/65 mmHg (12/21 0600) SpO2:  [93 %-98 %] 98 % (12/21 0600)  Intake/Output from previous day:  Intake/Output Summary (Last 24 hours) at 06/25/11 0815 Last data filed at 06/25/11 0500  Gross per 24 hour  Intake    850 ml  Output   1350 ml  Net   -500 ml    Intake/Output this shift: 200 cc since midnight.  Labs:  [facility] 06/24/11 0345  HGB 12.3    Basename 06/24/11 0345  WBC 9.2  RBC 4.00  HCT 37.4  PLT 204    Basename 06/24/11 0345  NA 137  K 3.7  CL 100  CO2 33*  BUN 14  CREATININE 0.99  GLUCOSE 136*  CALCIUM 8.9    Basename 06/24/11 0345  LABPT --  INR 1.08    Exam - Neurovascular intact Sensation intact distally Dressing/Incision - clean, dry, no drainage Motor function intact - moving foot and toes well on exam.   Assessment/Plan: 2 Days Post-Op Procedure(s) (LRB): TOTAL KNEE ARTHROPLASTY  (Right)  Past Medical History  Diagnosis Date  . Dysrhythmia     atrial fibrilliation  . Hypertension     hyperlipidemia-  TEE,CARDIOLYTE STRESS 11/12 with LOV  DR TILLEY AND EKG ON CHART  . Peripheral vascular disease     Hx   DVT x 2  2000  . H/O hiatal hernia   . Neuromuscular disorder     Parkinsons, managed by Dr Anne Hahn-  LOV 11/12  . Bladder infection     06/10/11 with treatment by Dr Timothy Lasso  . Arthritis   . Cancer     2000- breast cancer followed by 6 months of chemo, radiation and  PO meds   Probably A.fib with RVR - checking EKG this AM.  Rate has already improved and may be back in NSR due to medical intervention.  Patient more cooperative this morning. Postop Hyptension - likely due to probable A.Fib, given fluids.  Monitor BP as long as  She remains cooperative. Monitor I & O's.  Will keep foley for now.  If converts back to NSR and good pressure, then will remove foley this afternoon, but keep for now. Postop Altered Mental Status - decreased narcotics, probably multifactorial with pressure, rate, and meds.  Up with therapy Disposition - patient want [facility] at [employer] - Social worker to assist with placement.  Tomorrow would be the earliest  but all the above medical issues will need to be resolved.  Will wait and see recommendations form Medicine/Hospitalists  DVT Prophylaxis - Lovenox and Coumadin Protocol - INR is 1.08 this am.  Continue Lovenox until INR therapeutic. Weight-Bearing as tolerated to right  leg  PERKINS, ALEXZANDREW 06/25/2011, 8:15 AM

## 2011-06-25 NOTE — Progress Notes (Signed)
  PT deferred after multiple attempts.  Pt with hallucinations overnight, c/o fatigue when not asleep, with portable X-Ray, with hospitalist, and being tranfered to telemetry bed.  Will follow in am and progress as tolerated.

## 2011-06-25 NOTE — Progress Notes (Addendum)
Bolus finished per order but patient refuses to allow her blood pressure to be rechecked.  Family called again with no answer at 0500.  Patient is calm and appears asymptomatic.  Will continue to monitor, will attempt to recheck blood pressure at a later time.  MD aware that patient is not allowing EKG and blood pressure to be performed.

## 2011-06-25 NOTE — Progress Notes (Addendum)
ANTICOAGULATION CONSULT NOTE - Follow Up Consult  Pharmacy Consult for Coumadin Indication: VTE prophylaxis s/p TKA  No Known Allergies  Patient Measurements: Height: 5\' 4"  (162.6 cm) Weight: 198 lb (89.812 kg) IBW/kg (Calculated) : 54.7    Vital Signs: Temp: 98.8 F (37.1 C) (12/21 0600) Temp src: Oral (12/21 0600) BP: 98/65 mmHg (12/21 0600) Pulse Rate: 101  (12/21 0600)  Labs:  Basename 06/25/11 1030 06/24/11 0345  HGB -- 12.3  HCT -- 37.4  PLT -- 204  APTT -- --  LABPROT 19.2* 14.2  INR 1.58* 1.08  HEPARINUNFRC -- --  CREATININE -- 0.99  CKTOTAL -- --  CKMB -- --  TROPONINI -- --   Estimated Creatinine Clearance: 50 ml/min (by C-G formula based on Cr of 0.99).   Medications:  Scheduled:     . carbidopa-levodopa  0.5 tablet Oral BID  . clonazePAM  0.25 mg Oral QHS  . digoxin  0.5 mg Intravenous Once  . docusate sodium  100 mg Oral BID  . enoxaparin  30 mg Subcutaneous Q12H  . nortriptyline  10 mg Oral TID  . nortriptyline  30 mg Oral QHS  . QUEtiapine  12.5 mg Oral QHS  . Ropinirole HCl  1 tablet Oral QHS  . sodium chloride  500 mL Intravenous Once  . sodium chloride  500 mL Intravenous Once  . warfarin  5 mg Oral ONCE-1800  . DISCONTD: digoxin  0.5 mg Intravenous Once  . DISCONTD: hydrochlorothiazide  25 mg Oral Daily  . DISCONTD: metoprolol  5 mg Intravenous Once   PRN: acetaminophen, acetaminophen, haloperidol lactate, HYDROcodone-acetaminophen, menthol-cetylpyridinium, methocarbamol(ROBAXIN) IV, methocarbamol, metoCLOPramide (REGLAN) injection, metoCLOPramide, ondansetron (ZOFRAN) IV, ondansetron, phenol, traMADol, DISCONTD: diphenhydrAMINE, DISCONTD: diphenhydrAMINE, DISCONTD: diphenhydrAMINE, DISCONTD:  morphine injection, DISCONTD: ondansetron (ZOFRAN) IV, DISCONTD: oxyCODONE, DISCONTD: temazepam  Assessment: -77 yo F s/p R TKA.  Hx of DVT x2 noted. -INR rising after warfarin 7.5mg , 5mg  PO -Also on prophylactic-dose Lovenox until INR  therapeutic. -Episode of tachycardia last night; has hx of atrial fibrillation.     Goal of Therapy:  INR 2-3   Plan:  -Warfarin 5mg  PO today. -Continue Lovenox until INR>2 x 24 hrs, per new MD order. -Remains on prophylactic dosage of Lovenox for now; await cardiology evaluation and orders.  Elie Goody, PharmD  [phone] 06/25/2011 12:20 PM

## 2011-06-25 NOTE — H&P (Addendum)
Consult requested; Dr. Lequita Halt Cardiologist;  Reason for consult; tachycardia and hypotension  HPI: This a 77 year old female who underwent a total knee arthroplasty 12/19, per cardiology note she has a history of atrial fibrillation. Today she was noted to be tachycardic with heart rates of 113, she was monitored on the floor, tonight at approximately 1 AM patient developed increased tachycardia heart rate in the 130s and systolic blood pressure in 80s. Patient appears asymptomatic. Currently 500 cc bolus of IV fluids is running. Patient has refused EKGs or any intervention stating she needs the approval of her cardiologist. Attempts have been made to contact family, however this is been unsuccessful. In this setting the hospitalist service was consulted with a request for management. On my interview with the patient, the patient calmly declines to answer  questions pertaining to assessment of her orientation stating she does not know why she she needs to discuss that with me. She also declines any physical examination. Patient is confused I'm sure, but appears to have adequate brain perfusion, there is no somnolence. Patient is on ortho floor, which is a nonmonitored floor.   Review of Systems:  Unable to obtain due to patient's refusal  Past Medical History: Past Medical History  Diagnosis Date  . Dysrhythmia     atrial fibrilliation  . Hypertension     hyperlipidemia-  TEE,CARDIOLYTE STRESS 11/12 with LOV  DR TILLEY AND EKG ON CHART  . Peripheral vascular disease     Hx   DVT x 2  2000  . H/O hiatal hernia   . Neuromuscular disorder     Parkinsons, managed by Dr Anne Hahn-  LOV 11/12  . Bladder infection     06/10/11 with treatment by Dr Timothy Lasso  . Arthritis   . Cancer     2000- breast cancer followed by 6 months of chemo, radiation and  PO meds   Past Surgical History  Procedure Date  . Breast surgery     LUMPECTOMY  WITH AXILLIARY DISSECTION   2000  . Back surgery     lumbar x 3  ?  type  . Tonsillectomy   . Cardiac catheterization     bilateral cataract extraction with IOL  . Cholecystectomy   . Joint replacement     RIGHT HIP REPLACEMENT   1996    Medications: Prior to Admission medications   Medication Sig Start Date End Date Taking? Authorizing Provider  acetaminophen (TYLENOL) 500 MG tablet Take 1,000 mg by mouth every 6 (six) hours as needed. PAIN     Yes Historical Provider, MD  aspirin EC 81 MG tablet Take 81 mg by mouth daily.    Yes Historical Provider, MD  Calcium-Vitamin D (CALTRATE 600 PLUS-VIT D PO) Take 1 tablet by mouth daily.    Yes Historical Provider, MD  carbidopa-levodopa (SINEMET) 25-100 MG per tablet Take 0.5 tablets by mouth 2 (two) times daily.    Yes Historical Provider, MD  clonazePAM (KLONOPIN) 0.5 MG tablet Take 0.25 mg by mouth at bedtime.    Yes Historical Provider, MD  fish oil-omega-3 fatty acids 1000 MG capsule Take 1 g by mouth daily.    Yes Historical Provider, MD  hydrochlorothiazide (HYDRODIURIL) 25 MG tablet Take 25 mg by mouth daily.     Yes Historical Provider, MD  meloxicam (MOBIC) 15 MG tablet Take 15 mg by mouth daily.    Yes Historical Provider, MD  nortriptyline (PAMELOR) 10 MG capsule Take 30 mg by mouth at bedtime.  Yes Historical Provider, MD  QUEtiapine (SEROQUEL) 25 MG tablet Take 12.5 mg by mouth at bedtime.    Yes Historical Provider, MD  Ropinirole HCl (REQUIP XL) 12 MG TB24 Take 1 tablet by mouth at bedtime.    Yes Historical Provider, MD  traMADol (ULTRAM) 50 MG tablet Take 50 mg by mouth every 6 (six) hours as needed. Maximum dose= 8 tablets per day. PAIN    Yes Historical Provider, MD    Allergies:  No Known Allergies  Social History:  reports that she has never smoked. She has never used smokeless tobacco. She reports that she does not drink alcohol or use illicit drugs.  Family History: Unable to obtain  Physical Exam: Filed Vitals:   06/24/11 2115 06/25/11 0040 06/25/11 0239 06/25/11 0254    BP: 108/67  88/68 82/55  Pulse: 114 109 116 123  Temp: 99.8 F (37.7 C)  98.7 F (37.1 C)   TempSrc: Oral  Oral   Resp: 18  18   Height:      Weight:      SpO2: 95% 93% 96%     General:  Alert female confused, well developed and nourished, no acute distress Eyes: PERRLA, pink conjunctiva, no scleral icterus ENT: Moist oral mucosa,  Lungs/ Cardiovascular/unable to assess Abdomen/Neuro/Musculoskeletal: Unable to assess Skin: no rash, no subcutaneous crepitation,  Psych: awake patient   Labs on Admission:   [facility] 06/24/11 0345  NA 137  K 3.7  CL 100  CO2 33*  GLUCOSE 136*  BUN 14  CREATININE 0.99  CALCIUM 8.9  MG --  PHOS --   No results found for this basename: AST:2,ALT:2,ALKPHOS:2,BILITOT:2,PROT:2,ALBUMIN:2 in the last 72 hours No results found for this basename: LIPASE:2,AMYLASE:2 in the last 72 hours  Basename 06/24/11 0345  WBC 9.2  NEUTROABS --  HGB 12.3  HCT 37.4  MCV 93.5  PLT 204   No results found for this basename: CKTOTAL:3,CKMB:3,CKMBINDEX:3,TROPONINI:3 in the last 72 hours No results found for this basename: TSH,T4TOTAL,FREET3,T3FREE,THYROIDAB in the last 72 hours No results found for this basename: VITAMINB12:2,FOLATE:2,FERRITIN:2,TIBC:2,IRON:2,RETICCTPCT:2 in the last 72 hours  Radiological Exams on Admission: No results found.  Assessment/Plan Present on Admission:   tachycardia Likely recurrent atrial fibrillation, unable to do an EKG Patient needs to be transferred to the monitored floor, but patient will likely refused in the monitoring Repeat blood pressure post bolus, blood pressure may be improved. Also continue IV fluid hydration Would recommend bolus with digoxin, with patient's blood pressure unable to give beta blocker or calcium channel blockers. Additionally, without knowing patient's actual rhythm this is the safest. Consult patient's cardiologist  Likely developing delirium Parkinson's disease  Hyperlipidemia Restless  leg syndrome History of DVT and PE -Stable Status post total knee arthroplasty   Team 3/Dr. Nadara Mustard, Jowell Bossi 06/25/2011, 4:40 AM

## 2011-06-25 NOTE — Progress Notes (Signed)
Blood Pressure during vital check found to be 88/68.  Heart rate 116.  Patient alert, comfortable. MD on call Norval Gable PA) notified. Order given to obtain EKG.  Patient refused to have EKG performed, says she "does not think its necessary." Attempted to call patient's husband called using the home and mobile number listed, no answer, a message was left at the home number, no voicemail mailbox available on the mobile number. Norval Gable PA ordered bolus and called medical consult.  Hospitalist MD on the floor at 0420 to the see the patient in her room. Will continue to monitor.

## 2011-06-25 NOTE — Progress Notes (Signed)
Bed available at [facility] ) tomorrow if pt is stable for D/C. SNF bed will be available on Monday , as well. Will follow to assist with D/C planning to SNF.

## 2011-06-25 NOTE — Consult Note (Signed)
Patient Identification:  Angel Greene Date of Evaluation:  06/25/2011   History of Present Illness:   77 year old Caucasian female who underwent a decent total knee arthroplasty has a history of parkinsonism disease. I was told to see the patient for a confusion. Patient is pleasantly confused no agitation present patient's husband told me that the she was started recently on medication for parkinsonism and after that she started hallucinating. Patient does not have any psych history in the past. When I asked patient why she is in the hospital she told me because of the heart problem.   Mental Status Examination: patient is confused but does not seem to be hallucinating or delusional during interview, she is calm cooperative attention concentration poor abstract ability poor insight and judgment poor   Past Medical History:     Past Medical History  Diagnosis Date  . Dysrhythmia     atrial fibrilliation  . Hypertension     hyperlipidemia-  TEE,CARDIOLYTE STRESS 11/12 with LOV  DR TILLEY AND EKG ON CHART  . Peripheral vascular disease     Hx   DVT x 2  2000  . H/O hiatal hernia   . Neuromuscular disorder     Parkinsons, managed by Dr Anne Hahn-  LOV 11/12  . Bladder infection     06/10/11 with treatment by Dr Timothy Lasso  . Arthritis   . Cancer     2000- breast cancer followed by 6 months of chemo, radiation and  PO meds       Past Surgical History  Procedure Date  . Breast surgery     LUMPECTOMY  WITH AXILLIARY DISSECTION   2000  . Back surgery     lumbar x 3  ? type  . Tonsillectomy   . Cardiac catheterization     bilateral cataract extraction with IOL  . Cholecystectomy   . Joint replacement     RIGHT HIP REPLACEMENT   1996    Filed Vitals:   06/25/11 0600  BP: 98/65  Pulse: 101  Temp: 98.8 F (37.1 C)  Resp: 18    Lab Results:   BMET    Component Value Date/Time   NA 137 06/24/2011 0345   K 3.7 06/24/2011 0345   CL 100 06/24/2011 0345   CO2 33* 06/24/2011  0345   GLUCOSE 136* 06/24/2011 0345   BUN 14 06/24/2011 0345   CREATININE 0.99 06/24/2011 0345   CALCIUM 8.9 06/24/2011 0345   GFRNONAA 53* 06/24/2011 0345   GFRAA 61* 06/24/2011 0345    Allergies: No Known Allergies  Current Medications:  Prior to Admission medications   Medication Sig Start Date End Date Taking? Authorizing Provider  carbidopa-levodopa (SINEMET) 25-100 MG per tablet Take 0.5 tablets by mouth 2 (two) times daily.    Yes Historical Provider, MD  clonazePAM (KLONOPIN) 0.5 MG tablet Take 0.25 mg by mouth at bedtime.    Yes Historical Provider, MD  hydrochlorothiazide (HYDRODIURIL) 25 MG tablet Take 25 mg by mouth daily.     Yes Historical Provider, MD  nortriptyline (PAMELOR) 10 MG capsule Take 30 mg by mouth at bedtime.    Yes Historical Provider, MD  QUEtiapine (SEROQUEL) 25 MG tablet Take 12.5 mg by mouth at bedtime.    Yes Historical Provider, MD  Ropinirole HCl (REQUIP XL) 12 MG TB24 Take 1 tablet by mouth at bedtime.    Yes Historical Provider, MD  traMADol (ULTRAM) 50 MG tablet Take 50 mg by mouth every 6 (six) hours as needed.  Maximum dose= 8 tablets per day. PAIN    Yes Historical Provider, MD  docusate sodium 100 MG CAPS Take 100 mg by mouth 2 (two) times daily. 06/25/11 07/05/11  Alexzandrew Perkins, PA  enoxaparin (LOVENOX) 30 MG/0.3ML SOLN Inject 0.3 mLs (30 mg total) into the skin every 12 (twelve) hours. 06/25/11   Alexzandrew Julien Girt, PA  HYDROcodone-acetaminophen (NORCO) 5-325 MG per tablet Take 1 tablet by mouth every 6 (six) hours as needed (for breakthru pain). 06/25/11 07/05/11  Alexzandrew Perkins, PA  methocarbamol (ROBAXIN) 500 MG tablet Take 1 tablet (500 mg total) by mouth every 6 (six) hours as needed. 06/25/11 07/05/11  Alexzandrew Perkins, PA  ondansetron (ZOFRAN) 4 MG tablet Take 1 tablet (4 mg total) by mouth every 6 (six) hours as needed for nausea. 06/25/11 07/02/11  Alexzandrew Julien Girt, PA    Social History:    reports that she has never  smoked. She has never used smokeless tobacco. She reports that she does not drink alcohol or use illicit drugs.   Family History:    History reviewed. No pertinent family history.   DIAGNOSIS:   AXIS I  delirium NOS   AXIS II  Deffered  AXIS III See medical notes.  AXIS IV  chronic medical issues   AXIS V 40     Recommendations:  Patient can be continued on the current treatment plan because of history atrial fibrillation I would not recommend not to increase the Seroquel her parkinsonism medication can be readjusted.  I will followup on this patient as needed   Eulogio Ditch, MD

## 2011-06-25 NOTE — Progress Notes (Signed)
Patient reassessed, remains stable. Still refuses evaluation, her EKG or cardiac monitoring. She did receive digoxin

## 2011-06-25 NOTE — Progress Notes (Addendum)
Angel Greene NFA:[id number],ION:[id number] is a 77 y.o. female,  Outpatient Primary MD for the patient is No primary provider on file.  No chief complaint on file.       Subjective:   [facility] today has, No headache, No chest pain, No abdominal pain - No Nausea, No new weakness tingling or numbness, No Cough - SOB.    Objective:   Filed Vitals:   06/25/11 0040 06/25/11 0239 06/25/11 0254 06/25/11 0600  BP:  88/68 82/55 98/65   Pulse: 109 116 123 101  Temp:  98.7 F (37.1 C)  98.8 F (37.1 C)  TempSrc:  Oral  Oral  Resp:  18  18  Height:      Weight:      SpO2: 93% 96%  98%    Wt Readings from Last 3 Encounters:  06/24/11 89.812 kg (198 lb)  06/24/11 89.812 kg (198 lb)  06/21/11 89.812 kg (198 lb)     Intake/Output Summary (Last 24 hours) at 06/25/11 1148 Last data filed at 06/25/11 0500  Gross per 24 hour  Intake    610 ml  Output   1000 ml  Net   -390 ml    Exam Awake, delirium improved., No new F.N deficits, Normal affect [place].AT,PERRAL Supple Neck,No JVD, No cervical lymphadenopathy appriciated.  Symmetrical Chest wall movement, Good air movement bilaterally, CTAB RRR,No Gallops,Rubs or new Murmurs, No Parasternal Heave +ve B.Sounds, Abd Soft, Non tender, No organomegaly appriciated, No rebound -guarding or rigidity. No Cyanosis, Clubbing or edema, No new Rash or bruise.  Data Review  CBC  Lab 06/24/11 0345 06/21/11 0950  WBC 9.2 5.5  HGB 12.3 15.0  HCT 37.4 45.3  PLT 204 250  MCV 93.5 92.4  MCH 30.8 30.6  MCHC 32.9 33.1  RDW 14.8 14.7  LYMPHSABS -- 1.7  MONOABS -- 0.4  EOSABS -- 0.2  BASOSABS -- 0.0  BANDABS -- --    Chemistries   Lab 06/24/11 0345  NA 137  K 3.7  CL 100  CO2 33*  GLUCOSE 136*  BUN 14  CREATININE 0.99  CALCIUM 8.9  MG --  AST --  ALT --  ALKPHOS --  BILITOT --   ------------------------------------------------------------------------------------------------------------------ estimated creatinine clearance is 50  ml/min (by C-G formula based on Cr of 0.99). ------------------------------------------------------------------------------------------------------------------ No results found for this basename: HGBA1C:2 in the last 72 hours ------------------------------------------------------------------------------------------------------------------ No results found for this basename: CHOL:2,HDL:2,LDLCALC:2,TRIG:2,CHOLHDL:2,LDLDIRECT:2 in the last 72 hours ------------------------------------------------------------------------------------------------------------------ No results found for this basename: TSH,T4TOTAL,FREET3,T3FREE,THYROIDAB in the last 72 hours ------------------------------------------------------------------------------------------------------------------ No results found for this basename: VITAMINB12:2,FOLATE:2,FERRITIN:2,TIBC:2,IRON:2,RETICCTPCT:2 in the last 72 hours  Coagulation profile  Lab 06/25/11 1030 06/24/11 0345 06/21/11 0950  INR 1.58* 1.08 0.94  PROTIME -- -- --    No results found for this basename: DDIMER:2 in the last 72 hours  Cardiac Enzymes No results found for this basename: CK:3,CKMB:3,TROPONINI:3,MYOGLOBIN:3 in the last 168 hours ------------------------------------------------------------------------------------------------------------------ No components found with this basename: POCBNP:3  Micro Results Recent Results (from the past 240 hour(s))  SURGICAL PCR SCREEN     Status: Normal   Collection Time   06/21/11  9:46 AM      Component Value Range Status Comment   MRSA, PCR NEGATIVE  NEGATIVE  Final    Staphylococcus aureus NEGATIVE  NEGATIVE  Final   URINE CULTURE     Status: Normal   Collection Time   06/23/11  1:45 PM      Component Value Range Status Comment   Specimen Description URINE, CATHETERIZED  Final    Special Requests NONE   Final    Setup Time [id number]   Final    Colony Count NO GROWTH   Final    Culture NO GROWTH   Final     Report Status 06/24/2011 FINAL   Final     Radiology Reports Dg Chest 2 View  06/21/2011  *RADIOLOGY REPORT*  Clinical Data: Preoperative respiratory films.  CHEST - 2 VIEW  Comparison: PA and lateral chest 04/02/2010.  Findings: Eventration of the left hemidiaphragm is unchanged. There is some scar in the left lung base.  Right lung is clear.  No pneumothorax or effusion.  Heart size is normal.  Surgical clips in the left axilla are noted.  IMPRESSION: No acute finding.  Original Report Authenticated By: Bernadene Bell. D'ALESSIO, M.D.    Scheduled Meds:   . carbidopa-levodopa  0.5 tablet Oral BID  . clonazePAM  0.25 mg Oral QHS  . digoxin  0.5 mg Intravenous Once  . docusate sodium  100 mg Oral BID  . enoxaparin  30 mg Subcutaneous Q12H  . nortriptyline  10 mg Oral TID  . nortriptyline  30 mg Oral QHS  . QUEtiapine  12.5 mg Oral QHS  . Ropinirole HCl  1 tablet Oral QHS  . sodium chloride  500 mL Intravenous Once  . sodium chloride  500 mL Intravenous Once  . warfarin  5 mg Oral ONCE-1800  . warfarin   Does not apply Once  . DISCONTD: digoxin  0.5 mg Intravenous Once  . DISCONTD: hydrochlorothiazide  25 mg Oral Daily  . DISCONTD: metoprolol  5 mg Intravenous Once   Continuous Infusions:   . sodium chloride    . DISCONTD: 0.45 % NaCl with KCl 20 mEq / L 100 mL/hr at 06/25/11 0603  . DISCONTD: bupivacaine ON-Q pain pump     PRN Meds:.acetaminophen, acetaminophen, haloperidol lactate, HYDROcodone-acetaminophen, menthol-cetylpyridinium, methocarbamol(ROBAXIN) IV, methocarbamol, metoCLOPramide (REGLAN) injection, metoCLOPramide, ondansetron (ZOFRAN) IV, ondansetron, phenol, traMADol, DISCONTD: diphenhydrAMINE, DISCONTD: diphenhydrAMINE, DISCONTD: diphenhydrAMINE, DISCONTD:  morphine injection, DISCONTD: ondansetron (ZOFRAN) IV, DISCONTD: oxyCODONE, DISCONTD: temazepam  Assessment & Plan   Principal Problem:  1. Rt Knee surgery for  Advanced OA (osteoarthritis) of knee - per dr  Despina Hick.  2. Tachycardia and hypotension 06-24-11 - H/O Afib - evaluation last night was not possible as pt refused EKG-Tele due to delirium - EKG this am is NSR, was placed on Dig last night will check a level, ? Was it S.Tach due to agitation, will repeat EKG, CXR, Tele monitor, IVF to NS, Haldol for Delirium, check TSH. D/W pts Cardiologist Dr Donnie Aho who will see the pt. On coumadin - pharm consulted.  3. Delirium - some at home too per husband, Psych consulted, PRN Haldol, with Qtc monitor.  4. Parkinson disease - Home meds and outpt Neuro follow.   DVT Prophylaxis  Lovenox - Coumadin - SCDs  See all Orders from today for further details   Time spent 35 minutes in seeing, examining patient, and over half of the total time was spent in coordinating patient care on the floor or bedisde.  Leroy Sea M.D on 06/25/2011,at 11:48 AM  [facility] Office  [phone]

## 2011-06-25 NOTE — Consult Note (Signed)
Admit date: 06/23/2011 Name: Angel Greene DOB:  18-Dec-1929 MRN:  [id number]  Referring Physician: [facility]  Primary Physician: Dr. Creola Corn  Primary Cardiologist: Dr. Viann Fish  Reason for Consultation: Rapid heartbeat  HPI:   This 77 year old woman is known to me. I saw her preoperative prior to planned knee surgery.she has a history of breast cancer treated with surgery, radiation and chemotherapy and was hospitalized with urosepsis and may have had mild atrial fibrillation. She has severe bilateral knee pain and also significant Parkinson's with some restless leg syndrome and some hallucinations at night. She had some mild dyspnea when she would exert herself. Preoperative cardiac evaluation was done in November with lower limits of normal ejection fraction by echocardiogram and an EF of 39% with Cardiolite testing but no evidence of ischemia. She was cleared for surgery and underwent knee replacement on 12/19. Last evening she became somewhat hypotensive and became tachycardic but was refusing an EKG and refused to be transferred to the floor. She has no recollection of those events. An EKG was finally done this morning she was in sinus tachycardia and a repeat EKG this afternoon shows sinus rhythm. The patient is somewhat somnolent but does not have any chest pain or shortness of breath. She does desaturate if she talks rapidly. I was asked to assess her.   Past Medical History  Diagnosis Date  . Dysrhythmia     atrial fibrilliation  . Hypertension     hyperlipidemia-  TEE,CARDIOLYTE STRESS 11/12 with LOV  DR Feliciana Narayan AND EKG ON CHART  . Peripheral vascular disease     Hx   DVT x 2  2000  . H/O hiatal hernia   . Neuromuscular disorder     Parkinsons, managed by Dr Anne Hahn-  LOV 11/12  . Bladder infection     06/10/11 with treatment by Dr Timothy Lasso  . Arthritis   . Cancer     2000- breast cancer followed by 6 months of chemo, radiation and  PO meds      Past Surgical  History  Procedure Date  . Breast surgery     LUMPECTOMY  WITH AXILLIARY DISSECTION   2000  . Back surgery     lumbar x 3  ? type  . Tonsillectomy   . Cardiac catheterization     bilateral cataract extraction with IOL  . Cholecystectomy   . Joint replacement     RIGHT HIP REPLACEMENT   1996    Allergies:   has no known allergies.   Medications:   . carbidopa-levodopa  0.5 tablet Oral BID  . clonazePAM  0.25 mg Oral QHS  . digoxin  0.5 mg Intravenous Once  . docusate sodium  100 mg Oral BID  . enoxaparin  30 mg Subcutaneous Q12H  . nortriptyline  10 mg Oral TID  . nortriptyline  30 mg Oral QHS  . QUEtiapine  12.5 mg Oral QHS  . Ropinirole HCl  1 tablet Oral QHS  . sodium chloride  500 mL Intravenous Once  . warfarin  5 mg Oral ONCE-1800  . warfarin  5 mg Oral ONCE-1800    Family History:  family history is not on file.  Social History:   reports that she has never smoked. She has never used smokeless tobacco. She reports that she does not drink alcohol or use illicit drugs.  Review of Systems: Other than as noted above is unremarkable at the present time.  Physical Exam: Blood pressure 103/65, pulse 88, temperature  98.6 F (37 C), temperature source Oral, resp. rate 18, height 5\' 4"  (1.626 m), weight 89.812 kg (198 lb), SpO2 97.00%.    General appearance: alert, appears stated age, mild distress and pale she is lying in bed and appears confused at times but didn't know who I was. Head: Normocephalic, without obvious abnormality, atraumatic Neck: no adenopathy, no carotid bruit, no JVD, supple, symmetrical, trachea midline and thyroid not enlarged, symmetric, no tenderness/mass/nodules Lungs: rales RUL Heart: regular rate and rhythm, S1, S2 normal, no murmur, click, rub or gallop Abdomen: soft, non-tender; bowel sounds normal; no masses,  no organomegaly Neurologic: Grossly normal  Labs: Results for orders placed during the hospital encounter of 06/23/11 (from the  past 24 hour(s))  PROTIME-INR     Status: Abnormal   Collection Time   06/25/11 10:30 AM      Component Value Range   Prothrombin Time 19.2 (*) 11.6 - 15.2 (seconds)   INR 1.58 (*) 0.00 - 1.49   DIGOXIN LEVEL     Status: Normal   Collection Time   06/25/11  2:48 PM      Component Value Range   Digoxin Level 0.8  0.8 - 2.0 (ng/mL)    EKG:  Sinus tachycardia. There is significant T-wave changes in the anterior leads on EKG earlier today  IMPRESSIONS:  1. Tachycardia and hypotension earlier today. One would need to be concerned about whether she had a pulmonary embolus because she is at high risk for this and has a prior history of DVT. 2. Sinus tachycardia that may be due 2 delirium or could be due to other underlying condition 3. Cardiomyopathy with mildly depressed LV function 4. Significant Parkinson's 5. Significant lumbar disc disease and low back pain  RECOMMENDATION:  I would move her to telemetry at this time and check some serial enzymes as well as a d-dimer. Continue warfarin therapy would have a low tolerance for systemic anticoagulation. Observe saturations.  Signed:  Darden Palmer MD [facility]   Cardiology Consultant 06/25/2011, 5:47 PM

## 2011-06-26 ENCOUNTER — Other Ambulatory Visit: Payer: Self-pay

## 2011-06-26 LAB — BASIC METABOLIC PANEL
BUN: 9 mg/dL (ref 6–23)
Calcium: 8.5 mg/dL (ref 8.4–10.5)
GFR calc non Af Amer: 77 mL/min — ABNORMAL LOW (ref >90)
Glucose, Bld: 102 mg/dL — ABNORMAL HIGH (ref 70–99)

## 2011-06-26 LAB — CBC
MCH: 31 pg (ref 26.0–34.0)
MCV: 93.6 fL (ref 78.0–100.0)
Platelets: 168 10*3/uL (ref 150–400)
RDW: 14.8 % (ref 11.5–15.5)
WBC: 8.8 10*3/uL (ref 4.0–10.5)

## 2011-06-26 LAB — CARDIAC PANEL(CRET KIN+CKTOT+MB+TROPI)
CK, MB: 4.1 ng/mL — ABNORMAL HIGH (ref 0.3–4.0)
Troponin I: <0.3 ng/mL (ref <0.30)
Troponin I: <0.3 ng/mL (ref <0.30)

## 2011-06-26 LAB — PROTIME-INR: Prothrombin Time: 21.3 seconds — ABNORMAL HIGH (ref 11.6–15.2)

## 2011-06-26 MED ORDER — POTASSIUM CHLORIDE CRYS ER 20 MEQ PO TBCR
40.0000 meq | EXTENDED_RELEASE_TABLET | Freq: Once | ORAL | Status: AC
  Administered 2011-06-26: 40 meq via ORAL
  Filled 2011-06-26: qty 2

## 2011-06-26 MED ORDER — WARFARIN SODIUM 5 MG PO TABS
5.0000 mg | ORAL_TABLET | Freq: Once | ORAL | Status: AC
  Administered 2011-06-26: 5 mg via ORAL
  Filled 2011-06-26: qty 1

## 2011-06-26 NOTE — Progress Notes (Signed)
Orthopedics Progress Note  Subjective: Mild pain to right knee. Pt sent to telemetry overnight to reassess.   Objective:  Filed Vitals:   06/26/11 0556  BP: 109/70  Pulse: 101  Temp: 97.4 F (36.3 C)  Resp: 20    General: Awake and alert  Musculoskeletal: right knee incision healing well, nv intact distally, minimal edema distally Neurovascularly intact  Lab Results  Component Value Date   WBC 8.8 06/26/2011   HGB 10.7* 06/26/2011   HCT 32.3* 06/26/2011   MCV 93.6 06/26/2011   PLT 168 06/26/2011       Component Value Date/Time   NA 136 06/26/2011 0635   K 3.4* 06/26/2011 0635   CL 99 06/26/2011 0635   CO2 31 06/26/2011 0635   GLUCOSE 102* 06/26/2011 0635   BUN 9 06/26/2011 0635   CREATININE 0.78 06/26/2011 0635   CALCIUM 8.5 06/26/2011 0635   GFRNONAA 77* 06/26/2011 0635   GFRAA 90* 06/26/2011 0635    Lab Results  Component Value Date   INR 1.81* 06/26/2011   INR 1.58* 06/25/2011   INR 1.08 06/24/2011    Assessment/Plan: POD #3 s/p Procedure(s): TOTAL KNEE ARTHROPLASTY  Plan: pt will need snf once cleared from a medical standpoint. Continue current treatment and PT/OT as tolerated  Almedia Balls. Ranell Patrick, MD 06/26/2011 9:05 AM

## 2011-06-26 NOTE — Progress Notes (Signed)
ANTICOAGULATION CONSULT NOTE - Follow Up Consult  Pharmacy Consult for Coumadin Indication: VTE prophylaxis s/p TKA  No Known Allergies  Patient Measurements: Height: 5\' 4"  (162.6 cm) Weight: 198 lb (89.812 kg) IBW/kg (Calculated) : 54.7    Vital Signs: Temp: 97.4 F (36.3 C) (12/22 0556) Temp src: Oral (12/22 0556) BP: 109/70 mmHg (12/22 0556) Pulse Rate: 101  (12/22 0556)  Labs:  Basename 06/26/11 0635 06/26/11 0101 06/25/11 1850 06/25/11 1720 06/25/11 1030 06/24/11 0345  HGB 10.7* -- -- 10.4* -- --  HCT 32.3* -- -- 32.3* -- 37.4  PLT 168 -- -- 150 -- 204  APTT -- -- -- -- -- --  LABPROT 21.3* -- -- -- 19.2* 14.2  INR 1.81* -- -- -- 1.58* 1.08  HEPARINUNFRC -- -- -- -- -- --  CREATININE 0.78 -- -- 0.85 -- 0.99  CKTOTAL 102 116 148 -- -- --  CKMB 4.1* 3.8 4.6* -- -- --  TROPONINI <0.30 <0.30 <0.30 -- -- --   Estimated Creatinine Clearance: 61.8 ml/min (by C-G formula based on Cr of 0.78).   Medications:  Scheduled:    . carbidopa-levodopa  0.5 tablet Oral BID  . clonazePAM  0.25 mg Oral QHS  . docusate sodium  100 mg Oral BID  . enoxaparin  30 mg Subcutaneous Q12H  . nortriptyline  10 mg Oral TID  . nortriptyline  30 mg Oral QHS  . potassium chloride  40 mEq Oral Once  . QUEtiapine  12.5 mg Oral QHS  . Ropinirole HCl  1 tablet Oral QHS  . warfarin  5 mg Oral ONCE-1800   Infusions:    . sodium chloride 50 mL/hr at 06/25/11 1923  . DISCONTD: sodium chloride Stopped (06/25/11 1819)   PRN: acetaminophen, acetaminophen, haloperidol lactate, HYDROcodone-acetaminophen, iohexol, menthol-cetylpyridinium, methocarbamol(ROBAXIN) IV, methocarbamol, metoCLOPramide (REGLAN) injection, metoCLOPramide, ondansetron (ZOFRAN) IV, ondansetron, phenol, traMADol  Assessment: 77 yo F s/p R TKA. Hx of DVT x 2 and Hx A.fib noted. On coumadin for VTE prophylaxis.  INR continues to trend up toward goal. CBC stable On prophylactic-dose Lovenox 30 mg Q12h until INR > 2.   Goal of  Therapy:  INR 2-3   Plan:  1.) Repeat coumadin 5 mg po x 1 at 1800 2.) Continue lovenox 30 mg sq 12h until INR > 2 x 24 hr 3.) F/U AM PT/INR  Lejon Afzal, Loma Messing 06/26/2011,12:05 PM

## 2011-06-26 NOTE — Progress Notes (Addendum)
Angel Greene UVO:[id number]:[id number] is a 77 y.o. female,  Outpatient Primary MD for the patient is No primary provider on file.  No chief complaint on file.       Subjective:   [facility] today has, No headache, No chest pain, No abdominal pain - No Nausea, No new weakness tingling or numbness, No Cough - SOB.    Objective:   Filed Vitals:   06/25/11 1430 06/25/11 1720 06/26/11 0011 06/26/11 0556  BP: 101/61 103/65 127/69 109/70  Pulse: 84 88 97 101  Temp: 97.5 F (36.4 C) 98.6 F (37 C) 99.1 F (37.3 C) 97.4 F (36.3 C)  TempSrc: Oral Oral Oral Oral  Resp: 16 18 20 20   Height:      Weight:      SpO2: 93% 97% 100% 98%    Wt Readings from Last 3 Encounters:  06/24/11 89.812 kg (198 lb)  06/24/11 89.812 kg (198 lb)  06/21/11 89.812 kg (198 lb)     Intake/Output Summary (Last 24 hours) at 06/26/11 1104 Last data filed at 06/26/11 0900  Gross per 24 hour  Intake   1725 ml  Output   1775 ml  Net    -50 ml    Exam Awake, delirium improved, No new F.N deficits, Normal affect [place].AT,PERRAL Supple Neck,No JVD, No cervical lymphadenopathy appriciated.  Symmetrical Chest wall movement, Good air movement bilaterally, CTAB RRR,No Gallops,Rubs or new Murmurs, No Parasternal Heave +ve B.Sounds, Abd Soft, Non tender, No organomegaly appriciated, No rebound -guarding or rigidity. No Cyanosis, Clubbing or edema, No new Rash or bruise.  Data Review  CBC  Lab 06/26/11 0635 06/25/11 1720 06/24/11 0345 06/21/11 0950  WBC 8.8 8.6 9.2 5.5  HGB 10.7* 10.4* 12.3 15.0  HCT 32.3* 32.3* 37.4 45.3  PLT 168 150 204 250  MCV 93.6 93.4 93.5 92.4  MCH 31.0 30.1 30.8 30.6  MCHC 33.1 32.2 32.9 33.1  RDW 14.8 14.7 14.8 14.7  LYMPHSABS -- 1.6 -- 1.7  MONOABS -- 0.5 -- 0.4  EOSABS -- 0.5 -- 0.2  BASOSABS -- 0.0 -- 0.0  BANDABS -- -- -- --    Chemistries   Lab 06/26/11 0635 06/25/11 1720 06/24/11 0345  NA 136 135 137  K 3.4* 3.5 3.7  CL 99 99 100  CO2 31 33* 33*  GLUCOSE  102* 123* 136*  BUN 9 11 14   CREATININE 0.78 0.85 0.99  CALCIUM 8.5 8.2* 8.9  MG -- -- --  AST -- -- --  ALT -- -- --  ALKPHOS -- -- --  BILITOT -- -- --   ------------------------------------------------------------------------------------------------------------------ estimated creatinine clearance is 61.8 ml/min (by C-G formula based on Cr of 0.78). ------------------------------------------------------------------------------------------------------------------ No results found for this basename: HGBA1C:2 in the last 72 hours ------------------------------------------------------------------------------------------------------------------ No results found for this basename: CHOL:2,HDL:2,LDLCALC:2,TRIG:2,CHOLHDL:2,LDLDIRECT:2 in the last 72 hours ------------------------------------------------------------------------------------------------------------------  [facility] 06/25/11 1448  TSH 1.381  T4TOTAL --  T3FREE --  THYROIDAB --   ------------------------------------------------------------------------------------------------------------------ No results found for this basename: VITAMINB12:2,FOLATE:2,FERRITIN:2,TIBC:2,IRON:2,RETICCTPCT:2 in the last 72 hours  Coagulation profile  Lab 06/26/11 0635 06/25/11 1030 06/24/11 0345 06/21/11 0950  INR 1.81* 1.58* 1.08 0.94  PROTIME -- -- -- --     Basename 06/25/11 1720  DDIMER 3.21*    Cardiac Enzymes  Lab 06/26/11 0635 06/26/11 0101 06/25/11 1850  CKMB 4.1* 3.8 4.6*  TROPONINI <0.30 <0.30 <0.30  MYOGLOBIN -- -- --   ------------------------------------------------------------------------------------------------------------------ No components found with this basename: POCBNP:3  Micro Results Recent Results (from the past 240  hour(s))  SURGICAL PCR SCREEN     Status: Normal   Collection Time   06/21/11  9:46 AM      Component Value Range Status Comment   MRSA, PCR NEGATIVE  NEGATIVE  Final    Staphylococcus aureus  NEGATIVE  NEGATIVE  Final   URINE CULTURE     Status: Normal   Collection Time   06/23/11  1:45 PM      Component Value Range Status Comment   Specimen Description URINE, CATHETERIZED   Final    Special Requests NONE   Final    Setup Time [id number]   Final    Colony Count NO GROWTH   Final    Culture NO GROWTH   Final    Report Status 06/24/2011 FINAL   Final     Radiology Reports Dg Chest 2 View  06/21/2011  *RADIOLOGY REPORT*  Clinical Data: Preoperative respiratory films.  CHEST - 2 VIEW  Comparison: PA and lateral chest 04/02/2010.  Findings: Eventration of the left hemidiaphragm is unchanged. There is some scar in the left lung base.  Right lung is clear.  No pneumothorax or effusion.  Heart size is normal.  Surgical clips in the left axilla are noted.  IMPRESSION: No acute finding.  Original Report Authenticated By: Bernadene Bell. D'ALESSIO, M.D.    Scheduled Meds:    . carbidopa-levodopa  0.5 tablet Oral BID  . clonazePAM  0.25 mg Oral QHS  . docusate sodium  100 mg Oral BID  . enoxaparin  30 mg Subcutaneous Q12H  . nortriptyline  10 mg Oral TID  . nortriptyline  30 mg Oral QHS  . potassium chloride  40 mEq Oral Once  . QUEtiapine  12.5 mg Oral QHS  . Ropinirole HCl  1 tablet Oral QHS  . warfarin  5 mg Oral ONCE-1800  . DISCONTD: hydrochlorothiazide  25 mg Oral Daily   Continuous Infusions:    . sodium chloride 50 mL/hr at 06/25/11 1923  . DISCONTD: 0.45 % NaCl with KCl 20 mEq / L 100 mL/hr at 06/25/11 0603  . DISCONTD: sodium chloride Stopped (06/25/11 1819)   PRN Meds:.acetaminophen, acetaminophen, haloperidol lactate, HYDROcodone-acetaminophen, iohexol, menthol-cetylpyridinium, methocarbamol(ROBAXIN) IV, methocarbamol, metoCLOPramide (REGLAN) injection, metoCLOPramide, ondansetron (ZOFRAN) IV, ondansetron, phenol, traMADol, DISCONTD: diphenhydrAMINE, DISCONTD: diphenhydrAMINE, DISCONTD: diphenhydrAMINE, DISCONTD: ondansetron (ZOFRAN) IV  Assessment & Plan    Principal Problem:  1. Rt Knee surgery for  Advanced OA (osteoarthritis) of knee - per dr Despina Hick. Increase activity-PT OT per Ortho.  2. Tachycardia and hypotension 06-24-11 - H/O Afib - likely S tach due to deconditioning and Delirium- being seen by her Cardiologist Dr Bernell List- CT stable no PE, EKG and tele stable, BP better. Added IS, Management per Cardio.  3. Delirium - some at home too per husband, Psych consulted, PRN Haldol, with Qtc monitor. Better.  4. Parkinson disease - Home meds and outpt Neuro follow.  5.Low K - replaced, check in am.   DVT Prophylaxis  Lovenox - Coumadin - pharm following. Must follow for PCP-cardio for longterm anticoagulation question.    Medicine will signoff please call with any questions    Leroy Sea M.D on 06/26/2011,at 11:04 AM  [facility] Office  [phone]

## 2011-06-26 NOTE — Progress Notes (Signed)
Per RN, Pt not ready for D/c over weekend.  Notified facility.  Providence Crosby, LCSWA Clinical Social Work [phone]

## 2011-06-26 NOTE — Progress Notes (Signed)
Physical Therapy Treatment Patient Details Name: Angel Greene MRN: [id number] DOB: 05/10/1930 Today's Date: 06/26/2011 [id number] 1TE, 1TA, 1GT  PT Assessment/Plan  PT - Assessment/Plan Comments on Treatment Session: Patient progressing slowly, but still very appropriate for d/c to SNF on Monday.  Will need increased time due to h/o Parkinsons. PT Plan: Discharge plan remains appropriate PT Frequency: 7X/week Follow Up Recommendations: Skilled nursing facility Equipment Recommended: Defer to next venue PT Goals  Acute Rehab PT Goals PT Goal: Supine/Side to Sit - Progress: Progressing toward goal PT Goal: Sit to Stand - Progress: Progressing toward goal PT Goal: Ambulate - Progress: Progressing toward goal PT Goal: Perform Home Exercise Program - Progress: Progressing toward goal  PT Treatment Precautions/Restrictions  Precautions Precautions: Knee Required Braces or Orthoses: Yes Knee Immobilizer: On except when in CPM;On when out of bed or walking;Discontinue once straight leg raise with < 10 degree lag Restrictions Weight Bearing Restrictions: Yes RLE Weight Bearing: Weight bearing as tolerated Mobility (including Balance) Bed Mobility Supine to Sit: 3: Mod assist;With rails Supine to Sit Details (indicate cue type and reason): cues for technique Sitting - Scoot to Edge of Bed: 3: Mod assist Sitting - Scoot to Edge of Bed Details (indicate cue type and reason): with cues for positioning at EOB Transfers Sit to Stand: 2: Max assist;From bed;With upper extremity assist Sit to Stand Details (indicate cue type and reason): cues for technique, increased time to come upright once on her feet and multiple cues for posture/balance Stand to Sit: To chair/3-in-1;With upper extremity assist;3: Mod assist Stand to Sit Details: cues for hand placement and right LE out to sit Ambulation/Gait Ambulation/Gait Assistance: 3: Mod assist;4: Min assist Ambulation/Gait Assistance Details  (indicate cue type and reason): improved to min at times with cues for step length, use of UE's for decreased weight on right as needed Ambulation Distance (Feet): 35 Feet Assistive device: Rolling walker Gait Pattern: Step-to pattern;Decreased stride length Gait velocity: very slow  Posture/Postural Control Posture/Postural Control: Postural limitations Postural Limitations: forward head and rounded shoulders with trunk flexed Exercise  Total Joint Exercises Ankle Circles/Pumps: AROM;Both;10 reps;Supine Quad Sets: AROM;Right;10 reps;Supine Short Arc Quad: AROM;Right;10 reps;Supine Heel Slides: AAROM;Right;10 reps;Supine Hip ABduction/ADduction: AAROM;Right;10 reps;Supine Straight Leg Raises: AAROM;Right;10 reps;Supine End of Session PT - End of Session Equipment Utilized During Treatment: Gait belt;Right knee immobilizer Activity Tolerance: Patient tolerated treatment well Patient left: in chair;with family/visitor present;with call bell in reach General Behavior During Session: [facility] for tasks performed Cognition: [facility] for tasks performed  [facility] 06/26/2011, 2:15 PM

## 2011-06-26 NOTE — Progress Notes (Signed)
Subjective:  She had a much better night last night with less agitation. She is alert and oriented this morning and says that she feels well. No shortness of breath. She had a CT scan last night done because of an elevated d-dimer the did not show any significant pulmonary emboli. She does not have any significant chest pain. Cardiac enzymes have been negative.  Objective:  Vital Signs in the last 24 hours: BP 109/70  Pulse 101  Temp(Src) 97.4 F (36.3 C) (Oral)  Resp 20  Ht 5\' 4"  (1.626 m)  Wt 89.812 kg (198 lb)  BMI 33.99 kg/m2  SpO2 98%  Physical Exam:  Lungs:  Clear Cardiac:  Regular rhythm, normal S1 and S2, no S3 Abdomen:  Soft, nontender, no masses Extremities:  No edema present  Intake/Output from previous day: 12/21 0701 - 12/22 0700 In: 1605 [P.O.:360; I.V.:1245] Out: 1075 [Urine:1075]  Lab Results: Basic Metabolic Panel:  [facility] 06/26/11 0635 06/25/11 1720  NA 136 135  K 3.4* 3.5  CL 99 99  CO2 31 33*  GLUCOSE 102* 123*  BUN 9 11  CREATININE 0.78 0.85   CBC:  Basename 06/26/11 0635 06/25/11 1720  WBC 8.8 8.6  NEUTROABS -- 5.9  HGB 10.7* 10.4*  HCT 32.3* 32.3*  MCV 93.6 93.4  PLT 168 150   Cardiac Enzymes:  Basename 06/26/11 0635 06/26/11 0101 06/25/11 1850  CKTOTAL 102 116 148  CKMB 4.1* 3.8 4.6*  CKMBINDEX -- -- --  TROPONINI <0.30 <0.30 <0.30   Lab Results  Component Value Date   INR 1.81* 06/26/2011   INR 1.58* 06/25/2011   INR 1.08 06/24/2011   Telemetry: Reviewed : Remains in normal sinus rhythm overnight  Assessment/Plan:  1. Tachycardia has improved and is likely multifactorial 2. Mild cardiomyopathy which is clinically stable 3. Significant Parkinson syndrome  Recommendations:  Her tachycardia has improved and she has no evidence of DVT or pulmonary emboli. Her eye Doris gradually drifting up and would continue warfarin as she recovers from her knee surgery. I would anticipate her being able to go to rehabilitation on  Monday if she remains stable over the weekend.  Darden Palmer.  MD [facility] 06/26/2011, 9:20 AM

## 2011-06-27 LAB — PROTIME-INR: Prothrombin Time: 20.7 seconds — ABNORMAL HIGH (ref 11.6–15.2)

## 2011-06-27 LAB — BASIC METABOLIC PANEL
BUN: 10 mg/dL (ref 6–23)
Chloride: 98 mEq/L (ref 96–112)
Creatinine, Ser: 0.76 mg/dL (ref 0.50–1.10)
GFR calc Af Amer: >90 mL/min (ref >90)
GFR calc non Af Amer: 78 mL/min — ABNORMAL LOW (ref >90)

## 2011-06-27 MED ORDER — WARFARIN SODIUM 7.5 MG PO TABS
7.5000 mg | ORAL_TABLET | Freq: Once | ORAL | Status: AC
  Administered 2011-06-27: 7.5 mg via ORAL
  Filled 2011-06-27: qty 1

## 2011-06-27 NOTE — Progress Notes (Signed)
ANTICOAGULATION CONSULT NOTE - Follow Up Consult  Pharmacy Consult for Coumadin Indication: VTE prophylaxis s/p TKA  No Known Allergies  Patient Measurements: Height: 5\' 4"  (162.6 cm) Weight: 198 lb (89.812 kg) IBW/kg (Calculated) : 54.7    Vital Signs: Temp: 97.6 F (36.4 C) (12/23 0624) Temp src: Oral (12/23 0624) BP: 123/77 mmHg (12/23 0624) Pulse Rate: 86  (12/23 0624)  Labs:  Basename 06/27/11 0700 06/27/11 0645 06/26/11 0635 06/26/11 0101 06/25/11 1850 06/25/11 1720 06/25/11 1030  HGB -- -- 10.7* -- -- 10.4* --  HCT -- -- 32.3* -- -- 32.3* --  PLT -- -- 168 -- -- 150 --  APTT -- -- -- -- -- -- --  LABPROT -- 20.7* 21.3* -- -- -- 19.2*  INR -- 1.74* 1.81* -- -- -- 1.58*  HEPARINUNFRC -- -- -- -- -- -- --  CREATININE 0.76 -- 0.78 -- -- 0.85 --  CKTOTAL -- -- 102 116 148 -- --  CKMB -- -- 4.1* 3.8 4.6* -- --  TROPONINI -- -- <0.30 <0.30 <0.30 -- --   Estimated Creatinine Clearance: 61.8 ml/min (by C-G formula based on Cr of 0.76).   Medications:  Scheduled:    . carbidopa-levodopa  0.5 tablet Oral BID  . clonazePAM  0.25 mg Oral QHS  . docusate sodium  100 mg Oral BID  . enoxaparin  30 mg Subcutaneous Q12H  . nortriptyline  10 mg Oral TID  . nortriptyline  30 mg Oral QHS  . potassium chloride  40 mEq Oral Once  . QUEtiapine  12.5 mg Oral QHS  . Ropinirole HCl  1 tablet Oral QHS  . warfarin  5 mg Oral ONCE-1800  . warfarin  7.5 mg Oral ONCE-1800   Infusions:   PRN: acetaminophen, acetaminophen, haloperidol lactate, HYDROcodone-acetaminophen, menthol-cetylpyridinium, methocarbamol(ROBAXIN) IV, methocarbamol, metoCLOPramide (REGLAN) injection, metoCLOPramide, ondansetron (ZOFRAN) IV, ondansetron, phenol, traMADol  Assessment: 77 yo F s/p R TKA. Hx of DVT x 2 and Hx A.fib noted. On coumadin for VTE prophylaxis. INR had been trending up nicely toward goal but has decreased slightly today.  Pt. Reminds on lovenox 30 mg BID while INR < 2.   Goal of Therapy:    INR 2-3  Plan:   Coumadin 7.5 mg po x 1 tonight, f/u AM INR.  Continue lovenox 30 mg BID until INR > 2 (Hx DVT's)  Shantae Vantol, Loma Messing PharmD 10:48 AM 06/27/2011

## 2011-06-27 NOTE — Progress Notes (Signed)
Coumadin education done by PharmD.  Glady Ouderkirk, Loma Messing PharmD 3:16 PM 06/27/2011

## 2011-06-27 NOTE — Progress Notes (Signed)
Physical Therapy Treatment Patient Details Name: Angel Greene MRN: [id number] DOB: 1930/04/07 Today's Date: 06/27/2011  PT Assessment/Plan  PT - Assessment/Plan Comments on Treatment Session: Slowly progressing. Needs to use supplemental O2 for gait training PT Plan: Discharge plan remains appropriate PT Frequency: 7X/week Follow Up Recommendations: Skilled nursing facility Equipment Recommended: Defer to next venue PT Goals  Acute Rehab PT Goals PT Goal: Supine/Side to Sit - Progress: Progressing toward goal PT Goal: Sit to Stand - Progress: Progressing toward goal PT Goal: Ambulate - Progress: Progressing toward goal PT Goal: Perform Home Exercise Program - Progress: Progressing toward goal  PT Treatment Precautions/Restrictions  Precautions Precautions: Knee Precaution Comments: pt with confusion evident today with some diffuculty processing commands Required Braces or Orthoses: Yes Knee Immobilizer:  (pt able to straight leg raise) Restrictions Weight Bearing Restrictions: Yes RLE Weight Bearing: Weight bearing as tolerated Mobility (including Balance) Bed Mobility Supine to Sit: 2: Max assist Supine to Sit Details (indicate cue type and reason): pt had difficulty turning over in bed and maneuvering on hospital bed  Sitting - Scoot to Edge of Bed: 3: Mod assist Transfers Transfers: Yes Sit to Stand: 2: Max assist;With upper extremity assist Sit to Stand Details (indicate cue type and reason): pt had difficulty pushing up with arms, needed to have bed height raised and needed extra support to lift hips  Stand to Sit: 3: Mod assist Stand to Sit Details: needs help to control descent Stand Pivot Transfers: 3: Mod assist Stand Pivot Transfer Details (indicate cue type and reason): pt had diffuculty standing from bed, achieving balance and shifting weight to pivot to chair. ???? appeared to have problems with motor plan???due to ?? Ambulation/Gait Ambulation/Gait:  Yes Ambulation/Gait Assistance: 3: Mod assist Ambulation/Gait Assistance Details (indicate cue type and reason): cues to weight shift and take bigger steps.  constant cues to stand erect.  Appeared to have some leaning toward left Ambulation Distance (Feet): 50 Feet (desaturation to 80% with ambulation) Assistive device: Rolling walker Gait Pattern: Step-to pattern;Decreased step length - right;Decreased step length - left;Lateral trunk lean to right Gait velocity: slow, needs encouragement  Posture/Postural Control Posture/Postural Control: Postural limitations Postural Limitations: diffuculty finding and maintaining midline, continues with forward head and rounded shoulders Exercise  Total Joint Exercises Ankle Circles/Pumps: Both;10 reps;Supine;AROM Quad Sets: AROM;Strengthening;Both;Supine Short Arc Quad: AROM;Right;10 reps Heel Slides: AAROM;Right;10 reps;Supine Straight Leg Raises: AROM;Right;Other reps (comment) (3 reps prior to fatigue) End of Session PT - End of Session Equipment Utilized During Treatment: Gait belt Activity Tolerance: Patient tolerated treatment well (pt improved in participation during course of treatment) General Behavior During Session: [facility] for tasks performed  Donnetta Hail 06/27/2011, 1:43 PM

## 2011-06-27 NOTE — Progress Notes (Signed)
Angel Greene WUJ:[id number]:[id number] is a 77 y.o. female,  Outpatient Primary MD for the patient is No primary provider on file.  No chief complaint on file.       Subjective:   [facility] today has, No headache, No chest pain, No abdominal pain - No Nausea, No new weakness tingling or numbness, No Cough - SOB.    Objective:   Filed Vitals:   06/26/11 1125 06/26/11 1507 06/26/11 2225 06/27/11 0624  BP:  107/69 128/71 123/77  Pulse: 95 92 106 86  Temp:  98.2 F (36.8 C) 99.8 F (37.7 C) 97.6 F (36.4 C)  TempSrc:  Oral Oral Oral  Resp:  20 18 18   Height:      Weight:      SpO2: 87% 98% 96% 99%    Wt Readings from Last 3 Encounters:  06/24/11 89.812 kg (198 lb)  06/24/11 89.812 kg (198 lb)  06/21/11 89.812 kg (198 lb)     Intake/Output Summary (Last 24 hours) at 06/27/11 1027 Last data filed at 06/27/11 4696  Gross per 24 hour  Intake    540 ml  Output    700 ml  Net   -160 ml    Exam Awake, delirium improved, No new F.N deficits, Normal affect [place].AT,PERRAL Supple Neck,No JVD, No cervical lymphadenopathy appriciated.  Symmetrical Chest wall movement, Good air movement bilaterally, CTAB RRR,No Gallops,Rubs or new Murmurs, No Parasternal Heave +ve B.Sounds, Abd Soft, Non tender, No organomegaly appriciated, No rebound -guarding or rigidity. No Cyanosis, Clubbing or edema, No new Rash or bruise.  Data Review  CBC  Lab 06/26/11 0635 06/25/11 1720 06/24/11 0345 06/21/11 0950  WBC 8.8 8.6 9.2 5.5  HGB 10.7* 10.4* 12.3 15.0  HCT 32.3* 32.3* 37.4 45.3  PLT 168 150 204 250  MCV 93.6 93.4 93.5 92.4  MCH 31.0 30.1 30.8 30.6  MCHC 33.1 32.2 32.9 33.1  RDW 14.8 14.7 14.8 14.7  LYMPHSABS -- 1.6 -- 1.7  MONOABS -- 0.5 -- 0.4  EOSABS -- 0.5 -- 0.2  BASOSABS -- 0.0 -- 0.0  BANDABS -- -- -- --    Chemistries   Lab 06/27/11 0700 06/26/11 0635 06/25/11 1720 06/24/11 0345  NA 136 136 135 137  K 4.0 3.4* 3.5 3.7  CL 98 99 99 100  CO2 33* 31 33* 33*  GLUCOSE 97  102* 123* 136*  BUN 10 9 11 14   CREATININE 0.76 0.78 0.85 0.99  CALCIUM 8.7 8.5 8.2* 8.9  MG -- -- -- --  AST -- -- -- --  ALT -- -- -- --  ALKPHOS -- -- -- --  BILITOT -- -- -- --   ------------------------------------------------------------------------------------------------------------------ estimated creatinine clearance is 61.8 ml/min (by C-G formula based on Cr of 0.76). ------------------------------------------------------------------------------------------------------------------ No results found for this basename: HGBA1C:2 in the last 72 hours ------------------------------------------------------------------------------------------------------------------ No results found for this basename: CHOL:2,HDL:2,LDLCALC:2,TRIG:2,CHOLHDL:2,LDLDIRECT:2 in the last 72 hours ------------------------------------------------------------------------------------------------------------------  [facility] 06/25/11 1448  TSH 1.381  T4TOTAL --  T3FREE --  THYROIDAB --   ------------------------------------------------------------------------------------------------------------------ No results found for this basename: VITAMINB12:2,FOLATE:2,FERRITIN:2,TIBC:2,IRON:2,RETICCTPCT:2 in the last 72 hours  Coagulation profile  Lab 06/27/11 0645 06/26/11 0635 06/25/11 1030 06/24/11 0345 06/21/11 0950  INR 1.74* 1.81* 1.58* 1.08 0.94  PROTIME -- -- -- -- --     Basename 06/25/11 1720  DDIMER 3.21*    Cardiac Enzymes  Lab 06/26/11 0635 06/26/11 0101 06/25/11 1850  CKMB 4.1* 3.8 4.6*  TROPONINI <0.30 <0.30 <0.30  MYOGLOBIN -- -- --   ------------------------------------------------------------------------------------------------------------------  No components found with this basename: POCBNP:3  Micro Results Recent Results (from the past 240 hour(s))  SURGICAL PCR SCREEN     Status: Normal   Collection Time   06/21/11  9:46 AM      Component Value Range Status Comment   MRSA, PCR  NEGATIVE  NEGATIVE  Final    Staphylococcus aureus NEGATIVE  NEGATIVE  Final   URINE CULTURE     Status: Normal   Collection Time   06/23/11  1:45 PM      Component Value Range Status Comment   Specimen Description URINE, CATHETERIZED   Final    Special Requests NONE   Final    Setup Time [id number]   Final    Colony Count NO GROWTH   Final    Culture NO GROWTH   Final    Report Status 06/24/2011 FINAL   Final     Radiology Reports Dg Chest 2 View  06/21/2011  *RADIOLOGY REPORT*  Clinical Data: Preoperative respiratory films.  CHEST - 2 VIEW  Comparison: PA and lateral chest 04/02/2010.  Findings: Eventration of the left hemidiaphragm is unchanged. There is some scar in the left lung base.  Right lung is clear.  No pneumothorax or effusion.  Heart size is normal.  Surgical clips in the left axilla are noted.  IMPRESSION: No acute finding.  Original Report Authenticated By: Bernadene Bell. D'ALESSIO, M.D.    Scheduled Meds:    . carbidopa-levodopa  0.5 tablet Oral BID  . clonazePAM  0.25 mg Oral QHS  . docusate sodium  100 mg Oral BID  . enoxaparin  30 mg Subcutaneous Q12H  . nortriptyline  10 mg Oral TID  . nortriptyline  30 mg Oral QHS  . potassium chloride  40 mEq Oral Once  . QUEtiapine  12.5 mg Oral QHS  . Ropinirole HCl  1 tablet Oral QHS  . warfarin  5 mg Oral ONCE-1800   Continuous Infusions:   PRN Meds:.acetaminophen, acetaminophen, haloperidol lactate, HYDROcodone-acetaminophen, menthol-cetylpyridinium, methocarbamol(ROBAXIN) IV, methocarbamol, metoCLOPramide (REGLAN) injection, metoCLOPramide, ondansetron (ZOFRAN) IV, ondansetron, phenol, traMADol  Assessment & Plan   Principal Problem:  1. Rt Knee surgery for  Advanced OA (osteoarthritis) of knee - per dr Despina Hick. Increase activity-PT OT per Ortho.  2. Tachycardia and hypotension 06-24-11 - H/O Afib - was S tach due to deconditioning, Atelectasis and Delirium- being seen by her Cardiologist Dr Bernell List- CT stable no PE,  EKG and tele stable, BP better. Added IS, Management per Cardio. Rate much better, encouraged to sit in chair and advance activity.  3. Delirium - some at home too per husband, Psych consulted, PRN Haldol, with Qtc monitor. Better.  4. Parkinson disease - Home meds and outpt Neuro follow.  5.Low K - replaced, resolved.   DVT Prophylaxis  Lovenox - Coumadin - pharm following. Must follow for PCP-cardio for longterm anticoagulation question.    Medicine will signoff please call with any questions - 06-27-11    Leroy Sea M.D on 06/27/2011,at 10:27 AM  [facility] Office  [phone]

## 2011-06-27 NOTE — Progress Notes (Signed)
Subjective: 4 Days Post-Op Procedure(s) (LRB): TOTAL KNEE ARTHROPLASTY (Right) Patient reports pain as mild.   No complaints Objective: Vital signs in last 24 hours: Temp:  [97.6 F (36.4 C)-99.8 F (37.7 C)] 97.6 F (36.4 C) (12/23 0624) Pulse Rate:  [86-106] 86  (12/23 0624) Resp:  [18-20] 18  (12/23 0624) BP: (107-128)/(69-77) 123/77 mmHg (12/23 0624) SpO2:  [87 %-99 %] 99 % (12/23 0624)  Intake/Output from previous day: 12/22 0701 - 12/23 0700 In: 660 [P.O.:660] Out: 1400 [Urine:1400] Intake/Output this shift:     Basename 06/26/11 0635 06/25/11 1720  HGB 10.7* 10.4*    Basename 06/26/11 0635 06/25/11 1720  WBC 8.8 8.6  RBC 3.45* 3.46*  HCT 32.3* 32.3*  PLT 168 150    Basename 06/27/11 0700 06/26/11 0635  NA 136 136  K 4.0 3.4*  CL 98 99  CO2 33* 31  BUN 10 9  CREATININE 0.76 0.78  GLUCOSE 97 102*  CALCIUM 8.7 8.5    Basename 06/27/11 0645 06/26/11 0635  LABPT -- --  INR 1.74* 1.81*    ABD soft Neurovascular intact Incision: dressing C/D/I and wound benign Assessment/Plan: 4 Days Post-Op Procedure(s) (LRB): TOTAL KNEE ARTHROPLASTY (Right) Probable d/c to SNF in AM Check labs in AM  CLARK,GILBERT W. 06/27/2011, 10:42 AM

## 2011-06-27 NOTE — Progress Notes (Addendum)
Subjective:  She is feeling good and is less agitated. She complains of a mild amount of nasal congestion. She does not have any significant shortness of breath or chest discomfort. Her rehabilitation is coming along reasonably well.  Objective:  Vital Signs in the last 24 hours: BP 123/77  Pulse 86  Temp(Src) 97.6 F (36.4 C) (Oral)  Resp 18  Ht 5\' 4"  (1.626 m)  Wt 89.812 kg (198 lb)  BMI 33.99 kg/m2  SpO2 99%  Physical Exam:  Lungs:  Clear Cardiac:  Regular rhythm, normal S1 and S2, no S3 Abdomen:  Soft, nontender, no masses Extremities:  No edema present  Intake/Output from previous day: 12/22 0701 - 12/23 0700 In: 660 [P.O.:660] Out: 1400 [Urine:1400]  Lab Results: Basic Metabolic Panel:  [facility] 06/26/11 0635 06/25/11 1720  NA 136 135  K 3.4* 3.5  CL 99 99  CO2 31 33*  GLUCOSE 102* 123*  BUN 9 11  CREATININE 0.78 0.85   CBC:  Basename 06/26/11 0635 06/25/11 1720  WBC 8.8 8.6  NEUTROABS -- 5.9  HGB 10.7* 10.4*  HCT 32.3* 32.3*  MCV 93.6 93.4  PLT 168 150   Cardiac Enzymes:  Basename 06/26/11 0635 06/26/11 0101 06/25/11 1850  CKTOTAL 102 116 148  CKMB 4.1* 3.8 4.6*  CKMBINDEX -- -- --  TROPONINI <0.30 <0.30 <0.30   Lab Results  Component Value Date   INR 1.74* 06/27/2011   INR 1.81* 06/26/2011   INR 1.58* 06/25/2011   Telemetry: Reviewed : Remains in normal sinus rhythm overnight  Assessment/Plan:  1. Tachycardia has improved and is likely multifactorial 2. Mild cardiomyopathy which is clinically stable 3. Significant Parkinson syndrome  Recommendations:  Her INR is still subtherapeutic and she will need to have additional warfarin today. From a cardiac viewpoint I think she'll be fine to go to the rehabilitation or nursing home in the morning. Check potassium.  Darden Palmer.  MD [facility] 06/27/2011, 9:08 AM

## 2011-06-28 MED ORDER — WARFARIN SODIUM 10 MG PO TABS
10.0000 mg | ORAL_TABLET | Freq: Once | ORAL | Status: DC
  Filled 2011-06-28: qty 1

## 2011-06-28 MED ORDER — POTASSIUM CHLORIDE 20 MEQ/15ML (10%) PO LIQD
40.0000 meq | Freq: Once | ORAL | Status: DC
  Filled 2011-06-28: qty 30

## 2011-06-28 NOTE — Progress Notes (Signed)
CSW completed discharge documentation and arranged for transportation to [facility].  No further Social Work needs.  Beverly Sessions MSW, LCSW [phone]

## 2011-06-28 NOTE — Discharge Summary (Signed)
Physician Discharge Summary  Patient ID: Angel Greene MRN: [id number] DOB/AGE: 75-Feb-1931 75 y.o.  Admit date: 06/23/2011 Discharge date: 06/28/2011  Admission Diagnoses:  Principal Problem:  *Tachycardia Active Problems:  OA (osteoarthritis) of knee  Atrial fibrillation  Delirium  Parkinson disease  Hypertensive heart disease without CHF   Discharge Diagnoses:  Same   Surgeries: Procedure(s): TOTAL KNEE ARTHROPLASTY on 06/23/2011   Consultants: PT/OT cardiology  Discharged Condition: Stable  Hospital Course: Angel Greene is an 77 y.o. female who was admitted 06/23/2011 with a chief complaint of No chief complaint on file. , and found to have a diagnosis of Tachycardia.  They were brought to the operating room on 06/23/2011 and underwent the above named procedures.    The patient had an uncomplicated hospital course and was stable for discharge.  Recent vital signs:  Filed Vitals:   06/28/11 0600  BP: 105/70  Pulse: 86  Temp: 97.6 F (36.4 C)  Resp: 20    Recent laboratory studies:  Results for orders placed during the hospital encounter of 06/23/11  URINE CULTURE      Component Value Range   Specimen Description URINE, CATHETERIZED     Special Requests NONE     Setup Time [id number]     Colony Count NO GROWTH     Culture NO GROWTH     Report Status 06/24/2011 FINAL    CBC      Component Value Range   WBC 9.2  4.0 - 10.5 (K/uL)   RBC 4.00  3.87 - 5.11 (MIL/uL)   Hemoglobin 12.3  12.0 - 15.0 (g/dL)   HCT 01.0  27.2 - 53.6 (%)   MCV 93.5  78.0 - 100.0 (fL)   MCH 30.8  26.0 - 34.0 (pg)   MCHC 32.9  30.0 - 36.0 (g/dL)   RDW 64.4  03.4 - 74.2 (%)   Platelets 204  150 - 400 (K/uL)  BASIC METABOLIC PANEL      Component Value Range   Sodium 137  135 - 145 (mEq/L)   Potassium 3.7  3.5 - 5.1 (mEq/L)   Chloride 100  96 - 112 (mEq/L)   CO2 33 (*) 19 - 32 (mEq/L)   Glucose, Bld 136 (*) 70 - 99 (mg/dL)   BUN 14  6 - 23 (mg/dL)   Creatinine, Ser 5.95   0.50 - 1.10 (mg/dL)   Calcium 8.9  8.4 - 63.8 (mg/dL)   GFR calc non Af Amer 53 (*) >90 (mL/min)   GFR calc Af Amer 61 (*) >90 (mL/min)  PROTIME-INR      Component Value Range   Prothrombin Time 14.2  11.6 - 15.2 (seconds)   INR 1.08  0.00 - 1.49   PROTIME-INR      Component Value Range   Prothrombin Time 19.2 (*) 11.6 - 15.2 (seconds)   INR 1.58 (*) 0.00 - 1.49   TSH      Component Value Range   TSH 1.381  0.350 - 4.500 (uIU/mL)  DIGOXIN LEVEL      Component Value Range   Digoxin Level 0.8  0.8 - 2.0 (ng/mL)  D-DIMER, QUANTITATIVE      Component Value Range   D-Dimer, Quant 3.21 (*) 0.00 - 0.48 (ug/mL-FEU)  CBC      Component Value Range   WBC 8.8  4.0 - 10.5 (K/uL)   RBC 3.45 (*) 3.87 - 5.11 (MIL/uL)   Hemoglobin 10.7 (*) 12.0 - 15.0 (g/dL)   HCT 75.6 (*) 43.3 -  46.0 (%)   MCV 93.6  78.0 - 100.0 (fL)   MCH 31.0  26.0 - 34.0 (pg)   MCHC 33.1  30.0 - 36.0 (g/dL)   RDW 52.8  41.3 - 24.4 (%)   Platelets 168  150 - 400 (K/uL)  PROTIME-INR      Component Value Range   Prothrombin Time 21.3 (*) 11.6 - 15.2 (seconds)   INR 1.81 (*) 0.00 - 1.49   BASIC METABOLIC PANEL      Component Value Range   Sodium 136  135 - 145 (mEq/L)   Potassium 3.4 (*) 3.5 - 5.1 (mEq/L)   Chloride 99  96 - 112 (mEq/L)   CO2 31  19 - 32 (mEq/L)   Glucose, Bld 102 (*) 70 - 99 (mg/dL)   BUN 9  6 - 23 (mg/dL)   Creatinine, Ser 0.10  0.50 - 1.10 (mg/dL)   Calcium 8.5  8.4 - 27.2 (mg/dL)   GFR calc non Af Amer 77 (*) >90 (mL/min)   GFR calc Af Amer 90 (*) >90 (mL/min)  CBC      Component Value Range   WBC 8.6  4.0 - 10.5 (K/uL)   RBC 3.46 (*) 3.87 - 5.11 (MIL/uL)   Hemoglobin 10.4 (*) 12.0 - 15.0 (g/dL)   HCT 53.6 (*) 64.4 - 46.0 (%)   MCV 93.4  78.0 - 100.0 (fL)   MCH 30.1  26.0 - 34.0 (pg)   MCHC 32.2  30.0 - 36.0 (g/dL)   RDW 03.4  74.2 - 59.5 (%)   Platelets 150  150 - 400 (K/uL)  DIFFERENTIAL      Component Value Range   Neutrophils Relative 69  43 - 77 (%)   Neutro Abs 5.9  1.7 - 7.7  (K/uL)   Lymphocytes Relative 19  12 - 46 (%)   Lymphs Abs 1.6  0.7 - 4.0 (K/uL)   Monocytes Relative 6  3 - 12 (%)   Monocytes Absolute 0.5  0.1 - 1.0 (K/uL)   Eosinophils Relative 6 (*) 0 - 5 (%)   Eosinophils Absolute 0.5  0.0 - 0.7 (K/uL)   Basophils Relative 0  0 - 1 (%)   Basophils Absolute 0.0  0.0 - 0.1 (K/uL)  BASIC METABOLIC PANEL      Component Value Range   Sodium 135  135 - 145 (mEq/L)   Potassium 3.5  3.5 - 5.1 (mEq/L)   Chloride 99  96 - 112 (mEq/L)   CO2 33 (*) 19 - 32 (mEq/L)   Glucose, Bld 123 (*) 70 - 99 (mg/dL)   BUN 11  6 - 23 (mg/dL)   Creatinine, Ser 6.38  0.50 - 1.10 (mg/dL)   Calcium 8.2 (*) 8.4 - 10.5 (mg/dL)   GFR calc non Af Amer 63 (*) >90 (mL/min)   GFR calc Af Amer 74 (*) >90 (mL/min)  CARDIAC PANEL(CRET KIN+CKTOT+MB+TROPI)      Component Value Range   Total CK 148  7 - 177 (U/L)   CK, MB 4.6 (*) 0.3 - 4.0 (ng/mL)   Troponin I <0.30  <0.30 (ng/mL)   Relative Index 3.1 (*) 0.0 - 2.5   CARDIAC PANEL(CRET KIN+CKTOT+MB+TROPI)      Component Value Range   Total CK 116  7 - 177 (U/L)   CK, MB 3.8  0.3 - 4.0 (ng/mL)   Troponin I <0.30  <0.30 (ng/mL)   Relative Index 3.3 (*) 0.0 - 2.5   CARDIAC PANEL(CRET KIN+CKTOT+MB+TROPI)  Component Value Range   Total CK 102  7 - 177 (U/L)   CK, MB 4.1 (*) 0.3 - 4.0 (ng/mL)   Troponin I <0.30  <0.30 (ng/mL)   Relative Index 4.0 (*) 0.0 - 2.5   PROTIME-INR      Component Value Range   Prothrombin Time 20.7 (*) 11.6 - 15.2 (seconds)   INR 1.74 (*) 0.00 - 1.49   BASIC METABOLIC PANEL      Component Value Range   Sodium 136  135 - 145 (mEq/L)   Potassium 4.0  3.5 - 5.1 (mEq/L)   Chloride 98  96 - 112 (mEq/L)   CO2 33 (*) 19 - 32 (mEq/L)   Glucose, Bld 97  70 - 99 (mg/dL)   BUN 10  6 - 23 (mg/dL)   Creatinine, Ser 1.19  0.50 - 1.10 (mg/dL)   Calcium 8.7  8.4 - 14.7 (mg/dL)   GFR calc non Af Amer 78 (*) >90 (mL/min)   GFR calc Af Amer >90  >90 (mL/min)  PROTIME-INR      Component Value Range    Prothrombin Time 20.9 (*) 11.6 - 15.2 (seconds)   INR 1.77 (*) 0.00 - 1.49     Discharge Medications:   Current Discharge Medication List    START taking these medications   Details  docusate sodium 100 MG CAPS Take 100 mg by mouth 2 (two) times daily. Qty: 10 capsule, Refills: 0    enoxaparin (LOVENOX) 30 MG/0.3ML SOLN Inject 0.3 mLs (30 mg total) into the skin every 12 (twelve) hours. Qty: 8.4 mL, Refills: 0    methocarbamol (ROBAXIN) 500 MG tablet Take 1 tablet (500 mg total) by mouth every 6 (six) hours as needed. Qty: 80 tablet, Refills: 0    ondansetron (ZOFRAN) 4 MG tablet Take 1 tablet (4 mg total) by mouth every 6 (six) hours as needed for nausea. Qty: 30 tablet, Refills: 0      CONTINUE these medications which have NOT CHANGED   Details  carbidopa-levodopa (SINEMET) 25-100 MG per tablet Take 0.5 tablets by mouth 2 (two) times daily.     clonazePAM (KLONOPIN) 0.5 MG tablet Take 0.25 mg by mouth at bedtime.     hydrochlorothiazide (HYDRODIURIL) 25 MG tablet Take 25 mg by mouth daily.      nortriptyline (PAMELOR) 10 MG capsule Take 30 mg by mouth at bedtime.     QUEtiapine (SEROQUEL) 25 MG tablet Take 12.5 mg by mouth at bedtime.     Ropinirole HCl (REQUIP XL) 12 MG TB24 Take 1 tablet by mouth at bedtime.     traMADol (ULTRAM) 50 MG tablet Take 50 mg by mouth every 6 (six) hours as needed. Maximum dose= 8 tablets per day. PAIN       STOP taking these medications     acetaminophen (TYLENOL) 500 MG tablet      aspirin EC 81 MG tablet      Calcium-Vitamin D (CALTRATE 600 PLUS-VIT D PO)      fish oil-omega-3 fatty acids 1000 MG capsule      meloxicam (MOBIC) 15 MG tablet         Diagnostic Studies: Dg Chest 2 View  06/21/2011  *RADIOLOGY REPORT*  Clinical Data: Preoperative respiratory films.  CHEST - 2 VIEW  Comparison: PA and lateral chest 04/02/2010.  Findings: Eventration of the left hemidiaphragm is unchanged. There is some scar in the left lung base.   Right lung is clear.  No pneumothorax or effusion.  Heart size is normal.  Surgical clips in the left axilla are noted.  IMPRESSION: No acute finding.  Original Report Authenticated By: Bernadene Bell. Maricela Curet, M.D.   Ct Angio Chest W/cm &/or Wo Cm  06/25/2011  *RADIOLOGY REPORT*  Clinical Data:  Cough and shortness of breath.  Postop from knee replacement surgery.  Elevated D-dimer.  Clinical suspicion for pulmonary embolism.  CT ANGIOGRAPHY CHEST WITH CONTRAST  Technique:  Multidetector CT imaging of the chest was performed using the standard protocol during bolus administration of intravenous contrast.  Multiplanar CT image reconstructions including MIPs were obtained to evaluate the vascular anatomy.  Contrast: OMNIPAQUE IOHEXOL 300 MG/ML IV SOLN  Comparison:  09/24/2005  Findings:  Satisfactory opacification of the pulmonary arteries noted, and there is no evidence of pulmonary emboli.  There is no evidence of thoracic aortic aneurysm or dissection.  No evidence of mediastinal or hilar masses.  No adenopathy identified elsewhere within the thorax. No evidence of axillary lymphadenopathy or chest wall soft tissue mass.  Prior left axillary lymph node dissection noted.  Mild scarring is noted in the left lower lobe.  Chronic scarring in the right middle lobe and lingula is unchanged.  No evidence of suspicious pulmonary nodules or masses.  No endobronchial lesion identified.  No evidence of pleural or pericardial effusion.  Mild cardiomegaly noted.  No suspicious bone lesions identified.  Review of the MIP images confirms the above findings.  IMPRESSION:  1.  No evidence of pulmonary embolism or other acute findings. 2.  Bibasilar scarring, left side greater than right. 3.  Mild cardiomegaly.  Original Report Authenticated By: Danae Orleans, M.D.   Dg Chest Port 1 View  06/25/2011  *RADIOLOGY REPORT*  Clinical Data: 77 year old female with cough, weakness, shortness of breath.  PORTABLE CHEST - 1 VIEW   Comparison: 06/21/2011 and earlier.  Findings: Portable semi upright AP view at 1348 hours.  Chronic elevation of the left hemidiaphragm and patchy left basilar opacity has not significantly changed since 2011.  No pneumothorax or pulmonary edema.  Small pleural effusions cannot be excluded. Stable cardiac size and mediastinal contours.  Postoperative changes to the left axilla.  IMPRESSION: Chronic hypoventilation and atelectasis at the left lung base is mildly worsened.  Cannot exclude small pleural effusions.  Original Report Authenticated By: Harley Hallmark, M.D.    Disposition:   Discharge Orders    Future Orders Please Complete By Expires   Diet - low sodium heart healthy      Diet - low sodium heart healthy      Call MD / Call 911      Comments:   If you experience chest pain or shortness of breath, CALL 911 and be transported to the hospital emergency room.  If you develope a fever above 101 F, pus (white drainage) or increased drainage or redness at the wound, or calf pain, call your surgeon's office.   Constipation Prevention      Comments:   Drink plenty of fluids.  Prune juice may be helpful.  You may use a stool softener, such as Colace (over the counter) 100 mg twice a day.  Use MiraLax (over the counter) for constipation as needed.   Increase activity slowly as tolerated      Weight Bearing as taught in Physical Therapy      Comments:   Use a walker or crutches as instructed.   Discharge instructions      Comments:   Pick up stool  softner and laxative for home. Do not submerge incision under water. May shower. Continue to use ice for pain and swelling from surgery.    Driving restrictions      Comments:   No driving   Lifting restrictions      Comments:   No lifting   TED hose      Comments:   Use stockings (TED hose) for 3 weeks on both leg(s).  You may remove them at night for sleeping.   Change dressing      Comments:   Change dressing daily with sterile 4 x 4  inch gauze dressing and apply TED hose.   Do not put a pillow under the knee. Place it under the heel.      Call MD / Call 911      Comments:   If you experience chest pain or shortness of breath, CALL 911 and be transported to the hospital emergency room.  If you develope a fever above 101 F, pus (white drainage) or increased drainage or redness at the wound, or calf pain, call your surgeon's office.   Constipation Prevention      Comments:   Drink plenty of fluids.  Prune juice may be helpful.  You may use a stool softener, such as Colace (over the counter) 100 mg twice a day.  Use MiraLax (over the counter) for constipation as needed.   Increase activity slowly as tolerated      Weight Bearing as taught in Physical Therapy      Comments:   Use a walker or crutches as instructed.   Change dressing      Comments:   Change dressing tomorrow, then change the dressing daily with sterile 4 x 4 inch gauze dressing and apply TED hose.  You may clean the incision with alcohol prior to redressing.   Do not put a pillow under the knee. Place it under the heel.         Follow-up Information    Follow up with ALUISIO,FRANK V. Make an appointment in 2 weeks. (Please have SNF staff help arrange appointment for patient on Jan 3rd and help make arragnements for transportation.)    Contact information:   [facility] [address], [address] [place] [address] [phone]           Signed: Thea Gist 06/28/2011, 9:35 AM

## 2011-06-28 NOTE — Progress Notes (Signed)
ANTICOAGULATION CONSULT NOTE - Follow Up Consult  Pharmacy Consult for Coumadin Indication: VTE prophylaxis s/p TKA  No Known Allergies  Patient Measurements: Height: 5\' 4"  (162.6 cm) Weight: 198 lb (89.812 kg) IBW/kg (Calculated) : 54.7    Vital Signs: Temp: 97.6 F (36.4 C) (12/24 0600) Temp src: Oral (12/24 0600) BP: 105/70 mmHg (12/24 0600) Pulse Rate: 86  (12/24 0600)  Labs:  Basename 06/28/11 0435 06/27/11 0700 06/27/11 0645 06/26/11 0635 06/26/11 0101 06/25/11 1850 06/25/11 1720  HGB -- -- -- 10.7* -- -- 10.4*  HCT -- -- -- 32.3* -- -- 32.3*  PLT -- -- -- 168 -- -- 150  APTT -- -- -- -- -- -- --  LABPROT 20.9* -- 20.7* 21.3* -- -- --  INR 1.77* -- 1.74* 1.81* -- -- --  HEPARINUNFRC -- -- -- -- -- -- --  CREATININE -- 0.76 -- 0.78 -- -- 0.85  CKTOTAL -- -- -- 102 116 148 --  CKMB -- -- -- 4.1* 3.8 4.6* --  TROPONINI -- -- -- <0.30 <0.30 <0.30 --   Estimated Creatinine Clearance: 61.8 ml/min (by C-G formula based on Cr of 0.76).   Medications:  Scheduled:     . carbidopa-levodopa  0.5 tablet Oral BID  . clonazePAM  0.25 mg Oral QHS  . docusate sodium  100 mg Oral BID  . enoxaparin  30 mg Subcutaneous Q12H  . nortriptyline  10 mg Oral TID  . nortriptyline  30 mg Oral QHS  . QUEtiapine  12.5 mg Oral QHS  . Ropinirole HCl  1 tablet Oral QHS  . warfarin  7.5 mg Oral ONCE-1800   Infusions:   PRN: acetaminophen, acetaminophen, haloperidol lactate, HYDROcodone-acetaminophen, menthol-cetylpyridinium, methocarbamol(ROBAXIN) IV, methocarbamol, metoCLOPramide (REGLAN) injection, metoCLOPramide, ondansetron (ZOFRAN) IV, ondansetron, phenol, traMADol  Assessment: -77 yo F s/p R TKA. Hx of DVT x 2 and Hx A.fib noted.  -On warfarin for DVT prophylaxis, along with prophylactic-dose Lovenox bridging. -INR was initially responding but has levelled off below therapeutic range.  Inpatient warfarin initiation sequence has been 7.5, 5, 5, 5, 7.5mg   Goal of Therapy:  INR  2-3   Plan:  -Boost with warfarin 10mg  x 1 today only. -Follow INR daily -Continue Lovenox 30mg  SQ q12h.  Has order from hospitalist to continue this until INR > 2 x 24 hrs.  Hope Budds PharmD 8:29 AM 06/28/2011

## 2011-06-28 NOTE — Progress Notes (Signed)
Subjective:  No dyspnea.  No recurrent tachycardia.  Mild pain.  O2 sats are OK.  Her only c/o is some nocturnal hallucinations and bad dreams.  Objective:  Vital Signs in the last 24 hours: BP 105/70  Pulse 86  Temp(Src) 97.6 F (36.4 C) (Oral)  Resp 20  Ht 5\' 4"  (1.626 m)  Wt 89.812 kg (198 lb)  BMI 33.99 kg/m2  SpO2 96%  Physical Exam: Pleasant, alert and in no acute distress.  Oriented. Lungs:  Clear Cardiac:  Regular rhythm, normal S1 and S2, no S3 Abdomen:  Soft, nontender, no masses Extremities:  No edema present  Intake/Output from previous day: 12/23 0701 - 12/24 0700 In: 420 [P.O.:420] Out: 1625 [Urine:1625]  Lab Results: Basic Metabolic Panel:  Basename 06/27/11 0700 06/26/11 0635  NA 136 136  K 4.0 3.4*  CL 98 99  CO2 33* 31  GLUCOSE 97 102*  BUN 10 9  CREATININE 0.76 0.78   CBC:  Basename 06/26/11 0635 06/25/11 1720  WBC 8.8 8.6  NEUTROABS -- 5.9  HGB 10.7* 10.4*  HCT 32.3* 32.3*  MCV 93.6 93.4  PLT 168 150   Cardiac Enzymes:  Basename 06/26/11 0635 06/26/11 0101 06/25/11 1850  CKTOTAL 102 116 148  CKMB 4.1* 3.8 4.6*  CKMBINDEX -- -- --  TROPONINI <0.30 <0.30 <0.30   Lab Results  Component Value Date   INR 1.77* 06/28/2011   INR 1.74* 06/27/2011   INR 1.81* 06/26/2011   Telemetry: Reviewed : Remains in normal sinus rhythm overnight  Assessment/Plan:  1. Tachycardia has improved and is likely multifactorial 2. Mild cardiomyopathy which is clinically stable 3. Significant Parkinson syndrome  Recommendations:  Her INR is still subtherapeutic and she will need to have additional warfarin today. From a cardiac point she may go to SNF.  D/c oxygen.  I need to see in office when she gets out the the nursing home.  Additional K today.   Darden Palmer.  MD [facility] 06/28/2011, 9:02 AM

## 2011-06-28 NOTE — Progress Notes (Signed)
Orthopedics Progress Note  Subjective: Pt resting comfortably in no acute distress. States she has had some hallucinations recently and questions the pain medication. States the knee is doing well  Objective:  Filed Vitals:   06/28/11 0600  BP: 105/70  Pulse: 86  Temp: 97.6 F (36.4 C)  Resp: 20    General: Awake and alert  Musculoskeletal: right knee incision healing well, nv intact distally good rom Neurovascularly intact  Lab Results  Component Value Date   WBC 8.8 06/26/2011   HGB 10.7* 06/26/2011   HCT 32.3* 06/26/2011   MCV 93.6 06/26/2011   PLT 168 06/26/2011       Component Value Date/Time   NA 136 06/27/2011 0700   K 4.0 06/27/2011 0700   CL 98 06/27/2011 0700   CO2 33* 06/27/2011 0700   GLUCOSE 97 06/27/2011 0700   BUN 10 06/27/2011 0700   CREATININE 0.76 06/27/2011 0700   CALCIUM 8.7 06/27/2011 0700   GFRNONAA 78* 06/27/2011 0700   GFRAA >90 06/27/2011 0700    Lab Results  Component Value Date   INR 1.77* 06/28/2011   INR 1.74* 06/27/2011   INR 1.81* 06/26/2011    Assessment/Plan: s/p Procedure(s): TOTAL KNEE ARTHROPLASTY right  Plan: d/c to snf today.  Follow up in 2 weeks with Dr. Carmie End R. Ranell Patrick, MD 06/28/2011 9:09 AM

## 2011-07-02 ENCOUNTER — Encounter (HOSPITAL_COMMUNITY): Payer: Self-pay | Admitting: Orthopedic Surgery

## 2011-07-28 ENCOUNTER — Emergency Department (HOSPITAL_COMMUNITY)
Admission: EM | Admit: 2011-07-28 | Discharge: 2011-07-28 | Disposition: A | Discharge status: Home / Self Care | Payer: Medicare Other | Attending: Emergency Medicine | Admitting: Emergency Medicine

## 2011-07-28 ENCOUNTER — Encounter (HOSPITAL_COMMUNITY): Payer: Self-pay | Admitting: *Deleted

## 2011-07-28 ENCOUNTER — Emergency Department (HOSPITAL_COMMUNITY): Payer: Medicare Other

## 2011-07-28 DIAGNOSIS — M79609 Pain in unspecified limb: Secondary | ICD-10-CM

## 2011-07-28 DIAGNOSIS — G20A1 Parkinson's disease without dyskinesia, without mention of fluctuations: Secondary | ICD-10-CM | POA: Insufficient documentation

## 2011-07-28 DIAGNOSIS — M7989 Other specified soft tissue disorders: Secondary | ICD-10-CM

## 2011-07-28 DIAGNOSIS — I739 Peripheral vascular disease, unspecified: Secondary | ICD-10-CM | POA: Insufficient documentation

## 2011-07-28 DIAGNOSIS — L0390 Cellulitis, unspecified: Secondary | ICD-10-CM

## 2011-07-28 DIAGNOSIS — I4891 Unspecified atrial fibrillation: Secondary | ICD-10-CM | POA: Insufficient documentation

## 2011-07-28 DIAGNOSIS — K449 Diaphragmatic hernia without obstruction or gangrene: Secondary | ICD-10-CM | POA: Insufficient documentation

## 2011-07-28 DIAGNOSIS — Z86718 Personal history of other venous thrombosis and embolism: Secondary | ICD-10-CM | POA: Insufficient documentation

## 2011-07-28 DIAGNOSIS — Z7901 Long term (current) use of anticoagulants: Secondary | ICD-10-CM | POA: Insufficient documentation

## 2011-07-28 DIAGNOSIS — G20 Parkinson's disease: Secondary | ICD-10-CM | POA: Insufficient documentation

## 2011-07-28 DIAGNOSIS — I10 Essential (primary) hypertension: Secondary | ICD-10-CM | POA: Insufficient documentation

## 2011-07-28 DIAGNOSIS — Z96649 Presence of unspecified artificial hip joint: Secondary | ICD-10-CM | POA: Insufficient documentation

## 2011-07-28 DIAGNOSIS — Z853 Personal history of malignant neoplasm of breast: Secondary | ICD-10-CM | POA: Insufficient documentation

## 2011-07-28 DIAGNOSIS — Z79899 Other long term (current) drug therapy: Secondary | ICD-10-CM | POA: Insufficient documentation

## 2011-07-28 DIAGNOSIS — Z7982 Long term (current) use of aspirin: Secondary | ICD-10-CM | POA: Insufficient documentation

## 2011-07-28 LAB — PROTIME-INR
INR: 1.18 (ref 0.00–1.49)
Prothrombin Time: 15.2 seconds (ref 11.6–15.2)

## 2011-07-28 LAB — COMPREHENSIVE METABOLIC PANEL
Alkaline Phosphatase: 121 U/L — ABNORMAL HIGH (ref 39–117)
BUN: 16 mg/dL (ref 6–23)
CO2: 30 mEq/L (ref 19–32)
GFR calc Af Amer: 62 mL/min — ABNORMAL LOW (ref >90)
GFR calc non Af Amer: 54 mL/min — ABNORMAL LOW (ref >90)
Glucose, Bld: 117 mg/dL — ABNORMAL HIGH (ref 70–99)
Potassium: 3.1 mEq/L — ABNORMAL LOW (ref 3.5–5.1)
Total Bilirubin: 0.4 mg/dL (ref 0.3–1.2)
Total Protein: 7.3 g/dL (ref 6.0–8.3)

## 2011-07-28 LAB — CBC
HCT: 39.8 % (ref 36.0–46.0)
Platelets: 269 10*3/uL (ref 150–400)
RDW: 15.3 % (ref 11.5–15.5)
WBC: 5.3 10*3/uL (ref 4.0–10.5)

## 2011-07-28 MED ORDER — CLINDAMYCIN HCL 300 MG PO CAPS
300.0000 mg | ORAL_CAPSULE | Freq: Once | ORAL | Status: AC
  Administered 2011-07-28: 300 mg via ORAL
  Filled 2011-07-28: qty 1

## 2011-07-28 MED ORDER — CLINDAMYCIN HCL 150 MG PO CAPS: 300.0000 mg | ORAL_CAPSULE | Freq: Three times a day (TID) | ORAL | Status: AC

## 2011-07-28 NOTE — ED Notes (Signed)
Pt reports noticing R leg swelling and discomfort yesterday.  Recently had a R knee replacement-06/23/11.  Pt reports mild SOB with exertion but is able to lay flat to sleep.

## 2011-07-28 NOTE — ED Provider Notes (Signed)
History     CSN: [id number]  Arrival date & time 07/28/11  1337   First MD Initiated Contact with Patient 07/28/11 1556      Chief Complaint  Patient presents with  . Leg Pain  . Leg Swelling    (Consider location/radiation/quality/duration/timing/severity/associated sxs/prior treatment) HPI The patient presents with one day of edema and erythema about the right lower extremity.  She notes that her symptoms began gradually and since onset has been progressive.  She denies any significant pain, or any limit to her range of motion.  She denies any new dyspnea, any new, chest pain, any new, abdominal pain.  No clear exacerbating or alleviating factors Notably, the patient had a right knee total replacement.  One month ago, she notes that she had been doing well in her rehabilitation efforts.  Since that procedure.  She has been taking Coumadin for perioperative prophylaxis.  She also does have a history of a DVT. Past Medical History  Diagnosis Date  . Dysrhythmia     atrial fibrilliation  . Hypertension     hyperlipidemia-  TEE,CARDIOLYTE STRESS 11/12 with LOV  DR TILLEY AND EKG ON CHART  . Peripheral vascular disease     Hx   DVT x 2  2000  . H/O hiatal hernia   . Neuromuscular disorder     Parkinsons, managed by Dr Anne Hahn-  LOV 11/12  . Bladder infection     06/10/11 with treatment by Dr Timothy Lasso  . Arthritis   . Cancer     2000- breast cancer followed by 6 months of chemo, radiation and  PO meds  . History of DVT (deep vein thrombosis) 06/24/2002    Past Surgical History  Procedure Date  . Breast surgery     LUMPECTOMY  WITH AXILLIARY DISSECTION   2000  . Back surgery     lumbar x 3  ? type  . Tonsillectomy   . Cardiac catheterization     bilateral cataract extraction with IOL  . Cholecystectomy   . Joint replacement     RIGHT HIP REPLACEMENT   1996  . Total knee arthroplasty 06/23/2011    Procedure: TOTAL KNEE ARTHROPLASTY;  Surgeon: Loanne Drilling;  Location: [facility]  [facility];  Service: Orthopedics;  Laterality: Right;    No family history on file.  History  Substance Use Topics  . Smoking status: Never Smoker   . Smokeless tobacco: Never Used  . Alcohol Use: No    OB History    Grav Para Term Preterm Abortions TAB SAB Ect Mult Living                  Review of Systems  Constitutional: Negative for fever and chills.  HENT: Negative.   Eyes: Negative.   Respiratory: Negative for shortness of breath.   Cardiovascular: Positive for leg swelling. Negative for chest pain and palpitations.  Gastrointestinal: Negative for nausea, vomiting and diarrhea.  Genitourinary: Negative.   Musculoskeletal:       Hpi  Skin: Positive for color change.  Neurological: Negative for weakness.    Allergies  Review of patient's allergies indicates no known allergies.  Home Medications   Current Outpatient Rx  Name Route Sig Dispense Refill  . ASPIRIN EC 81 MG PO TBEC Oral Take 81 mg by mouth daily.    Marland Kitchen CALCIUM CARBONATE-VITAMIN D 600-400 MG-UNIT PO CHEW Oral Chew 1 tablet by mouth daily.    Marland Kitchen CARBIDOPA-LEVODOPA 25-100 MG PO TABS Oral Take 0.5 tablets  by mouth 2 (two) times daily.    Marland Kitchen CLONAZEPAM 0.5 MG PO TABS Oral Take 0.25 mg by mouth 2 (two) times daily.     . OMEGA-3 FATTY ACIDS 1000 MG PO CAPS Oral Take 1 g by mouth daily.    . FUROSEMIDE 40 MG PO TABS Oral Take 40 mg by mouth daily.    Marland Kitchen HYDROCHLOROTHIAZIDE 25 MG PO TABS Oral Take 25 mg by mouth daily.      . MELOXICAM 15 MG PO TABS Oral Take 15 mg by mouth daily.    Marland Kitchen POLYETHYLENE GLYCOL 3350 PO PACK Oral Take 17 g by mouth daily.    Marland Kitchen POLYSACCHARIDE IRON 150 MG PO CAPS Oral Take 150 mg by mouth daily.    . QUETIAPINE FUMARATE 25 MG PO TABS Oral Take 12.5 mg by mouth at bedtime.     Marland Kitchen ROPINIROLE HCL ER 12 MG PO TB24 Oral Take 1 tablet by mouth at bedtime.     . TRAMADOL HCL 50 MG PO TABS Oral Take 50 mg by mouth every 6 (six) hours as needed. Maximum dose= 8 tablets per day. PAIN     . WARFARIN  SODIUM 6 MG PO TABS Oral Take 6 mg by mouth daily.      BP 161/75  Pulse 109  Temp(Src) 98.1 F (36.7 C) (Oral)  Resp 18  SpO2 99%  Physical Exam  Nursing note and vitals reviewed. Constitutional: She is oriented to person, place, and time. She appears well-developed and well-nourished. No distress.  HENT:  Head: Normocephalic and atraumatic.  Eyes: Conjunctivae are normal. Pupils are equal, round, and reactive to light.  Cardiovascular: Regular rhythm.  Tachycardia present.   Pulmonary/Chest: Effort normal and breath sounds normal. No stridor.  Abdominal: Soft. Normal appearance. There is no tenderness.  Musculoskeletal:       Legs:      Erythema from just proximal to the right knee to the mid foot with associated edema in all of the erythematous areas. Range of motion of right knee 180/75 with no crepitus. No tenderness to palpation of the knee. Strength in knee/hip/ankle 5/5 symmetrically w Left  Neurological: She is alert and oriented to person, place, and time. No cranial nerve deficit. She exhibits normal muscle tone. Coordination normal.  Skin: Skin is warm and dry. There is erythema.  Psychiatric: She has a normal mood and affect.    ED Course  Procedures (including critical care time)   Labs Reviewed  COMPREHENSIVE METABOLIC PANEL  CBC  PROTIME-INR   No results found.   No diagnosis found.  xr (reviewed by me) no acute findings  MDM   this 78 year old female presents with new right lower extremity edema, and erythema one month after a right total knee replacement.  On exam the patient is in no distress, is afebrile, and has no appreciable pain with motion of the knee, nor any appreciable effusion.  The patient's labs, ultrasound, x-ray are all reassuring for the absence of DVT or acute changes in the hardware.  Given the patient's mild tachycardia, her erythematous, changes, she'll be treated for presumptive cellulitis and be discharged in stable condition to  follow up with her orthopedist tomorrow        Gerhard Munch, MD 07/28/11 1712

## 2011-07-28 NOTE — Progress Notes (Signed)
Right lower extremity venous duplex completed.  Preliminary report is negative for DVT or SVT in the right leg.  There appears to be a Baker's cyst.

## 2011-09-01 ENCOUNTER — Telehealth: Payer: Self-pay | Admitting: *Deleted

## 2011-09-01 NOTE — Telephone Encounter (Signed)
LM for patient with son.  He will have his mother call us back if she is interested. .  I explained her last colonoscopy was 12/2004 and she can call us back about scheduling her next colonoscopy.

## 2011-10-10 ENCOUNTER — Encounter (HOSPITAL_COMMUNITY): Payer: Self-pay | Admitting: Neurology

## 2011-10-10 ENCOUNTER — Emergency Department (HOSPITAL_COMMUNITY): Payer: Medicare Other

## 2011-10-10 ENCOUNTER — Inpatient Hospital Stay (HOSPITAL_COMMUNITY)
Admission: EM | Admit: 2011-10-10 | Discharge: 2011-10-18 | DRG: 176 | Disposition: A | Discharge status: Home / Self Care | Payer: Medicare Other | Source: Ambulatory Visit | Attending: Internal Medicine | Admitting: Internal Medicine

## 2011-10-10 ENCOUNTER — Other Ambulatory Visit: Payer: Self-pay

## 2011-10-10 DIAGNOSIS — Z853 Personal history of malignant neoplasm of breast: Secondary | ICD-10-CM

## 2011-10-10 DIAGNOSIS — G20A1 Parkinson's disease without dyskinesia, without mention of fluctuations: Secondary | ICD-10-CM | POA: Diagnosis present

## 2011-10-10 DIAGNOSIS — M899 Disorder of bone, unspecified: Secondary | ICD-10-CM | POA: Diagnosis present

## 2011-10-10 DIAGNOSIS — G20 Parkinson's disease: Secondary | ICD-10-CM | POA: Diagnosis present

## 2011-10-10 DIAGNOSIS — I10 Essential (primary) hypertension: Secondary | ICD-10-CM

## 2011-10-10 DIAGNOSIS — I119 Hypertensive heart disease without heart failure: Secondary | ICD-10-CM | POA: Diagnosis present

## 2011-10-10 DIAGNOSIS — I4891 Unspecified atrial fibrillation: Secondary | ICD-10-CM | POA: Diagnosis present

## 2011-10-10 DIAGNOSIS — R55 Syncope and collapse: Secondary | ICD-10-CM

## 2011-10-10 DIAGNOSIS — R5381 Other malaise: Secondary | ICD-10-CM | POA: Diagnosis present

## 2011-10-10 DIAGNOSIS — Z86718 Personal history of other venous thrombosis and embolism: Secondary | ICD-10-CM

## 2011-10-10 DIAGNOSIS — G4700 Insomnia, unspecified: Secondary | ICD-10-CM | POA: Diagnosis present

## 2011-10-10 DIAGNOSIS — I739 Peripheral vascular disease, unspecified: Secondary | ICD-10-CM | POA: Diagnosis present

## 2011-10-10 DIAGNOSIS — I428 Other cardiomyopathies: Secondary | ICD-10-CM | POA: Diagnosis present

## 2011-10-10 DIAGNOSIS — I2699 Other pulmonary embolism without acute cor pulmonale: Principal | ICD-10-CM | POA: Diagnosis present

## 2011-10-10 DIAGNOSIS — R000 Tachycardia, unspecified: Secondary | ICD-10-CM

## 2011-10-10 DIAGNOSIS — Z96659 Presence of unspecified artificial knee joint: Secondary | ICD-10-CM

## 2011-10-10 DIAGNOSIS — E785 Hyperlipidemia, unspecified: Secondary | ICD-10-CM | POA: Diagnosis present

## 2011-10-10 DIAGNOSIS — E669 Obesity, unspecified: Secondary | ICD-10-CM | POA: Diagnosis present

## 2011-10-10 HISTORY — DX: Parkinsonism, unspecified: G20.C

## 2011-10-10 HISTORY — DX: Parkinson's disease: G20

## 2011-10-10 LAB — URINALYSIS, ROUTINE W REFLEX MICROSCOPIC
Glucose, UA: NEGATIVE mg/dL
Protein, ur: NEGATIVE mg/dL
Specific Gravity, Urine: 1.025 (ref 1.005–1.030)
pH: 6 (ref 5.0–8.0)

## 2011-10-10 LAB — GLUCOSE, CAPILLARY: Glucose-Capillary: 110 mg/dL — ABNORMAL HIGH (ref 70–99)

## 2011-10-10 LAB — DIFFERENTIAL
Basophils Relative: 0 % (ref 0–1)
Eosinophils Absolute: 0.2 10*3/uL (ref 0.0–0.7)
Eosinophils Relative: 2 % (ref 0–5)
Monocytes Absolute: 0.5 10*3/uL (ref 0.1–1.0)
Monocytes Relative: 6 % (ref 3–12)

## 2011-10-10 LAB — COMPREHENSIVE METABOLIC PANEL
Albumin: 4 g/dL (ref 3.5–5.2)
BUN: 35 mg/dL — ABNORMAL HIGH (ref 6–23)
Calcium: 9.9 mg/dL (ref 8.4–10.5)
Creatinine, Ser: 1.16 mg/dL — ABNORMAL HIGH (ref 0.50–1.10)
Total Bilirubin: 0.7 mg/dL (ref 0.3–1.2)
Total Protein: 7 g/dL (ref 6.0–8.3)

## 2011-10-10 LAB — PROTIME-INR: Prothrombin Time: 13.4 seconds (ref 11.6–15.2)

## 2011-10-10 LAB — PRO B NATRIURETIC PEPTIDE: Pro B Natriuretic peptide (BNP): 1628 pg/mL — ABNORMAL HIGH (ref 0–450)

## 2011-10-10 LAB — CBC
HCT: 45.3 % (ref 36.0–46.0)
Hemoglobin: 14.9 g/dL (ref 12.0–15.0)
MCH: 29.9 pg (ref 26.0–34.0)
MCHC: 32.9 g/dL (ref 30.0–36.0)
RDW: 16.2 % — ABNORMAL HIGH (ref 11.5–15.5)

## 2011-10-10 LAB — D-DIMER, QUANTITATIVE: D-Dimer, Quant: 11.31 ug/mL-FEU — ABNORMAL HIGH (ref 0.00–0.48)

## 2011-10-10 LAB — CARDIAC PANEL(CRET KIN+CKTOT+MB+TROPI)
CK, MB: 2.5 ng/mL (ref 0.3–4.0)
Relative Index: INVALID (ref 0.0–2.5)
Troponin I: <0.3 ng/mL (ref <0.30)

## 2011-10-10 LAB — URINE MICROSCOPIC-ADD ON

## 2011-10-10 MED ORDER — SODIUM CHLORIDE 0.9 % IV SOLN
INTRAVENOUS | Status: DC
  Administered 2011-10-10 – 2011-10-11 (×2): 1000 mL via INTRAVENOUS
  Administered 2011-10-11 – 2011-10-12 (×2): via INTRAVENOUS
  Administered 2011-10-13: 10 mL/h via INTRAVENOUS

## 2011-10-10 MED ORDER — OMEGA-3 FATTY ACIDS 1000 MG PO CAPS: 1.0000 g | ORAL_CAPSULE | Freq: Every day | ORAL | Status: DC

## 2011-10-10 MED ORDER — SODIUM CHLORIDE 0.9 % IV SOLN
INTRAVENOUS | Status: AC
  Administered 2011-10-10: 18:00:00 via INTRAVENOUS

## 2011-10-10 MED ORDER — HEPARIN (PORCINE) IN NACL 100-0.45 UNIT/ML-% IJ SOLN
1100.0000 [IU]/h | INTRAMUSCULAR | Status: DC
  Administered 2011-10-10: 1100 [IU]/h via INTRAVENOUS
  Filled 2011-10-10: qty 250

## 2011-10-10 MED ORDER — SODIUM CHLORIDE 0.9 % IV SOLN
INTRAVENOUS | Status: DC
  Administered 2011-10-10: 12:00:00 via INTRAVENOUS

## 2011-10-10 MED ORDER — CALCIUM CARBONATE-VITAMIN D 500-200 MG-UNIT PO TABS
1.0000 | ORAL_TABLET | Freq: Every day | ORAL | Status: DC
  Administered 2011-10-11 – 2011-10-18 (×8): 1 via ORAL
  Filled 2011-10-10 (×8): qty 1

## 2011-10-10 MED ORDER — QUETIAPINE 12.5 MG HALF TABLET
12.5000 mg | ORAL_TABLET | Freq: Every day | ORAL | Status: DC
  Administered 2011-10-10 – 2011-10-17 (×8): 12.5 mg via ORAL
  Filled 2011-10-10 (×9): qty 1

## 2011-10-10 MED ORDER — CLONAZEPAM 0.5 MG PO TABS
0.2500 mg | ORAL_TABLET | Freq: Two times a day (BID) | ORAL | Status: DC
  Administered 2011-10-10: 0.25 mg via ORAL
  Filled 2011-10-10: qty 1

## 2011-10-10 MED ORDER — CALCIUM CARBONATE-VITAMIN D 600-400 MG-UNIT PO CHEW: 1.0000 | CHEWABLE_TABLET | Freq: Every day | ORAL | Status: DC

## 2011-10-10 MED ORDER — ASPIRIN EC 81 MG PO TBEC
81.0000 mg | DELAYED_RELEASE_TABLET | Freq: Every day | ORAL | Status: DC
  Administered 2011-10-11 – 2011-10-18 (×8): 81 mg via ORAL
  Filled 2011-10-10 (×8): qty 1

## 2011-10-10 MED ORDER — IOHEXOL 350 MG/ML SOLN
60.0000 mL | Freq: Once | INTRAVENOUS | Status: AC | PRN
  Administered 2011-10-10: 60 mL via INTRAVENOUS

## 2011-10-10 MED ORDER — CARBIDOPA-LEVODOPA 25-100 MG PO TABS
0.5000 | ORAL_TABLET | Freq: Two times a day (BID) | ORAL | Status: DC
  Administered 2011-10-10 – 2011-10-17 (×15): 0.5 via ORAL
  Administered 2011-10-18: 10:00:00 via ORAL
  Filled 2011-10-10 (×25): qty 0.5

## 2011-10-10 MED ORDER — ROPINIROLE HCL ER 12 MG PO TB24
1.0000 | ORAL_TABLET | Freq: Every day | ORAL | Status: DC
  Administered 2011-10-12 – 2011-10-17 (×6): 12 mg via ORAL
  Filled 2011-10-10 (×2): qty 1

## 2011-10-10 MED ORDER — HEPARIN BOLUS VIA INFUSION
4000.0000 [IU] | Freq: Once | INTRAVENOUS | Status: AC
  Administered 2011-10-10: 4000 [IU] via INTRAVENOUS

## 2011-10-10 MED ORDER — OMEGA-3-ACID ETHYL ESTERS 1 G PO CAPS
1.0000 g | ORAL_CAPSULE | Freq: Every day | ORAL | Status: DC
  Administered 2011-10-11 – 2011-10-18 (×8): 1 g via ORAL
  Filled 2011-10-10 (×8): qty 1

## 2011-10-10 NOTE — ED Notes (Addendum)
Pt was walking into church this morning and felt faint.  Pt subsequently had to sit down.  Pt denies CP or SOB.  EMS reports that when placed on monitor pt was Afib with a rate of 170.  Pt has hx of Afib, but denies RVR.  EMS reports that nurse at church reported pt's BP to be 180/90.  EMS reports BP 130/70 sitting and 110/60 standing.  EMS reports CBG 124.

## 2011-10-10 NOTE — H&P (Addendum)
Angel Greene is an 78 y.o. female.   PCP:   No primary provider on file.   Chief Complaint:  B Saddle Embolus/PE/ Pre-Syncope  HPI: 23 F With PD on mult meds, H/O prior DVTs, recent R TKR 06/2011 complicated by hallucinations and FTT, and Sig Back pains S/P recent ESI last Monday.  Only issue with ESI was incontinence that has resolved.  She has been in her usual state of health until 2-3 days ago when she felt lazy, tired, minimum SOB,  No CP.  No major issues.  No leg pain.  She has been walking fine on the R leg.  She has used the cane in and out of the house.  Back was better for a few days and then pain had returned.  She is due to see Dr Danielle Dess in follow up soon.  She went to [facility] today and had some symptoms getting out of car and while moving into church she was dizzy and lightheaded and leaned up against the wall.  She was lowered to the ground.  She felt faint/Pre Syncope. Pt denied CP, Diaphoresis, or SOB. EMS reports that when placed on monitor pt was Afib with a rate of 170. Pt has hx of Afib.  EMS reports that nurse at church reported pt's BP to be 180/90. EMS reports BP 130/70 sitting and 110/60 standing. EMS reports CBG 124.  EKG here = Sinus Tachy.  D DImer very high.  CTPA + for Saddle Embolus.  She has been started on Heparin and IVF.  Never lost consciousness and did nit pass out.  Dr Donnie Aho is her Cardiologist, Dr Lequita Halt is her Ortho,  and Dr Anne Hahn is her Neurologist.  She will be admitted.     Past Medical History:  Past Medical History  Diagnosis Date  . Dysrhythmia     atrial fibrilliation  . Hypertension     hyperlipidemia-  TEE,CARDIOLYTE STRESS 11/12 with LOV  DR TILLEY AND EKG ON CHART  . Peripheral vascular disease     Hx   DVT x 2  2000  . H/O hiatal hernia   . Neuromuscular disorder     Parkinsons, managed by Dr Anne Hahn-  LOV 11/12  . Bladder infection     06/10/11 with treatment by Dr Timothy Lasso  . Arthritis   . Cancer     2000- breast cancer followed by 6  months of chemo, radiation and  PO meds  . History of DVT (deep vein thrombosis) 06/24/2002  . Parkinsonism   . Breast cancer     Past Medical History:  1. H/O Urosepsis with fever, leukocytosis, confusion, dehydration, and urinary tract infection 10/2009.   2. Confusion and hallucinations, worsened by underlying illness in 10/2009 and 07/2011 (post TKR) - related to underlying Parkinson's disease  3. Parkinson's disease/Rapid eye movement sleep disorder. --Dr Anne Hahn.   4. Severe right-sided  Spinal Stenosis/Low back pain/Sciatica--. status post lumbosacral spine surgery per Dr. Danielle Dess.  ESI not successful.   5. Osteoarthritis with recent bilateral knee steroid injections per  Dr. Lequita Halt. S/P  TKR 06/23/11  6. Paroxysmal atrial fibrillation during 10/2009 hospitalization, has remained in normal sinus rhythm with medications.  CARDIOLOGY:  Ritta Slot, MD    8. Deconditioning,  and weakness slowly improving.   9. Cardiomyopathy with reduced ejection fraction of  40%-45%, this is asymptomatic.   10.Urinary retention.   11.History of precancerous mole removal.   12.Adenocarcinoma of the breast, status post lumpectomy, chemotherapy and radiation treatments.  13.Hypertension/ Chronic LE Edema.   14.Insomnia.   15.Impaired fasting glucose.   16.Hyperlipidemia.  17. Tounge Lesion removed--benign.  18.History of cholecystectomy.   19.Obesity.   20.Osteopenia.   21.History of abnormal TSH.   22.History of lower extremity deep vein thrombosis x2 in 2003.   23.History of fracture of the R hip, secondary to an accident.   24.History of pseudowheeze.   25.History of right hip replacement.   26. R Face Pre CA Lesion Dr Waylan Boga  27. Chronic Leukocytosis  28. mild shingles   Surgical History  S/P L TKR Gallstones/Cholestectomy-2006 Lumbar Spine Fusion-2008 Right Hip Replacement Lumpectomy-Chemo treatment/XRT-1999 L5 - S1 Decompression 03/2010 B cataract Dr Luciana Axe - 2012.    Past  Surgical History  Procedure Date  . Breast surgery     LUMPECTOMY  WITH AXILLIARY DISSECTION   2000  . Back surgery     lumbar x 3  ? type  . Tonsillectomy   . Cardiac catheterization     bilateral cataract extraction with IOL  . Cholecystectomy   . Joint replacement     RIGHT HIP REPLACEMENT   1996  . Total knee arthroplasty 06/23/2011    Procedure: TOTAL KNEE ARTHROPLASTY;  Surgeon: Loanne Drilling;  Location: [facility]S;  Service: Orthopedics;  Laterality: Right;  . Breast lumpectomy   . Mastectomy   . Breast lumpectomy       Allergies:  No Known Allergies   Medications: Prior to Admission medications   Medication Sig Start Date End Date Taking? Authorizing Provider  aspirin EC 81 MG tablet Take 81 mg by mouth daily.   Yes Historical Provider, MD  Calcium Carbonate-Vitamin D (CALTRATE 600+D) 600-400 MG-UNIT per chew tablet Chew 1 tablet by mouth daily.   Yes Historical Provider, MD  carbidopa-levodopa (SINEMET) 25-100 MG per tablet Take 0.5 tablets by mouth 2 (two) times daily.   Yes Historical Provider, MD  clonazePAM (KLONOPIN) 0.5 MG tablet Take 0.25 mg by mouth 2 (two) times daily.    Yes Historical Provider, MD  fish oil-omega-3 fatty acids 1000 MG capsule Take 1 g by mouth daily.   Yes Historical Provider, MD  furosemide (LASIX) 40 MG tablet Take 40 mg by mouth daily.   Yes Historical Provider, MD  hydrochlorothiazide (HYDRODIURIL) 25 MG tablet Take 25 mg by mouth daily.     Yes Historical Provider, MD  meloxicam (MOBIC) 15 MG tablet Take 15 mg by mouth daily.   Yes Historical Provider, MD  QUEtiapine (SEROQUEL) 25 MG tablet Take 12.5 mg by mouth at bedtime.    Yes Historical Provider, MD  Ropinirole HCl (REQUIP XL) 12 MG TB24 Take 1 tablet by mouth at bedtime.    Yes Historical Provider, MD  traMADol (ULTRAM) 50 MG tablet Take 50 mg by mouth every 6 (six) hours as needed. Maximum dose= 8 tablets per day. PAIN    Yes Historical Provider, MD     Medications Prior to  Admission  Medication Dose Route Frequency Provider Last Rate Last Dose  . 0.9 %  sodium chloride infusion   Intravenous Continuous Carleene Cooper III, MD 125 mL/hr at 10/10/11 1149    . 0.9 %  sodium chloride infusion   Intravenous STAT Carleene Cooper III, MD 80 mL/hr at 10/10/11 1822    . heparin ADULT infusion 100 units/mL (25000 units/250 mL)  1,100 Units/hr Intravenous Continuous Lennon Alstrom, PHARMD 11 mL/hr at 10/10/11 1828 1,100 Units/hr at 10/10/11 1828  . heparin bolus via infusion 4,000 Units  4,000 Units Intravenous Once Glynn Octave, MD   4,000 Units at 10/10/11 1830  . iohexol (OMNIPAQUE) 350 MG/ML injection 60 mL  60 mL Intravenous Once PRN Medication Radiologist, MD   60 mL at 10/10/11 1703   Medications Prior to Admission  Medication Sig Dispense Refill  . aspirin EC 81 MG tablet Take 81 mg by mouth daily.      . Calcium Carbonate-Vitamin D (CALTRATE 600+D) 600-400 MG-UNIT per chew tablet Chew 1 tablet by mouth daily.      . carbidopa-levodopa (SINEMET) 25-100 MG per tablet Take 0.5 tablets by mouth 2 (two) times daily.      . clonazePAM (KLONOPIN) 0.5 MG tablet Take 0.25 mg by mouth 2 (two) times daily.       . fish oil-omega-3 fatty acids 1000 MG capsule Take 1 g by mouth daily.      . furosemide (LASIX) 40 MG tablet Take 40 mg by mouth daily.      . hydrochlorothiazide (HYDRODIURIL) 25 MG tablet Take 25 mg by mouth daily.        . meloxicam (MOBIC) 15 MG tablet Take 15 mg by mouth daily.      . QUEtiapine (SEROQUEL) 25 MG tablet Take 12.5 mg by mouth at bedtime.       . Ropinirole HCl (REQUIP XL) 12 MG TB24 Take 1 tablet by mouth at bedtime.       . traMADol (ULTRAM) 50 MG tablet Take 50 mg by mouth every 6 (six) hours as needed. Maximum dose= 8 tablets per day. PAIN          Social History:  reports that she has never smoked. She has never used smokeless tobacco. She reports that she does not drink alcohol or use illicit drugs. Married-lives with husband No  children Has a Masters Degree from [employer] Retired Runner, broadcasting/film/video Patient has No H/O tobacco or alcohol use  Family History: No family history on file. Father:D-CVA Mother:D-HTN, Breast CA 2 siblings both deceased-Suicide                                          CAD   Review of Systems: mild recent HAs - Tylenol, Fatigue, mild SOB, pre-Syncope      Denies fevers, chills, sweats, anorexia,  weight loss.        Recent rash is gone and she denies suspicious lesions.        No ab pain.       No current Bowel or Bladder issues.      Denies current CP/SOB or other Sxs.      See HPI    Flu UTD fall 2012 PNA given 05/28/2009 Td/Tdap  03 due this yr  06/03/2011 Stress Cardiolyte.CM EF 39% no evidence Ischemia- ECO prior to surgery in 05/26/2011 also ECHO. Concetric LVH with EF 50% Colonoscopy  12/2004  Tobacco use:  never smoker Passive smoke exposure:  no Drug use:  no HIV high-risk behavior:  no Caffeine use:  0 drinks per day Alcohol use:  no Exercise:  no Seatbelt use:  100 % Sun Exposure:  occasionally  Colonoscopy History:    Date of Last Colonoscopy:  12/03/2004  Mammogram History:    Date of Last Mammogram:  06/05/2010  PAP Smear History:    Date of Last PAP Smear:  05/28/2009   Physical Exam:  Blood pressure 96/82, pulse 81, temperature  97.9 F (36.6 C), temperature source Oral, resp. rate 22, height 5\' 6"  (1.676 m), weight 86.183 kg (190 lb), SpO2 97.00%. Filed Vitals:   10/10/11 1615 10/10/11 1630 10/10/11 1703 10/10/11 1731  BP: 101/62 91/67 118/69 96/82  Pulse: 95  113 81  Temp:    97.9 F (36.6 C)  TempSrc:    Oral  Resp: 19 21 16 22   Height:    5\' 6"  (1.676 m)  Weight:    86.183 kg (190 lb)  SpO2: 97%  94% 97%   General appearance: A and O Head: Normocephalic, without obvious abnormality, atraumatic Eyes: conjunctivae/corneas clear. PERRL, EOM's intact.  Nose: Nares normal. Septum midline. Mucosa normal. No drainage or sinus tenderness. Throat: lips,  mucosa, and tongue normal; teeth and gums normal Neck: no adenopathy, no carotid bruit, no JVD and thyroid not enlarged, symmetric, no tenderness/mass/nodules Resp: CTA Cardio:  Reg, Tachy GI: soft, non-tender; bowel sounds normal; no masses,  no organomegaly Extremities: RLE > LLE Tender Right Calf.  R TKR scar. Pulses: 2+ and symmetric Lymph nodes: no cervical lymphadenopathy Neurologic: Alert and oriented X 3, normal strength and tone. Normal symmetric reflexes.     Labs on Admission:   [facility] 10/10/11 1129  NA 141  K 4.1  CL 102  CO2 27  GLUCOSE 100*  BUN 35*  CREATININE 1.16*  CALCIUM 9.9  MG --  PHOS --    Basename 10/10/11 1129  AST 20  ALT 21  ALKPHOS 73  BILITOT 0.7  PROT 7.0  ALBUMIN 4.0   No results found for this basename: LIPASE:2,AMYLASE:2 in the last 72 hours  Basename 10/10/11 1129  WBC 8.2  NEUTROABS 5.7  HGB 14.9  HCT 45.3  MCV 90.8  PLT 165   No results found for this basename: CKTOTAL:3,CKMB:3,CKMBINDEX:3,TROPONINI:3 in the last 72 hours Lab Results  Component Value Date   INR 1.00 10/10/2011   INR 1.18 07/28/2011   INR 1.77* 06/28/2011     LAB RESULT POCT:  Results for orders placed during the hospital encounter of 10/10/11  GLUCOSE, CAPILLARY      Component Value Range   Glucose-Capillary 110 (*) 70 - 99 (mg/dL)   Comment 1 Notify RN    CBC      Component Value Range   WBC 8.2  4.0 - 10.5 (K/uL)   RBC 4.99  3.87 - 5.11 (MIL/uL)   Hemoglobin 14.9  12.0 - 15.0 (g/dL)   HCT 04.5  40.9 - 81.1 (%)   MCV 90.8  78.0 - 100.0 (fL)   MCH 29.9  26.0 - 34.0 (pg)   MCHC 32.9  30.0 - 36.0 (g/dL)   RDW 91.4 (*) 78.2 - 15.5 (%)   Platelets 165  150 - 400 (K/uL)  DIFFERENTIAL      Component Value Range   Neutrophils Relative 69  43 - 77 (%)   Neutro Abs 5.7  1.7 - 7.7 (K/uL)   Lymphocytes Relative 22  12 - 46 (%)   Lymphs Abs 1.8  0.7 - 4.0 (K/uL)   Monocytes Relative 6  3 - 12 (%)   Monocytes Absolute 0.5  0.1 - 1.0 (K/uL)    Eosinophils Relative 2  0 - 5 (%)   Eosinophils Absolute 0.2  0.0 - 0.7 (K/uL)   Basophils Relative 0  0 - 1 (%)   Basophils Absolute 0.0  0.0 - 0.1 (K/uL)  COMPREHENSIVE METABOLIC PANEL      Component Value Range   Sodium 141  135 - 145 (mEq/L)   Potassium 4.1  3.5 - 5.1 (mEq/L)   Chloride 102  96 - 112 (mEq/L)   CO2 27  19 - 32 (mEq/L)   Glucose, Bld 100 (*) 70 - 99 (mg/dL)   BUN 35 (*) 6 - 23 (mg/dL)   Creatinine, Ser 1.61 (*) 0.50 - 1.10 (mg/dL)   Calcium 9.9  8.4 - 09.6 (mg/dL)   Total Protein 7.0  6.0 - 8.3 (g/dL)   Albumin 4.0  3.5 - 5.2 (g/dL)   AST 20  0 - 37 (U/L)   ALT 21  0 - 35 (U/L)   Alkaline Phosphatase 73  39 - 117 (U/L)   Total Bilirubin 0.7  0.3 - 1.2 (mg/dL)   GFR calc non Af Amer 43 (*) >90 (mL/min)   GFR calc Af Amer 50 (*) >90 (mL/min)  URINALYSIS, ROUTINE W REFLEX MICROSCOPIC      Component Value Range   Color, Urine YELLOW  YELLOW    APPearance HAZY (*) CLEAR    Specific Gravity, Urine 1.025  1.005 - 1.030    pH 6.0  5.0 - 8.0    Glucose, UA NEGATIVE  NEGATIVE (mg/dL)   Hgb urine dipstick NEGATIVE  NEGATIVE    Bilirubin Urine NEGATIVE  NEGATIVE    Ketones, ur NEGATIVE  NEGATIVE (mg/dL)   Protein, ur NEGATIVE  NEGATIVE (mg/dL)   Urobilinogen, UA 0.2  0.0 - 1.0 (mg/dL)   Nitrite NEGATIVE  NEGATIVE    Leukocytes, UA MODERATE (*) NEGATIVE   PROTIME-INR      Component Value Range   Prothrombin Time 13.4  11.6 - 15.2 (seconds)   INR 1.00  0.00 - 1.49   APTT      Component Value Range   aPTT 24  24 - 37 (seconds)  PRO B NATRIURETIC PEPTIDE      Component Value Range   Pro B Natriuretic peptide (BNP) 1628.0 (*) 0 - 450 (pg/mL)  D-DIMER, QUANTITATIVE      Component Value Range   D-Dimer, Quant 11.31 (*) 0.00 - 0.48 (ug/mL-FEU)  POCT I-STAT TROPONIN I      Component Value Range   Troponin i, poc 0.01  0.00 - 0.08 (ng/mL)   Comment 3           URINE MICROSCOPIC-ADD ON      Component Value Range   Squamous Epithelial / LPF MANY (*) RARE    WBC, UA  7-10  <3 (WBC/hpf)   RBC / HPF 0-2  <3 (RBC/hpf)   Bacteria, UA FEW (*) RARE       Radiological Exams on Admission: Ct Angio Chest W/cm &/or Wo Cm  10/10/2011  *RADIOLOGY REPORT*  Clinical Data: There is syncope.  Elevated D-dimer.  Atrial fibrillation.  CT ANGIOGRAPHY CHEST  Technique:  Multidetector CT imaging of the chest using the standard protocol during bolus administration of intravenous contrast. Multiplanar reconstructed images including MIPs were obtained and reviewed to evaluate the vascular anatomy.  Contrast: 60mL OMNIPAQUE IOHEXOL 350 MG/ML SOLN  Comparison: Chest x-ray dated 10/10/2011 and CT angiogram of the chest dated 06/25/2011  Findings: The patient has a subtle pulmonary embolus, larger on the right than the left with occlusion of most of the branch pulmonary arteries on the right proximally.  The embolus in the left pulmonary artery is smaller but extends into the upper and lower lobes.  No infiltrates or or effusions.  Scarring in both lungs is stable. Overall heart size is normal.  No acute osseous abnormality.  IMPRESSION: Extensive bilateral pulmonary emboli.  Critical Value/emergent results were called by telephone at the time of interpretation on 10/10/2011  at 5:30 p.m.  to  the patient's  ER physician, who verbally acknowledged these results.  Original Report Authenticated By: Gwynn Burly, M.D.   Dg Chest Port 1 View  10/10/2011  *RADIOLOGY REPORT*  Clinical Data: Near syncope, tachycardia and weakness.  History of atrial fibrillation.  PORTABLE CHEST - 1 VIEW  Comparison: 06/25/2011  Findings: Stable mild cardiomegaly.  Stable elevation of the left hemidiaphragm with associated atelectasis and also chronic scarring at the left lung base.  No edema or infiltrate.  Stable tortuosity of the thoracic aorta.  IMPRESSION: No active disease.  Stable mild cardiomegaly and chronic elevation of the left hemidiaphragm.  Original Report Authenticated By: Reola Calkins, M.D.       Orders placed during the hospital encounter of 10/10/11  . EKG     Assessment/Plan Principal Problem:  *Pulmonary embolism, bilateral Active Problems:  Parkinson disease  Tachycardia  Hypertension   Problem #1 - Saddle Embolus with R Heart Strain, Tachycardia, and near Syncope.  -   Admit, Tele, O2, IV Heparin. 2 D Echo. She is HD stable and will be monitored and admitted due to the extensive clot. Thrombectomy not currently indicated. Will start Coumadin Tomorrow. She will need Lifelong Rx.   Problem # 2:  ESSENTIAL HYPERTENSION, BENIGN (ICD-401.1) BP is fine.   Problem # 3:  HYPERLIPIDEMIA (ICD-272.4) lipids are fine. Holding on meds unless absolutely needed.  Problem # 4:  IMPAIRED FASTING GLUCOSE (ICD-790.21) Hgb A1C: 5.0 Not DM - follow.  Problem # 5:  PARKINSON'S DISEASE (ICD-332.0)  Parkinson's disease--Dr Willis/Hallucinations/Rapid eye movement sleep disorder  having less hallucinations than she did after her R TKR. Her memory is currently baseline. Mild Current tremors. She remains on Requip. She is back on Sinemet. Doing better on Seroquel 25 HS    Problem # 6:  CARDIOMYOPATHY (ICD-425.4) EF 39- 50% on Echo prior to Surgery.  Will repeat. She is asymptomatic.  Watch for CHF with fluids.  Problem # 7:  THYROID STIMULATING HORMONE, ABNORMAL (ICD-246.9) TSH 4.57. Recheck   Problem # 8:  OBESITY (ICD-278.00) Needs some weight loss which would help her with her LBP, Radiculopathy, OA and other issues.   Problem # 9:  ATRIAL FIBRILLATION, PAROXYSMAL (ICD-427.31) Stay on low Dose BB.  Monitor.   Last Afib known was 10/2009   Problem # 10  OSTEOPENIA (ICD-733.90)  DEXA 12/15/10- Osteopenia by WHO Criteria.  No fractures on IVA but lots of changes c/w OA and prior L/S Surgeries.  S/P Lumbar Fusion and lami.  R Hip Fracture secondary to Accident and not considered fragility.  L Hip shows worsening BMD.  Radial Forearm; Back/L/S Spine BMD #s  difficult to read.  She remains on Ca and D.  She remains off femera and Tomoxifen that she was on in the past.  At this time i do not want to add any new meds and just wish to recheck DEXA and IVA in 2 years.  Problem # 11:  OSTEOARTHRITIS (ICD-715.90) Osteoarthritis  (Dr. Lequita Halt). S/P R TKR 06/23/11 complicated by worsening memory and difficulty getting back to baseline no current pains.  She has L TKR surgery in 10/2011 (on Hold due to fear of post op Confusion/Delerium>  Some mid -  LBP.    Urine is a little dirty - check Cx.  ??UTI??  Complete Medication  List: 1)  Carbidopa-levodopa 25-100 Mg Tabs (Carbidopa-levodopa) .... Take 0.5 tablets by mouth twice a day 2)  Aspirin 81 Mg Ec Tab (Aspirin) .... Take one (1) tablet by mouth daily 3)  Caltrate 600+d Tabs (Calcium carbonate-vitamin d tabs) .... Take one tablet by mouth once daily 4)  Clonazepam 0.5 Mg Tabs (Clonazepam) .... Qhs 5)  Fish Oil 1000 Mg Caps (Omega-3 fatty acids) .... One tablet by mouth once a day 6)  Hydrochlorothiazide 25 Mg Tabs (Hydrochlorothiazide) .Marland Kitchen.. 1 po qd 7)  Requip Xl 12 Mg Xr24h-tab (Ropinirole hcl) .Marland Kitchen.. 1 po qhs 8)  Mobic 15 Mg Tabs (Meloxicam) .Marland Kitchen.. 1 po qd 9)  Seroquel 25 Mg Tabs (Quetiapine fumarate) .Marland Kitchen.. 1 po at bedtime.   Avien Taha M 10/10/2011, 7:14 PM

## 2011-10-10 NOTE — ED Provider Notes (Cosign Needed)
History     CSN: [id number]  Arrival date & time 10/10/11  1025   First MD Initiated Contact with Patient 10/10/11 1114      Chief Complaint  Patient presents with  . Atrial Fibrillation    (Consider location/radiation/quality/duration/timing/severity/associated sxs/prior treatment) HPI Comments: Patient is an 78 year old woman who was walking into church today. She felt as though she were going to faint. She sat down but did not black out. There was no pain, just weakness. She had had some associated headache. She feels better. There's been no prior similar episode.  Patient is a 78 y.o. female presenting with syncope. The history is provided by the patient and medical records. No language interpreter was used.  Loss of Consciousness This is a new problem. The current episode started less than 1 hour ago. Episode frequency: One episode while walking into church today. The problem has been resolved. Associated symptoms include headaches. Pertinent negatives include no chest pain, no abdominal pain and no shortness of breath. The symptoms are aggravated by nothing. The symptoms are relieved by nothing. Treatments tried: Brought to [facility] ED via EMS.    Past Medical History  Diagnosis Date  . Dysrhythmia     atrial fibrilliation  . Hypertension     hyperlipidemia-  TEE,CARDIOLYTE STRESS 11/12 with LOV  DR TILLEY AND EKG ON CHART  . Peripheral vascular disease     Hx   DVT x 2  2000  . H/O hiatal hernia   . Neuromuscular disorder     Parkinsons, managed by Dr Anne Hahn-  LOV 11/12  . Bladder infection     06/10/11 with treatment by Dr Timothy Lasso  . Arthritis   . Cancer     2000- breast cancer followed by 6 months of chemo, radiation and  PO meds  . History of DVT (deep vein thrombosis) 06/24/2002    Past Surgical History  Procedure Date  . Breast surgery     LUMPECTOMY  WITH AXILLIARY DISSECTION   2000  . Back surgery     lumbar x 3  ? type  . Tonsillectomy   . Cardiac  catheterization     bilateral cataract extraction with IOL  . Cholecystectomy   . Joint replacement     RIGHT HIP REPLACEMENT   1996  . Total knee arthroplasty 06/23/2011    Procedure: TOTAL KNEE ARTHROPLASTY;  Surgeon: Loanne Drilling;  Location: [facility] ORS;  Service: Orthopedics;  Laterality: Right;    No family history on file.  History  Substance Use Topics  . Smoking status: Never Smoker   . Smokeless tobacco: Never Used  . Alcohol Use: No    OB History    Grav Para Term Preterm Abortions TAB SAB Ect Mult Living                  Review of Systems  Constitutional: Negative.  Negative for fever and chills.  Eyes: Negative.   Respiratory: Negative.  Negative for shortness of breath.   Cardiovascular: Positive for syncope. Negative for chest pain.       Near syncope  Gastrointestinal: Negative for abdominal pain.  Genitourinary:       History of UTI  Musculoskeletal: Positive for back pain.       Recent epidural injection for chronic back pain.    Neurological: Positive for headaches.  Psychiatric/Behavioral: Negative.     Allergies  Review of patient's allergies indicates no known allergies.  Home Medications   Current Outpatient  Rx  Name Route Sig Dispense Refill  . ASPIRIN EC 81 MG PO TBEC Oral Take 81 mg by mouth daily.    Marland Kitchen CALCIUM CARBONATE-VITAMIN D 600-400 MG-UNIT PO CHEW Oral Chew 1 tablet by mouth daily.    Marland Kitchen CARBIDOPA-LEVODOPA 25-100 MG PO TABS Oral Take 0.5 tablets by mouth 2 (two) times daily.    Marland Kitchen CLONAZEPAM 0.5 MG PO TABS Oral Take 0.25 mg by mouth 2 (two) times daily.     . OMEGA-3 FATTY ACIDS 1000 MG PO CAPS Oral Take 1 g by mouth daily.    . FUROSEMIDE 40 MG PO TABS Oral Take 40 mg by mouth daily.    Marland Kitchen HYDROCHLOROTHIAZIDE 25 MG PO TABS Oral Take 25 mg by mouth daily.      . MELOXICAM 15 MG PO TABS Oral Take 15 mg by mouth daily.    . QUETIAPINE FUMARATE 25 MG PO TABS Oral Take 12.5 mg by mouth at bedtime.     Marland Kitchen ROPINIROLE HCL ER 12 MG PO TB24 Oral  Take 1 tablet by mouth at bedtime.     . TRAMADOL HCL 50 MG PO TABS Oral Take 50 mg by mouth every 6 (six) hours as needed. Maximum dose= 8 tablets per day. PAIN       BP 105/62  Pulse 103  Temp(Src) 97.7 F (36.5 C) (Oral)  Resp 20  SpO2 96%  Physical Exam  Nursing note and vitals reviewed. Constitutional: She is oriented to person, place, and time.       Morbidly obese elderly woman, no distress at rest.  HENT:  Head: Normocephalic and atraumatic.  Right Ear: External ear normal.  Left Ear: External ear normal.  Mouth/Throat: Oropharynx is clear and moist.  Eyes: EOM are normal. Pupils are equal, round, and reactive to light.  Neck: Normal range of motion. Neck supple.       No carotid bruit.  Cardiovascular: Normal rate, regular rhythm and normal heart sounds.   Pulmonary/Chest: Effort normal and breath sounds normal.  Abdominal: Soft. Bowel sounds are normal. She exhibits no distension. There is no tenderness.  Musculoskeletal: Normal range of motion. She exhibits no edema.       No calf tenderness, no Homans' sign.  Neurological: She is alert and oriented to person, place, and time.       No sensory or motor deficit.   Skin: Skin is warm and dry.  Psychiatric: She has a normal mood and affect. Her behavior is normal.    ED Course  Procedures (including critical care time)  Labs Reviewed  GLUCOSE, CAPILLARY - Abnormal; Notable for the following:    Glucose-Capillary 110 (*)    All other components within normal limits  CBC - Abnormal; Notable for the following:    RDW 16.2 (*)    All other components within normal limits  COMPREHENSIVE METABOLIC PANEL - Abnormal; Notable for the following:    Glucose, Bld 100 (*)    BUN 35 (*)    Creatinine, Ser 1.16 (*)    GFR calc non Af Amer 43 (*)    GFR calc Af Amer 50 (*)    All other components within normal limits  URINALYSIS, ROUTINE W REFLEX MICROSCOPIC - Abnormal; Notable for the following:    APPearance HAZY (*)      Leukocytes, UA MODERATE (*)    All other components within normal limits  PRO B NATRIURETIC PEPTIDE - Abnormal; Notable for the following:    Pro B Natriuretic  peptide (BNP) 1628.0 (*)    All other components within normal limits  D-DIMER, QUANTITATIVE - Abnormal; Notable for the following:    D-Dimer, Quant 11.31 (*)    All other components within normal limits  URINE MICROSCOPIC-ADD ON - Abnormal; Notable for the following:    Squamous Epithelial / LPF MANY (*)    Bacteria, UA FEW (*)    All other components within normal limits  DIFFERENTIAL  PROTIME-INR  APTT  POCT I-STAT TROPONIN I  URINE CULTURE   Dg Chest Port 1 View  10/10/2011  *RADIOLOGY REPORT*  Clinical Data: Near syncope, tachycardia and weakness.  History of atrial fibrillation.  PORTABLE CHEST - 1 VIEW  Comparison: 06/25/2011  Findings: Stable mild cardiomegaly.  Stable elevation of the left hemidiaphragm with associated atelectasis and also chronic scarring at the left lung base.  No edema or infiltrate.  Stable tortuosity of the thoracic aorta.  IMPRESSION: No active disease.  Stable mild cardiomegaly and chronic elevation of the left hemidiaphragm.  Original Report Authenticated By: Reola Calkins, M.D.   10:55 AM  Date: 10/10/2011  Rate:99  Rhythm: sinus tachycardia  QRS Axis: normal  Intervals: normal  ST/T Wave abnormalities: nonspecific T wave changes  Conduction Disutrbances:none  Narrative Interpretation: Abnormal EKG.  Old EKG Reviewed: changes noted--abnormal Q waves in inferior leads and RBBB have normalized since tracing or 06/26/2011.  4:23 PM Lab workup shows an elevated BNP of 1628.  D-dimer was elevated at 11.31.  CT angio of chest ordered.  Results reviewed with pt and her husband.  Pt resting comfortably, no complaints.  Care transferred to Dr. Manus Gunning at 4:30 P.M.    1. Pulmonary embolism   2. Near syncope       Pt's CT angio of the chest showed a large pulmonary embolus.  Pt's  admission arranged by Dr. Manus Gunning.   Carleene Cooper III, MD 10/10/11 [phone]

## 2011-10-10 NOTE — Progress Notes (Signed)
ANTICOAGULATION CONSULT NOTE - Initial Consult  Pharmacy Consult:  Heparin Indication:  Pulmonary embolism  No Known Allergies  Patient Measurements: Weight = 190 lbs Height = 5'6" Heparin Dosing Weight: 78 kg  Vital Signs: Temp: 97.9 F (36.6 C) (04/07 1731) Temp src: Oral (04/07 1731) BP: 96/82 mmHg (04/07 1731) Pulse Rate: 81  (04/07 1731)  Labs:  Basename 10/10/11 1129  HGB 14.9  HCT 45.3  PLT 165  APTT 24  LABPROT 13.4  INR 1.00  HEPARINUNFRC --  CREATININE 1.16*  CKTOTAL --  CKMB --  TROPONINI --   The CrCl is unknown because both a height and weight (above a minimum accepted value) are required for this calculation.  Medical History: Past Medical History  Diagnosis Date  . Dysrhythmia     atrial fibrilliation  . Hypertension     hyperlipidemia-  TEE,CARDIOLYTE STRESS 11/12 with LOV  DR TILLEY AND EKG ON CHART  . Peripheral vascular disease     Hx   DVT x 2  2000  . H/O hiatal hernia   . Neuromuscular disorder     Parkinsons, managed by Dr Anne Hahn-  LOV 11/12  . Bladder infection     06/10/11 with treatment by Dr Timothy Lasso  . Arthritis   . Cancer     2000- breast cancer followed by 6 months of chemo, radiation and  PO meds  . History of DVT (deep vein thrombosis) 06/24/2002  . Parkinsonism   . Breast cancer       Assessment: 36 YOF with h/o Afib admitted with extensive B/L pulmonary emboli on chest CT.  Pharmacy consulted to dose IV heparin.  Noted heparin 4000 units IV bolus ordered.  CBC WNL; INR at baseline.   Goal of Therapy:  HL 0.3 - 0.7 units/mL    Plan:  - Heparin 4000 units IV bolus as ordered, then - Heparin gtt at 1100 units/hr - Check 8 hr HL - Daily HL / CBC - F/U with order to start Coumadin    Darnell Jeschke D. Laney Potash, PharmD, BCPS Pager:  [phone] 10/10/2011, 5:53 PM

## 2011-10-10 NOTE — ED Notes (Signed)
CBG was 110 when I checked it.10:50am JG.

## 2011-10-10 NOTE — ED Provider Notes (Signed)
I received this patient sent from Dr. Ignacia Palma. She had near syncopal episode in church without chest pain or shortness of breath. She thought she was in atrial fibrillation but is not. She's had tachycardia and hypotension and elevated d-dimer.  CT angio of her chest shows large saddle embolus per Dr. Jena Gauss. She started on heparin bolus and drip. Given tachycardia and hypotension I discussed with Dr. Orson Aloe the thoracic surgeon on call he stated that thrombectomy was not indicated given her clinical stability. Admission discussed with Dr. Timothy Lasso. BP 96/82  Pulse 81  Temp(Src) 97.9 F (36.6 C) (Oral)  Resp 22  Ht 5\' 6"  (1.676 m)  Wt 190 lb (86.183 kg)  BMI 30.67 kg/m2  SpO2 97%   Glynn Octave, MD 10/10/11 1857

## 2011-10-10 NOTE — ED Notes (Signed)
Pt reporting over past few days been feeling weak, not like herself.Today while at church, after pt was dropped off at door by husband pt felt "faint" and sat down. Pt denying any CP or diaphoresis at the time. Pt did not have a syncopal episode. Pt reports ate "big breakfast" this morning. Reporting hx of AFIB in past. Pt denying any CP or SOB at this time. Reporting only complaint currently is "weak feeling". Pt alert, talking, speaking in clear, concise sentences. AFIB seen on monitor with rate of 118. Pt husband at bedside.

## 2011-10-10 NOTE — ED Notes (Signed)
MD at bedside. Admitting dr 

## 2011-10-10 NOTE — ED Notes (Signed)
Patient transported to CT 

## 2011-10-10 NOTE — ED Provider Notes (Deleted)
10:55 AM  Date: 10/10/2011  Rate:99  Rhythm: sinus tachycardia  QRS Axis: normal  Intervals: normal  ST/T Wave abnormalities: nonspecific T wave changes  Conduction Disutrbances:none  Narrative Interpretation: Abnormal EKG.  Old EKG Reviewed: changes noted--abnormal Q waves in inferior leads and RBBB have normalized since tracing or 06/26/2011.    Carleene Cooper III, MD 10/10/11 1057

## 2011-10-11 DIAGNOSIS — I2699 Other pulmonary embolism without acute cor pulmonale: Secondary | ICD-10-CM

## 2011-10-11 LAB — CBC
HCT: 38.3 % (ref 36.0–46.0)
Hemoglobin: 12.6 g/dL (ref 12.0–15.0)
MCV: 91 fL (ref 78.0–100.0)
RBC: 4.21 MIL/uL (ref 3.87–5.11)
WBC: 6.1 10*3/uL (ref 4.0–10.5)

## 2011-10-11 LAB — BASIC METABOLIC PANEL
CO2: 26 mEq/L (ref 19–32)
Chloride: 106 mEq/L (ref 96–112)
Creatinine, Ser: 0.88 mg/dL (ref 0.50–1.10)
Potassium: 4 mEq/L (ref 3.5–5.1)
Sodium: 140 mEq/L (ref 135–145)

## 2011-10-11 LAB — CARDIAC PANEL(CRET KIN+CKTOT+MB+TROPI)
CK, MB: 2.4 ng/mL (ref 0.3–4.0)
CK, MB: 2.4 ng/mL (ref 0.3–4.0)
Troponin I: <0.3 ng/mL (ref <0.30)
Troponin I: <0.3 ng/mL (ref <0.30)

## 2011-10-11 MED ORDER — WARFARIN - PHARMACIST DOSING INPATIENT
Freq: Every day | Status: DC
  Administered 2011-10-13: 17:00:00

## 2011-10-11 MED ORDER — HEPARIN (PORCINE) IN NACL 100-0.45 UNIT/ML-% IJ SOLN
800.0000 [IU]/h | INTRAMUSCULAR | Status: DC
  Administered 2011-10-11 (×2): 900 [IU]/h via INTRAVENOUS
  Administered 2011-10-12 (×2): 800 [IU]/h via INTRAVENOUS
  Filled 2011-10-11 (×4): qty 250

## 2011-10-11 MED ORDER — WARFARIN SODIUM 5 MG PO TABS
5.0000 mg | ORAL_TABLET | Freq: Once | ORAL | Status: AC
  Administered 2011-10-11: 5 mg via ORAL
  Filled 2011-10-11: qty 1

## 2011-10-11 MED ORDER — WARFARIN VIDEO
Freq: Once | Status: AC
  Administered 2011-10-12: 11:00:00

## 2011-10-11 MED ORDER — CLONAZEPAM 0.5 MG PO TABS: 0.2500 mg | ORAL_TABLET | Freq: Two times a day (BID) | ORAL | Status: DC | PRN

## 2011-10-11 MED ORDER — COUMADIN BOOK
Freq: Once | Status: AC
  Administered 2011-10-11: 11:00:00
  Filled 2011-10-11: qty 1

## 2011-10-11 NOTE — Progress Notes (Signed)
Subjective: Admitted last night with Saddle Emboli/Presyncope. No current Sxs. No CP Some Confusion overnight - mentally stable currently Tachy has improved. rxed with IV Hep gtt and IVF  Objective: Vital signs in last 24 hours: Temp:  [97.7 F (36.5 C)-98.7 F (37.1 C)] 98.7 F (37.1 C) (04/08 0341) Pulse Rate:  [53-123] 102  (04/08 0341) Resp:  [16-34] 20  (04/08 0341) BP: (50-129)/(20-103) 118/75 mmHg (04/08 0341) SpO2:  [92 %-97 %] 97 % (04/08 0341) Weight:  [86.183 kg (190 lb)-88.1 kg (194 lb 3.6 oz)] 88.1 kg (194 lb 3.6 oz) (04/08 0341) Weight change:  Last BM Date: 10/10/11  CBG (last 3)   Basename 10/10/11 1047  GLUCAP 110*    Intake/Output from previous day:  Intake/Output Summary (Last 24 hours) at 10/11/11 0981 Last data filed at 10/10/11 1831  Gross per 24 hour  Intake   1000 ml  Output      0 ml  Net   1000 ml   04/07 0701 - 04/08 0700 In: 1000 [I.V.:1000] Out: -    Physical Exam  General appearance: Alert, happy, oriented this am Throat: oropharynx moist without erythema Resp: CTA B CAR:  regular GI: soft, non-tender; bowel sounds normal; no masses,  no organomegaly Extremities: R TKR scar and less edema - minor R calf tender.   Lab Results:  [facility] 10/11/11 0310 10/10/11 1129  NA 140 141  K 4.0 4.1  CL 106 102  CO2 26 27  GLUCOSE 107* 100*  BUN 24* 35*  CREATININE 0.88 1.16*  CALCIUM 8.9 9.9  MG -- --  PHOS -- --     Basename 10/10/11 1129  AST 20  ALT 21  ALKPHOS 73  BILITOT 0.7  PROT 7.0  ALBUMIN 4.0     Basename 10/11/11 0440 10/10/11 1129  WBC 6.1 8.2  NEUTROABS -- 5.7  HGB 12.6 14.9  HCT 38.3 45.3  MCV 91.0 90.8  PLT 145* 165    Lab Results  Component Value Date   INR 1.00 10/10/2011   INR 1.18 07/28/2011   INR 1.77* 06/28/2011     Basename 10/11/11 0442 10/10/11 2248  CKTOTAL 33 35  CKMB 2.4 2.5  CKMBINDEX -- --  TROPONINI <0.30 <0.30    No results found for this basename:  TSH,T4TOTAL,FREET3,T3FREE,THYROIDAB in the last 72 hours  No results found for this basename: VITAMINB12:2,FOLATE:2,FERRITIN:2,TIBC:2,IRON:2,RETICCTPCT:2 in the last 72 hours  Micro Results: No results found for this or any previous visit (from the past 240 hour(s)).   Studies/Results: Ct Angio Chest W/cm &/or Wo Cm  10/10/2011  *RADIOLOGY REPORT*  Clinical Data: There is syncope.  Elevated D-dimer.  Atrial fibrillation.  CT ANGIOGRAPHY CHEST  Technique:  Multidetector CT imaging of the chest using the standard protocol during bolus administration of intravenous contrast. Multiplanar reconstructed images including MIPs were obtained and reviewed to evaluate the vascular anatomy.  Contrast: 60mL OMNIPAQUE IOHEXOL 350 MG/ML SOLN  Comparison: Chest x-ray dated 10/10/2011 and CT angiogram of the chest dated 06/25/2011  Findings: The patient has a subtle pulmonary embolus, larger on the right than the left with occlusion of most of the branch pulmonary arteries on the right proximally.  The embolus in the left pulmonary artery is smaller but extends into the upper and lower lobes.  No infiltrates or or effusions.  Scarring in both lungs is stable. Overall heart size is normal.  No acute osseous abnormality.  IMPRESSION: Extensive bilateral pulmonary emboli.  Critical Value/emergent results were called by telephone  at the time of interpretation on 10/10/2011  at 5:30 p.m.  to  the patient's  ER physician, who verbally acknowledged these results.  Original Report Authenticated By: Gwynn Burly, M.D.   Dg Chest Port 1 View  10/10/2011  *RADIOLOGY REPORT*  Clinical Data: Near syncope, tachycardia and weakness.  History of atrial fibrillation.  PORTABLE CHEST - 1 VIEW  Comparison: 06/25/2011  Findings: Stable mild cardiomegaly.  Stable elevation of the left hemidiaphragm with associated atelectasis and also chronic scarring at the left lung base.  No edema or infiltrate.  Stable tortuosity of the thoracic  aorta.  IMPRESSION: No active disease.  Stable mild cardiomegaly and chronic elevation of the left hemidiaphragm.  Original Report Authenticated By: Reola Calkins, M.D.     Medications: Scheduled:   . sodium chloride   Intravenous STAT  . aspirin EC  81 mg Oral Daily  . calcium-vitamin D  1 tablet Oral Daily  . carbidopa-levodopa  0.5 tablet Oral BID  . clonazePAM  0.25 mg Oral BID  . heparin  4,000 Units Intravenous Once  . omega-3 acid ethyl esters  1 g Oral Daily  . QUEtiapine  12.5 mg Oral QHS  . Ropinirole HCl  1 tablet Oral QHS  . DISCONTD: Calcium Carbonate-Vitamin D  1 tablet Oral Daily  . DISCONTD: fish oil-omega-3 fatty acids  1 g Oral Daily   Continuous:   . sodium chloride 1,000 mL (10/11/11 0625)  . heparin 900 Units/hr (10/11/11 2130)  . DISCONTD: sodium chloride 125 mL/hr at 10/10/11 1149  . DISCONTD: heparin 1,100 Units/hr (10/10/11 1828)     Assessment/Plan: Principal Problem:  *Pulmonary embolism, bilateral Active Problems:  Parkinson disease  Tachycardia  Hypertension   Problem #1 - Saddle Embolus with R Heart Strain, Tachycardia, and near Syncope. - ContTele, O2, IV Heparin.  2 D Echo ordered.  She is HD stable and will be monitored. Thrombectomy not currently indicated.  Will start Coumadin Today. She will need [facility] Rx.   Problem # 2: ESSENTIAL HYPERTENSION, BENIGN (ICD-401.1)  BP is fine.  Restart HCTZ when necessary - off with hydration and pre-syncopal event.    Problem # 3: IMPAIRED FASTING GLUCOSE (ICD-790.21)  Hgb A1C: 5.0  Not DM - follow.    Basename 10/10/11 1047  GLUCAP 110*     Problem # 4: PARKINSON'S DISEASE (ICD-332.0)  Parkinson's disease--Dr Willis/Hallucinations/Rapid eye movement sleep disorder  She remains on Requip/Sinemet/Seroquel 25 HS She reports the nurses told her she was confused last night - Look for worsening sundowning. Use Klonopin prn  Problem # 5: CARDIOMYOPATHY (ICD-425.4)  EF 39- 50% on Echo  prior to Surgery. Will repeat.  She is asymptomatic.  Watch for CHF with fluids.  CK/Trop I (-)  Problem # 6: THYROID STIMULATING HORMONE, ABNORMAL (ICD-246.9)  TSH 4.57- Recheck   Problem # 7: OBESITY (ICD-278.00)  Needs some weight loss which would help her with her LBP, Radiculopathy, OA and other issues.   Problem # 8: ATRIAL FIBRILLATION, PAROXYSMAL (ICD-427.31)  Stay on low Dose BB. Monitor. She is NSR here Last Afib known was 10/2009   Problem # 9: OSTEOARTHRITIS (ICD-715.90)  Osteoarthritis (Dr. Lequita Halt). S/P R TKR 06/23/11 complicated by worsening memory and difficulty getting back to baseline  no current pains. She has L TKR surgery in 10/2011 (on Hold due to fear of post op Confusion/Delerium)  Some LBP despite recent ESI.   10. Urine is a little dirty - check Cx. ??UTI??   11. DVT  Prophylaxis  12. Cr back to nml.  Hbg dropped post IVF - follow  OutPt med list: 1) Carbidopa-levodopa 25-100 Mg Tabs (Carbidopa-levodopa) .... Take 0.5 tablets by mouth twice a day  2) Aspirin 81 Mg Ec Tab (Aspirin) .... Take one (1) tablet by mouth daily  3) Caltrate 600+d Tabs (Calcium carbonate-vitamin d tabs) .... Take one tablet by mouth once daily  4) Clonazepam 0.5 Mg Tabs (Clonazepam) .... Qhs  5) Fish Oil 1000 Mg Caps (Omega-3 fatty acids) .... One tablet by mouth once a day  6) Hydrochlorothiazide 25 Mg Tabs (Hydrochlorothiazide) .Marland Kitchen.. 1 po qd  7) Requip Xl 12 Mg Xr24h-tab (Ropinirole hcl) .Marland Kitchen.. 1 po qhs  8) Mobic 15 Mg Tabs (Meloxicam) .Marland Kitchen.. 1 po qd  9) Seroquel 25 Mg Tabs (Quetiapine fumarate) .Marland Kitchen.. 1 po at bedtime.     LOS: 1 day   Avion Patella M 10/11/2011, 8:08 AM

## 2011-10-11 NOTE — Progress Notes (Addendum)
ANTICOAGULATION CONSULT NOTE - Follow Up  Pharmacy Consult:  Heparin Indication:  Pulmonary embolism  No Known Allergies  Patient Measurements: Weight = 190 lbs Height = 5'6" Heparin Dosing Weight: 78 kg  Vital Signs: Temp: 98.7 F (37.1 C) (04/08 0341) Temp src: Oral (04/08 0341) BP: 118/75 mmHg (04/08 0341) Pulse Rate: 102  (04/08 0341)  Labs:  Basename 10/11/11 0310 10/10/11 2248 10/10/11 1129  HGB -- -- 14.9  HCT -- -- 45.3  PLT -- -- 165  APTT -- -- 24  LABPROT -- -- 13.4  INR -- -- 1.00  HEPARINUNFRC 1.25* -- --  CREATININE -- -- 1.16*  CKTOTAL -- 35 --  CKMB -- 2.5 --  TROPONINI -- <0.30 --   Estimated Creatinine Clearance: 43.2 ml/min (by C-G formula based on Cr of 1.16).  Medical History: Past Medical History  Diagnosis Date  . Dysrhythmia     atrial fibrilliation  . Hypertension     hyperlipidemia-  TEE,CARDIOLYTE STRESS 11/12 with LOV  DR TILLEY AND EKG ON CHART  . Peripheral vascular disease     Hx   DVT x 2  2000  . H/O hiatal hernia   . Neuromuscular disorder     Parkinsons, managed by Dr Anne Hahn-  LOV 11/12  . Bladder infection     06/10/11 with treatment by Dr Timothy Lasso  . Arthritis   . Cancer     2000- breast cancer followed by 6 months of chemo, radiation and  PO meds  . History of DVT (deep vein thrombosis) 06/24/2002  . Parkinsonism   . Breast cancer     Assessment: 5 YOF with h/o Afib admitted with extensive B/L pulmonary emboli on chest CT.  Pharmacy consulted to dose IV heparin.  Confirmed with RN that heparin level (of 1.25) was drawn in close proximity to where heparin was infusing in arm, therefore unable to evaluate heparin dosing based on this lab.   Goal of Therapy:  HL 0.3 - 0.7 units/mL   Plan:  1. Continue IV heparin at 1100 units/hr. .  2. Stat heparin level.   Lorre Munroe, PharmD 10/11/2011, 3:53 AM   Addendum:  Repeat heparin level (1.25) remains high. Confirmed with RN that heparin level drawn appropriately. Will  hold heparin x 1 hour, then restart IV heparin at 900 units/hr. Heparin level 8 hours after restart.   Lorre Munroe, PharmD 10/11/11, [phone].   Addendum:  Warfarin added to regimen this morning. LFT's within normal limits, baseline INR 1.0. Calculated warfarin score of 3. No interacting medications noted on profile.  Plan: Warfarin 5mg  x1 tonight Pm Heparin level Daily INR 8:22 AM 10/11/2011  Sheppard Coil PharmD.

## 2011-10-11 NOTE — Progress Notes (Signed)
VASCULAR LAB PRELIMINARY  PRELIMINARY  PRELIMINARY  PRELIMINARY  Bilateral lower extremity venous Dopplers completed.    Preliminary report:  There is age indeterminate DVT noted in the right posterior tibial vein, coursing through to the popliteal and femoral veins.  The common femoral vein appears thrombus free.  No propagation to the left lower extremity noted.   Sherren Kerns Dawn, 10/11/2011, 10:19 AM

## 2011-10-11 NOTE — Clinical Documentation Improvement (Signed)
CHF DOCUMENTATION CLARIFICATION QUERY  THIS DOCUMENT IS NOT A PERMANENT PART OF THE MEDICAL RECORD  TO RESPOND TO THE THIS QUERY, FOLLOW THE INSTRUCTIONS BELOW:  1. If needed, update documentation for the patient's encounter via the notes activity.  2. Access this query again and click edit on the In [employer].  3. After updating, or not, click F2 to complete all highlighted (required) fields concerning your review. Select "additional documentation in the medical record" OR "no additional documentation provided".  4. Click Sign note button.  5. The deficiency will fall out of your In Basket *Please let us know if you are not able to complete this workflow by phone or e-mail (listed below).  Please update your documentation within the medical record to reflect your response to this query.                                                                                    10/11/11  Dear [facility],  In a better effort to capture your patient's severity of illness, reflect appropriate length of stay and utilization of resources, a review of the patient medical record has revealed the following indicators the diagnosis of Heart Failure.    Based on your clinical judgment, please clarify and document in a progress note and/or discharge summary the clinical condition associated with the following supporting information:  In responding to this query please exercise your independent judgment.  The fact that a query is asked, does not imply that any particular answer is desired or expected.  Possible Clinical Conditions?  Chronic Systolic Congestive Heart Failure Chronic Diastolic Congestive Heart Failure Chronic Systolic & Diastolic Congestive Heart Failure Acute Systolic Congestive Heart Failure Acute Diastolic Congestive Heart Failure Acute Systolic & Diastolic Congestive Heart Failure Acute on Chronic Systolic Congestive Heart Failure Acute on Chronic Diastolic Congestive  Heart Failure Acute on Chronic Systolic & Diastolic  Congestive Heart Failure Other Condition________________________________________ Cannot Clinically Determine  Supporting Information:  Risk Factors: Watch for CHF with IV fluids.  Diagnostics: 4/7:  proBNP: 1628.0  Reviewed: No additional documentation provided.                           Thank You,  Marciano Sequin,  Clinical Documentation Specialist:  Pager: [phone]  Health Information Management [place]

## 2011-10-11 NOTE — Progress Notes (Signed)
ANTICOAGULATION CONSULT NOTE - Follow Up Consult  Pharmacy Consult for Heparin Indication: pulmonary embolus  No Known Allergies  Patient Measurements: Height: 5\' 6"  (167.6 cm) Weight: 194 lb 3.6 oz (88.1 kg) IBW/kg (Calculated) : 59.3  Heparin Dosing Weight: 78 kg  Vital Signs: Temp: 98.3 F (36.8 C) (04/08 1354) Temp src: Oral (04/08 1354) BP: 103/68 mmHg (04/08 1354) Pulse Rate: 101  (04/08 1354)  Labs:  Basename 10/11/11 1610 10/11/11 0442 10/11/11 0440 10/11/11 0310 10/10/11 2248 10/10/11 1129  HGB -- -- 12.6 -- -- 14.9  HCT -- -- 38.3 -- -- 45.3  PLT -- -- 145* -- -- 165  APTT -- -- -- -- -- 24  LABPROT -- -- -- -- -- 13.4  INR -- -- -- -- -- 1.00  HEPARINUNFRC 0.61 1.25* -- 1.25* -- --  CREATININE -- -- -- 0.88 -- 1.16*  CKTOTAL -- 33 -- -- 35 --  CKMB -- 2.4 -- -- 2.5 --  TROPONINI -- <0.30 -- -- <0.30 --   Estimated Creatinine Clearance: 57 ml/min (by C-G formula based on Cr of 0.88).   Medications:  Infusions:    . sodium chloride 50 mL/hr at 10/11/11 1249  . heparin 900 Units/hr (10/11/11 1249)  . DISCONTD: sodium chloride 125 mL/hr at 10/10/11 1149  . DISCONTD: heparin 1,100 Units/hr (10/10/11 1828)    Assessment: 78 yo F on heparin for extensive BL pulmonary emboli on chest CT.  Heparin level this AM was supra therapeutic and dose was reduced.  Heparin level now within goal range on 900 units/hr.  Goal of Therapy:  Heparin level 0.3-0.7 units/ml   Plan:  Continue Heparin at 900 units/hr. Follow-up with AM labs and adjust as needed.  [employer], Pharm.D., BCPS Clinical Pharmacist Pager [phone] 10/11/2011 5:10 PM

## 2011-10-12 LAB — CBC
HCT: 41 % (ref 36.0–46.0)
Hemoglobin: 13.3 g/dL (ref 12.0–15.0)
MCH: 29.7 pg (ref 26.0–34.0)
MCHC: 32.4 g/dL (ref 30.0–36.0)
RBC: 4.48 MIL/uL (ref 3.87–5.11)

## 2011-10-12 LAB — HEPARIN LEVEL (UNFRACTIONATED): Heparin Unfractionated: 0.67 IU/mL (ref 0.30–0.70)

## 2011-10-12 LAB — PROTIME-INR
INR: 1.09 (ref 0.00–1.49)
Prothrombin Time: 14.3 seconds (ref 11.6–15.2)

## 2011-10-12 LAB — URINE CULTURE: Colony Count: 80000

## 2011-10-12 MED ORDER — ACETAMINOPHEN 325 MG PO TABS
650.0000 mg | ORAL_TABLET | ORAL | Status: DC | PRN
  Administered 2011-10-12 – 2011-10-16 (×4): 650 mg via ORAL
  Filled 2011-10-12 (×4): qty 2

## 2011-10-12 MED ORDER — WARFARIN SODIUM 5 MG PO TABS
5.0000 mg | ORAL_TABLET | Freq: Once | ORAL | Status: AC
  Administered 2011-10-12: 5 mg via ORAL
  Filled 2011-10-12: qty 1

## 2011-10-12 NOTE — Progress Notes (Addendum)
Subjective: Admitted with Saddle Emboli/Presyncope. No current Sxs. No CP Some Confusion overnight 2 nights ago and less now but still present. Tachy has improved a little. rxed with IV Hep gtt, IVF, and oral coumadin  Objective: Vital signs in last 24 hours: Temp:  [98.2 F (36.8 C)-98.3 F (36.8 C)] 98.2 F (36.8 C) (04/09 0442) Pulse Rate:  [54-101] 54  (04/09 0442) Resp:  [20] 20  (04/09 0442) BP: (101-119)/(61-82) 101/61 mmHg (04/09 0442) SpO2:  [93 %-97 %] 97 % (04/09 0442) Weight:  [87.8 kg (193 lb 9 oz)] 87.8 kg (193 lb 9 oz) (04/09 0442) Weight change: 1.617 kg (3 lb 9 oz) Last BM Date: 10/11/11  CBG (last 3)   Basename 10/10/11 1047  GLUCAP 110*    Intake/Output from previous day:  Intake/Output Summary (Last 24 hours) at 10/12/11 1011 Last data filed at 10/12/11 0810  Gross per 24 hour  Intake      0 ml  Output   1200 ml  Net  -1200 ml   04/08 0701 - 04/09 0700 In: 240 [P.O.:240] Out: 600 [Urine:600]   Physical Exam  General appearance: Alert, happy, oriented this am.  Husband in room and I answered all ?Marland Kitchen Throat: oropharynx moist without erythema Resp: CTA B CAR:  regular GI: soft, non-tender; bowel sounds normal; no masses,  no organomegaly Extremities: R TKR scar and less edema - minor R calf tender.   Lab Results:  [facility] 10/11/11 0310 10/10/11 1129  NA 140 141  K 4.0 4.1  CL 106 102  CO2 26 27  GLUCOSE 107* 100*  BUN 24* 35*  CREATININE 0.88 1.16*  CALCIUM 8.9 9.9  MG -- --  PHOS -- --     Basename 10/10/11 1129  AST 20  ALT 21  ALKPHOS 73  BILITOT 0.7  PROT 7.0  ALBUMIN 4.0     Basename 10/12/11 0545 10/11/11 0440 10/10/11 1129  WBC 6.5 6.1 --  NEUTROABS -- -- 5.7  HGB 13.3 12.6 --  HCT 41.0 38.3 --  MCV 91.5 91.0 --  PLT 132* 145* --    Lab Results  Component Value Date   INR 1.09 10/12/2011   INR 1.00 10/10/2011   INR 1.18 07/28/2011     Basename 10/11/11 1611 10/11/11 0442 10/10/11 2248  CKTOTAL 43 33 35    CKMB 2.4 2.4 2.5  CKMBINDEX -- -- --  TROPONINI <0.30 <0.30 <0.30     Basename 10/11/11 0440  TSH 1.311  T4TOTAL --  T3FREE --  THYROIDAB --    No results found for this basename: VITAMINB12:2,FOLATE:2,FERRITIN:2,TIBC:2,IRON:2,RETICCTPCT:2 in the last 72 hours  Micro Results: Recent Results (from the past 240 hour(s))  URINE CULTURE     Status: Normal (Preliminary result)   Collection Time   10/10/11 12:51 PM      Component Value Range Status Comment   Specimen Description URINE, RANDOM   Final    Special Requests NONE   Final    Culture  Setup Time [id number]   Final    Colony Count PENDING   Incomplete    Culture Culture reincubated for better growth   Final    Report Status PENDING   Incomplete      Studies/Results: Ct Angio Chest W/cm &/or Wo Cm  10/10/2011  *RADIOLOGY REPORT*  Clinical Data: There is syncope.  Elevated D-dimer.  Atrial fibrillation.  CT ANGIOGRAPHY CHEST  Technique:  Multidetector CT imaging of the chest using the standard protocol during bolus administration  of intravenous contrast. Multiplanar reconstructed images including MIPs were obtained and reviewed to evaluate the vascular anatomy.  Contrast: 60mL OMNIPAQUE IOHEXOL 350 MG/ML SOLN  Comparison: Chest x-ray dated 10/10/2011 and CT angiogram of the chest dated 06/25/2011  Findings: The patient has a subtle pulmonary embolus, larger on the right than the left with occlusion of most of the branch pulmonary arteries on the right proximally.  The embolus in the left pulmonary artery is smaller but extends into the upper and lower lobes.  No infiltrates or or effusions.  Scarring in both lungs is stable. Overall heart size is normal.  No acute osseous abnormality.  IMPRESSION: Extensive bilateral pulmonary emboli.  Critical Value/emergent results were called by telephone at the time of interpretation on 10/10/2011  at 5:30 p.Greene.  to  the patient's  ER physician, who verbally acknowledged these results.  Original  Report Authenticated By: Gwynn Burly, Greene.D.   Dg Chest Port 1 View  10/10/2011  *RADIOLOGY REPORT*  Clinical Data: Near syncope, tachycardia and weakness.  History of atrial fibrillation.  PORTABLE CHEST - 1 VIEW  Comparison: 06/25/2011  Findings: Stable mild cardiomegaly.  Stable elevation of the left hemidiaphragm with associated atelectasis and also chronic scarring at the left lung base.  No edema or infiltrate.  Stable tortuosity of the thoracic aorta.  IMPRESSION: No active disease.  Stable mild cardiomegaly and chronic elevation of the left hemidiaphragm.  Original Report Authenticated By: Reola Calkins, Greene.D.     Medications: Scheduled:    . aspirin EC  81 mg Oral Daily  . calcium-vitamin D  1 tablet Oral Daily  . carbidopa-levodopa  0.5 tablet Oral BID  . coumadin book   Does not apply Once  . omega-3 acid ethyl esters  1 g Oral Daily  . QUEtiapine  12.5 mg Oral QHS  . Ropinirole HCl  1 tablet Oral QHS  . warfarin  5 mg Oral ONCE-1800  . warfarin  5 mg Oral ONCE-1800  . warfarin   Does not apply Once  . Warfarin - Pharmacist Dosing Inpatient   Does not apply q1800   Continuous:    . sodium chloride 50 mL/hr at 10/12/11 0840  . heparin 800 Units/hr (10/12/11 0930)     Assessment/Plan: Principal Problem:  *Pulmonary embolism, bilateral Active Problems:  Parkinson disease  Tachycardia  Hypertension   Problem #1 - Saddle Embolus with R Heart Strain, Tachycardia, and near Syncope. - Cont Tele, O2, IV Heparin/Coumadin - goal INR 2-3.  2 D Echo done: Study Conclusions  - Left ventricle: The cavity size was normal. Systolic function was mildly reduced. The estimated ejection fraction was in the range of 45% to 50%. Mild diffuse hypokinesis. - Mitral valve: Moderately calcified annulus. Moderate regurgitation. - Left atrium: The atrium was mildly to moderately dilated. - Right ventricle: The cavity size was dilated. Wall thickness was normal. - Right atrium: The  atrium was mildly dilated. - Tricuspid valve: Moderate regurgitation. - Pulmonary arteries: Systolic pressure was mildly to moderately increased. PA peak pressure: 42mm Hg (S).    She is HD stable and will continue to be monitored. Thrombectomy not currently indicated.  She will need Lifelong Rx.  LE Dopplers showed indeterminate DVT noted in the right posterior tibial vein, coursing through to the popliteal and femoral veins. The common femoral vein appears thrombus free. No propagation to the left lower extremity noted.   She will probably need 5 days overlap Hep/Coumadin and D/C when INR 2-3.  Will  consider lovenox and early discharge if her MS/Sundowning becomes an issue   Problem # 2: ESSENTIAL HYPERTENSION, BENIGN (ICD-401.1)  BP is fine.  Restart HCTZ when necessary - off with hydration and pre-syncopal event.    Problem # 43 PARKINSON'S DISEASE (ICD-332.0)  Parkinson's disease--Dr Willis/Hallucinations/Rapid eye movement sleep disorder  She remains on Requip/Sinemet/Seroquel 25 HS Look for worsening sundowning. Use Klonopin prn  Problem # 5: CARDIOMYOPATHY (ICD-425.4) - EF stable on current ECHO.  She is asymptomatic.  Watch for CHF with fluids.  CK/Trop I (-)  Problem # 6: THYROID STIMULATING HORMONE, ABNORMAL (ICD-246.9) - 1.311 on recheck   7. Await Urine Cx. ??UTI??   8. DVT Prophylaxis  9. Cr back to nml.  Hbg dropped post IVF and currently remains stable.     LOS: 2 days   Angel Greene 10/12/2011, 10:11 AM   I went back and reviewed tele closer - she is in Afbib - rate controlled - will follow - consider BB/Ca channel blocker if needed.Marland Kitchen

## 2011-10-12 NOTE — Progress Notes (Signed)
ANTICOAGULATION CONSULT NOTE - Follow Up  Pharmacy Consult:  Heparin Indication:  Pulmonary embolism  No Known Allergies  Patient Measurements: Weight = 190 lbs Height = 5'6" Heparin Dosing Weight: 78 kg  Vital Signs: Temp: 98.2 F (36.8 C) (04/09 0442) Temp src: Oral (04/09 0442) BP: 101/61 mmHg (04/09 0442) Pulse Rate: 54  (04/09 0442)  Labs:  [facility] Philips 10/12/11 1610 10/12/11 0545 10/11/11 1611 10/11/11 1610 10/11/11 0442 10/11/11 0440 10/11/11 0310 10/10/11 2248 10/10/11 1129  HGB -- 13.3 -- -- -- 12.6 -- -- --  HCT -- 41.0 -- -- -- 38.3 -- -- 45.3  PLT -- 132* -- -- -- 145* -- -- 165  APTT -- -- -- -- -- -- -- -- 24  LABPROT 14.3 -- -- -- -- -- -- -- 13.4  INR 1.09 -- -- -- -- -- -- -- 1.00  HEPARINUNFRC 0.67 -- -- 0.61 1.25* -- -- -- --  CREATININE -- -- -- -- -- -- 0.88 -- 1.16*  CKTOTAL -- -- 43 -- 33 -- -- 35 --  CKMB -- -- 2.4 -- 2.4 -- -- 2.5 --  TROPONINI -- -- <0.30 -- <0.30 -- -- <0.30 --   Estimated Creatinine Clearance: 56.9 ml/min (by C-G formula based on Cr of 0.88).  Medical History: Past Medical History  Diagnosis Date  . Dysrhythmia     atrial fibrilliation  . Hypertension     hyperlipidemia-  TEE,CARDIOLYTE STRESS 11/12 with LOV  DR TILLEY AND EKG ON CHART  . Peripheral vascular disease     Hx   DVT x 2  2000  . H/O hiatal hernia   . Neuromuscular disorder     Parkinsons, managed by Dr Anne Hahn-  LOV 11/12  . Bladder infection     06/10/11 with treatment by Dr Timothy Lasso  . Arthritis   . Cancer     2000- breast cancer followed by 6 months of chemo, radiation and  PO meds  . History of DVT (deep vein thrombosis) 06/24/2002  . Parkinsonism   . Breast cancer     Assessment: 4 YOF with h/o Afib admitted with extensive B/L pulmonary emboli on chest CT. Indeterminate age of DVT in right posterior tibial vein. Patient is currently on day 2/5 recommended VTE overlap with heparin/warfarin. INR starting to trend upward and heparin level at upper end of  goal with question of accumulation. No bleeding issues noted, will empirically decrease heparin rate.  Goal of Therapy:  HL 0.3 - 0.7 units/mL   Plan: 1)Repeat Warfarin 5mg  x1 tonight 2)Decrease IV heparin to 800 units/hr 3)Daily INR/HL  8:57 AM 10/12/2011  Sheppard Coil PharmD.

## 2011-10-13 LAB — URINE CULTURE: Culture  Setup Time: 201304071725

## 2011-10-13 LAB — CBC
Hemoglobin: 13.5 g/dL (ref 12.0–15.0)
MCH: 29.7 pg (ref 26.0–34.0)
RBC: 4.55 MIL/uL (ref 3.87–5.11)
RDW: 16.5 % — ABNORMAL HIGH (ref 11.5–15.5)
WBC: 6.4 10*3/uL (ref 4.0–10.5)

## 2011-10-13 LAB — PROTIME-INR: INR: 1.13 (ref 0.00–1.49)

## 2011-10-13 MED ORDER — WARFARIN SODIUM 7.5 MG PO TABS
7.5000 mg | ORAL_TABLET | Freq: Once | ORAL | Status: AC
  Administered 2011-10-13: 7.5 mg via ORAL
  Filled 2011-10-13 (×2): qty 1

## 2011-10-13 MED ORDER — HEPARIN (PORCINE) IN NACL 100-0.45 UNIT/ML-% IJ SOLN
700.0000 [IU]/h | INTRAMUSCULAR | Status: DC
  Administered 2011-10-13 – 2011-10-14 (×2): 700 [IU]/h via INTRAVENOUS
  Filled 2011-10-13 (×2): qty 250

## 2011-10-13 NOTE — Progress Notes (Signed)
ANTICOAGULATION CONSULT NOTE - Follow Up Consult  Pharmacy Consult for Heparin Indication: pulmonary embolus  No Known Allergies  Patient Measurements: Height: 5\' 6"  (167.6 cm) Weight: 190 lb 1.6 oz (86.229 kg) IBW/kg (Calculated) : 59.3  Heparin Dosing Weight: 78 kg  Vital Signs: Temp: 97.3 F (36.3 C) (04/10 0509) Temp src: Oral (04/10 0509) BP: 109/55 mmHg (04/10 0509) Pulse Rate: 88  (04/10 0509)  Labs:  Alvira Philips 10/13/11 0535 10/12/11 1610 10/12/11 0545 10/11/11 1611 10/11/11 1610 10/11/11 0442 10/11/11 0440 10/11/11 0310 10/10/11 2248 10/10/11 1129  HGB 13.5 -- 13.3 -- -- -- -- -- -- --  HCT 42.6 -- 41.0 -- -- -- 38.3 -- -- --  PLT 142* -- 132* -- -- -- 145* -- -- --  APTT -- -- -- -- -- -- -- -- -- 24  LABPROT 14.7 14.3 -- -- -- -- -- -- -- 13.4  INR 1.13 1.09 -- -- -- -- -- -- -- 1.00  HEPARINUNFRC 0.70 0.67 -- -- 0.61 -- -- -- -- --  CREATININE -- -- -- -- -- -- -- 0.88 -- 1.16*  CKTOTAL -- -- -- 43 -- 33 -- -- 35 --  CKMB -- -- -- 2.4 -- 2.4 -- -- 2.5 --  TROPONINI -- -- -- <0.30 -- <0.30 -- -- <0.30 --   Estimated Creatinine Clearance: 56.4 ml/min (by C-G formula based on Cr of 0.88).   Medications:  Scheduled:    . aspirin EC  81 mg Oral Daily  . calcium-vitamin D  1 tablet Oral Daily  . carbidopa-levodopa  0.5 tablet Oral BID  . omega-3 acid ethyl esters  1 g Oral Daily  . QUEtiapine  12.5 mg Oral QHS  . Ropinirole HCl  1 tablet Oral QHS  . warfarin  5 mg Oral ONCE-1800  . warfarin   Does not apply Once  . Warfarin - Pharmacist Dosing Inpatient   Does not apply q1800    Assessment: 70 YOF with h/o Afib admitted with extensive B/L pulmonary emboli on chest CT. Indeterminate age of DVT in right posterior tibial vein. Patient is currently on day 3/5 recommended VTE overlap with heparin/warfarin. INR starting to trend upward and heparin level at upper end of goal with question of accumulation. No bleeding issues noted, will empirically decrease heparin  rate.  Goal of Therapy:  Heparin level 0.3-0.7 units/ml   Plan:  1) Increase to Warfarin 7.5 mg x 1 tonight 2) Decrease IV heparin to 700 units/hr 3) Daily INR/HL  Stacey L Maness Pharm D Candidate 10/13/2011,9:10 AM

## 2011-10-13 NOTE — Progress Notes (Signed)
Subjective: Admitted with Saddle Emboli/Presyncope.  Afib rate controlled.  Worsening confusion from PD - stable on meds and reorientation..  I woke her up from an uncomfortable position in bed and was initially confused - she reoriented quickly. No new complaints.  Objective: Vital signs in last 24 hours: Temp:  [97.3 F (36.3 C)-98.5 F (36.9 C)] 97.3 F (36.3 C) (04/10 0509) Pulse Rate:  [88-93] 88  (04/10 0509) Resp:  [18] 18  (04/10 0509) BP: (104-129)/(55-81) 109/55 mmHg (04/10 0509) SpO2:  [95 %-98 %] 98 % (04/10 0509) Weight:  [86.229 kg (190 lb 1.6 oz)] 86.229 kg (190 lb 1.6 oz) (04/10 0526) Weight change: -1.571 kg (-3 lb 7.4 oz) Last BM Date: 10/11/11  Intake/Output from previous day:  Intake/Output Summary (Last 24 hours) at 10/13/11 0754 Last data filed at 10/12/11 1700  Gross per 24 hour  Intake      0 ml  Output   1300 ml  Net  -1300 ml   04/09 0701 - 04/10 0700 In: -  Out: 1300 [Urine:1300]   Physical Exam  General appearance: Alert, happy, oriented this am.  Husband in room and I answered all ?Marland Kitchen Throat: oropharynx moist without erythema Resp: CTA B CAR:  Irregular. HR fine. GI: soft, non-tender; bowel sounds normal; no masses,  no organomegaly Extremities: R TKR scar and less edema - minor R calf tender.   Lab Results:  [facility] 10/11/11 0310 10/10/11 1129  NA 140 141  K 4.0 4.1  CL 106 102  CO2 26 27  GLUCOSE 107* 100*  BUN 24* 35*  CREATININE 0.88 1.16*  CALCIUM 8.9 9.9  MG -- --  PHOS -- --     Basename 10/10/11 1129  AST 20  ALT 21  ALKPHOS 73  BILITOT 0.7  PROT 7.0  ALBUMIN 4.0     Basename 10/13/11 0535 10/12/11 0545 10/10/11 1129  WBC 6.4 6.5 --  NEUTROABS -- -- 5.7  HGB 13.5 13.3 --  HCT 42.6 41.0 --  MCV 93.6 91.5 --  PLT 142* 132* --    Lab Results  Component Value Date   INR 1.13 10/13/2011   INR 1.09 10/12/2011   INR 1.00 10/10/2011     Basename 10/11/11 1611 10/11/11 0442 10/10/11 2248  CKTOTAL 43 33 35    CKMB 2.4 2.4 2.5  CKMBINDEX -- -- --  TROPONINI <0.30 <0.30 <0.30     Basename 10/11/11 0440  TSH 1.311  T4TOTAL --  T3FREE --  THYROIDAB --    No results found for this basename: VITAMINB12:2,FOLATE:2,FERRITIN:2,TIBC:2,IRON:2,RETICCTPCT:2 in the last 72 hours  Micro Results: Recent Results (from the past 240 hour(s))  URINE CULTURE     Status: Normal   Collection Time   10/10/11 12:51 PM      Component Value Range Status Comment   Specimen Description URINE, RANDOM   Final    Special Requests NONE   Final    Culture  Setup Time [id number]   Final    Colony Count >=100,000 COLONIES/ML   Final    Culture     Final    Value: STAPHYLOCOCCUS SPECIES (COAGULASE NEGATIVE)     Note: RIFAMPIN AND GENTAMICIN SHOULD NOT BE USED AS SINGLE DRUGS FOR TREATMENT OF STAPH INFECTIONS.   Report Status 10/13/2011 FINAL   Final    Organism ID, Bacteria STAPHYLOCOCCUS SPECIES (COAGULASE NEGATIVE)   Final   URINE CULTURE     Status: Normal   Collection Time   10/11/11  6:18  PM      Component Value Range Status Comment   Specimen Description URINE, RANDOM   Final    Special Requests NONE   Final    Culture  Setup Time [id number]   Final    Colony Count 80,000 COLONIES/ML   Final    Culture     Final    Value: Multiple bacterial morphotypes present, none predominant. Suggest appropriate recollection if clinically indicated.   Report Status 10/12/2011 FINAL   Final      Studies/Results: No results found.   Medications: Scheduled:    . aspirin EC  81 mg Oral Daily  . calcium-vitamin D  1 tablet Oral Daily  . carbidopa-levodopa  0.5 tablet Oral BID  . omega-3 acid ethyl esters  1 g Oral Daily  . QUEtiapine  12.5 mg Oral QHS  . Ropinirole HCl  1 tablet Oral QHS  . warfarin  5 mg Oral ONCE-1800  . warfarin   Does not apply Once  . Warfarin - Pharmacist Dosing Inpatient   Does not apply q1800   Continuous:    . sodium chloride 50 mL/hr at 10/12/11 0840  . heparin 800 Units/hr  (10/12/11 1526)     Assessment/Plan: Principal Problem:  *Pulmonary embolism, bilateral Active Problems:  Parkinson disease  Tachycardia  Hypertension   Problem #1 - Saddle Embolus with R Heart Strain, Tachycardia, and near Syncope. - Cont Tele, O2, IV Heparin/Coumadin - goal INR 2-3.  2 D Echo done: Study Conclusions  - Left ventricle: The cavity size was normal. Systolic function was mildly reduced. The estimated ejection fraction was in the range of 45% to 50%. Mild diffuse hypokinesis. - Mitral valve: Moderately calcified annulus. Moderate regurgitation. - Left atrium: The atrium was mildly to moderately dilated. - Right ventricle: The cavity size was dilated. Wall thickness was normal. - Right atrium: The atrium was mildly dilated. - Tricuspid valve: Moderate regurgitation. - Pulmonary arteries: Systolic pressure was mildly to moderately increased. PA peak pressure: 42mm Hg (S).    She is HD stable and will continue to be monitored. Thrombectomy not currently indicated.  She will need Lifelong Coumadin Rx.  LE Dopplers showed indeterminate DVT noted in the right posterior tibial vein, coursing through to the popliteal and femoral veins. The common femoral vein appears thrombus free. No propagation to the left lower extremity noted.   She will probably need 5 days overlap Hep/Coumadin and D/C when INR 2-3.  Will consider lovenox and early discharge if her MS/Sundowning becomes an issue   Will hold on IVC filter.   Problem # 2: ESSENTIAL HYPERTENSION, BENIGN (ICD-401.1)  BP remains fine.  Restart HCTZ when necessary - off with hydration and pre-syncopal event.    Problem # 3 PARKINSON'S DISEASE (ICD-332.0) -remains on Requip/Sinemet/Seroquel 25 HS Look for worsening sundowning. Use Klonopin prn  Problem # 4: CARDIOMYOPATHY (ICD-425.4) - EF stable on current ECHO.  She is asymptomatic.  Watch for CHF with fluids.   5.  Urine Cx negative for UTI  6. DVT  Prophylaxis  7. Afib - rate controlled and anticoagulated.  Not on BB/Ca channel Blocker or Dig yet - has not needed it.  Monitor and adjust prn.  8 - will get PT eval and Rx to get her up moving.    LOS: 3 days   Kinze Labo M 10/13/2011, 7:54 AM

## 2011-10-13 NOTE — Progress Notes (Signed)
Agree with assessment/plan as stated above by pharmacy student.  Reece Leader, Pharm D 10/13/2011 9:45 AM

## 2011-10-14 LAB — CBC
HCT: 43.7 % (ref 36.0–46.0)
Hemoglobin: 14.2 g/dL (ref 12.0–15.0)
MCH: 29.6 pg (ref 26.0–34.0)
MCHC: 32.5 g/dL (ref 30.0–36.0)

## 2011-10-14 MED ORDER — HEPARIN (PORCINE) IN NACL 100-0.45 UNIT/ML-% IJ SOLN
750.0000 [IU]/h | INTRAMUSCULAR | Status: DC
  Administered 2011-10-14 – 2011-10-17 (×3): 750 [IU]/h via INTRAVENOUS
  Filled 2011-10-14 (×3): qty 250

## 2011-10-14 MED ORDER — WARFARIN SODIUM 7.5 MG PO TABS
7.5000 mg | ORAL_TABLET | Freq: Once | ORAL | Status: AC
  Administered 2011-10-14: 7.5 mg via ORAL
  Filled 2011-10-14: qty 1

## 2011-10-14 NOTE — Progress Notes (Signed)
Subjective: Admitted with Saddle Emboli/Presyncope.  Afib rate controlled.  Worsening confusion from PD - stable on meds and reorientation..  She is comfortable. No new complaints. Some leg cramps.  Objective: Vital signs in last 24 hours: Temp:  [97.5 F (36.4 C)-98.4 F (36.9 C)] 97.5 F (36.4 C) (04/11 0500) Pulse Rate:  [96-100] 99  (04/11 0500) Resp:  [18-20] 20  (04/11 0500) BP: (106-133)/(62-85) 133/85 mmHg (04/11 0500) SpO2:  [92 %-93 %] 93 % (04/11 0500) Weight:  [86.5 kg (190 lb 11.2 oz)] 86.5 kg (190 lb 11.2 oz) (04/11 0500) Weight change: 0.271 kg (9.6 oz) Last BM Date: 10/12/11  Intake/Output from previous day:  Intake/Output Summary (Last 24 hours) at 10/14/11 0814 Last data filed at 10/14/11 0140  Gross per 24 hour  Intake    431 ml  Output    650 ml  Net   -219 ml   04/10 0701 - 04/11 0700 In: 671 [P.O.:540; I.V.:131] Out: 650 [Urine:650]   Physical Exam  General appearance: Alert, happy, oriented this am.  Throat: oropharynx moist without erythema Resp: CTA B CAR:  Irregular. HR fine. GI: soft, non-tender; bowel sounds normal; no masses,  no organomegaly Extremities: R TKR scar and less edema - minor R calf tender.   Lab Results: No results found for this basename: NA:2,K:2,CL:2,CO2:2,GLUCOSE:2,BUN:2,CREATININE:2,CALCIUM:2,MG:2,PHOS:2 in the last 72 hours  No results found for this basename: AST:2,ALT:2,ALKPHOS:2,BILITOT:2,PROT:2,ALBUMIN:2 in the last 72 hours   Basename 10/14/11 0517 10/13/11 0535  WBC 7.5 6.4  NEUTROABS -- --  HGB 14.2 13.5  HCT 43.7 42.6  MCV 91.2 93.6  PLT 148* 142*    Lab Results  Component Value Date   INR 1.31 10/14/2011   INR 1.13 10/13/2011   INR 1.09 10/12/2011     Basename 10/11/11 1611  CKTOTAL 43  CKMB 2.4  CKMBINDEX --  TROPONINI <0.30    No results found for this basename: TSH,T4TOTAL,FREET3,T3FREE,THYROIDAB in the last 72 hours  No results found for this basename:  VITAMINB12:2,FOLATE:2,FERRITIN:2,TIBC:2,IRON:2,RETICCTPCT:2 in the last 72 hours  Micro Results: Recent Results (from the past 240 hour(s))  URINE CULTURE     Status: Normal   Collection Time   10/10/11 12:51 PM      Component Value Range Status Comment   Specimen Description URINE, RANDOM   Final    Special Requests NONE   Final    Culture  Setup Time [id number]   Final    Colony Count >=100,000 COLONIES/ML   Final    Culture     Final    Value: STAPHYLOCOCCUS SPECIES (COAGULASE NEGATIVE)     Note: RIFAMPIN AND GENTAMICIN SHOULD NOT BE USED AS SINGLE DRUGS FOR TREATMENT OF STAPH INFECTIONS.   Report Status 10/13/2011 FINAL   Final    Organism ID, Bacteria STAPHYLOCOCCUS SPECIES (COAGULASE NEGATIVE)   Final   URINE CULTURE     Status: Normal   Collection Time   10/11/11  6:18 PM      Component Value Range Status Comment   Specimen Description URINE, RANDOM   Final    Special Requests NONE   Final    Culture  Setup Time [id number]   Final    Colony Count 80,000 COLONIES/ML   Final    Culture     Final    Value: Multiple bacterial morphotypes present, none predominant. Suggest appropriate recollection if clinically indicated.   Report Status 10/12/2011 FINAL   Final      Studies/Results: No results found.   Medications:  Scheduled:    . aspirin EC  81 mg Oral Daily  . calcium-vitamin D  1 tablet Oral Daily  . carbidopa-levodopa  0.5 tablet Oral BID  . omega-3 acid ethyl esters  1 g Oral Daily  . QUEtiapine  12.5 mg Oral QHS  . Ropinirole HCl  1 tablet Oral QHS  . warfarin  7.5 mg Oral ONCE-1800  . Warfarin - Pharmacist Dosing Inpatient   Does not apply q1800   Continuous:    . sodium chloride 10 mL/hr (10/13/11 1006)  . heparin 700 Units/hr (10/14/11 0136)  . DISCONTD: heparin 800 Units/hr (10/12/11 1526)     Assessment/Plan: Principal Problem:  *Pulmonary embolism, bilateral Active Problems:  Parkinson disease  Tachycardia  Hypertension   Problem #1 -  Saddle Embolus with R Heart Strain, Tachycardia, and near Syncope. - Cont Tele, D/C O2, IV Heparin/Coumadin - goal INR 2-3.  2 D Echo done: Study Conclusions  - Left ventricle: The cavity size was normal. Systolic function was mildly reduced. The estimated ejection fraction was in the range of 45% to 50%. Mild diffuse hypokinesis. - Mitral valve: Moderately calcified annulus. Moderate regurgitation. - Left atrium: The atrium was mildly to moderately dilated. - Right ventricle: The cavity size was dilated. Wall thickness was normal. - Right atrium: The atrium was mildly dilated. - Tricuspid valve: Moderate regurgitation. - Pulmonary arteries: Systolic pressure was mildly to moderately increased. PA peak pressure: 42mm Hg (S).    She is HD stable and will continue to be monitored. Thrombectomy not currently indicated.  She will need Lifelong Coumadin Rx.  LE Dopplers showed indeterminate DVT noted in the right posterior tibial vein, coursing through to the popliteal and femoral veins. The common femoral vein appears thrombus free. No propagation to the left lower extremity noted.   She will probably need 5 days overlap Hep/Coumadin and D/C when INR 2-3.  Will consider lovenox and early discharge if her MS/Sundowning becomes an issue   Will hold on IVC filter.   Problem # 2: ESSENTIAL HYPERTENSION, BENIGN (ICD-401.1)  BP remains fine.  Restart HCTZ when necessary - off with hydration and pre-syncopal event.    Problem # 3 PARKINSON'S DISEASE (ICD-332.0) - remains on Requip/Sinemet/Seroquel 25 HS Look for worsening sundowning. Use Klonopin prn  Problem # 4: CARDIOMYOPATHY (ICD-425.4) - EF stable on current ECHO.  She is asymptomatic.  Watch for CHF with fluids.   5.  Urine Cx negative for UTI  6. DVT Prophylaxis  7. Afib - rate controlled and anticoagulated.  Not on BB/Ca channel Blocker or Dig yet - has not needed it.  Monitor and adjust prn.  8 - will get PT eval and Rx to get  her up moving.  Will consider D/c in 1-2 days after INR > 2    LOS: 4 days   Richard Ritchey M 10/14/2011, 8:14 AM

## 2011-10-14 NOTE — Evaluation (Signed)
Physical Therapy Evaluation Patient Details Name: Angel Greene MRN: [id number] DOB: Dec 15, 1929 Today's Date: 10/14/2011  Problem List:  Patient Active Problem List  Diagnoses  . OA (osteoarthritis) of knee  . Atrial fibrillation  . Delirium  . Parkinson disease  . Hypertensive heart disease without CHF  . History of DVT (deep vein thrombosis)  . Obesity (BMI 30-39.9)  . Tachycardia  . Pulmonary embolism, bilateral  . Hypertension    Past Medical History:  Past Medical History  Diagnosis Date  . Dysrhythmia     atrial fibrilliation  . Hypertension     hyperlipidemia-  TEE,CARDIOLYTE STRESS 11/12 with LOV  DR TILLEY AND EKG ON CHART  . Peripheral vascular disease     Hx   DVT x 2  2000  . H/O hiatal hernia   . Neuromuscular disorder     Parkinsons, managed by Dr Anne Hahn-  LOV 11/12  . Bladder infection     06/10/11 with treatment by Dr Timothy Lasso  . Arthritis   . Cancer     2000- breast cancer followed by 6 months of chemo, radiation and  PO meds  . History of DVT (deep vein thrombosis) 06/24/2002  . Parkinsonism   . Breast cancer    Past Surgical History:  Past Surgical History  Procedure Date  . Breast surgery     LUMPECTOMY  WITH AXILLIARY DISSECTION   2000  . Back surgery     lumbar x 3  ? type  . Tonsillectomy   . Cardiac catheterization     bilateral cataract extraction with IOL  . Cholecystectomy   . Joint replacement     RIGHT HIP REPLACEMENT   1996  . Total knee arthroplasty 06/23/2011    Procedure: TOTAL KNEE ARTHROPLASTY;  Surgeon: Loanne Drilling;  Location: [facility];  Service: Orthopedics;  Laterality: Right;  . Breast lumpectomy   . Mastectomy   . Breast lumpectomy     PT Assessment/Plan/Recommendation PT Assessment Clinical Impression Statement: Pt admitted with saddle PE and DVT. Pt on day 4/5 bridging with Heparin and coumadin with Heparin at goal. Pt without pain or discomfort with mobility but HR up to 163 with stairs, asymptomatic. Pt returned  to chair and activity ceased with HR down to 123 at rest. Pt will benefit from acute therapy to maximize function, mobility and activity prior to discharge.  PT Recommendation/Assessment: Patient will need skilled PT in the acute care venue PT Problem List: Decreased activity tolerance;Decreased knowledge of use of DME Barriers to Discharge: None PT Therapy Diagnosis : Abnormality of gait PT Plan PT Frequency: Min 3X/week PT Treatment/Interventions: Gait training;DME instruction;Functional mobility training;Therapeutic activities;Therapeutic exercise;Patient/family education PT Recommendation Follow Up Recommendations: No PT follow up Equipment Recommended: None recommended by PT PT Goals  Acute Rehab PT Goals PT Goal Formulation: With patient/family Time For Goal Achievement: 7 days Pt will go Sit to Stand: with modified independence PT Goal: Sit to Stand - Progress: Goal set today Pt will go Stand to Sit: with modified independence PT Goal: Stand to Sit - Progress: Goal set today Pt will Ambulate: with modified independence;with least restrictive assistive device;>150 feet (with HR < 120) PT Goal: Ambulate - Progress: Goal set today  PT Evaluation Precautions/Restrictions  Precautions Precautions: Fall Restrictions Weight Bearing Restrictions: No Prior Functioning  Home Living Lives With: Spouse Available Help at Discharge: Family Type of Home: House Home Access: Stairs to enter [employer] of Steps: 2 Entrance Stairs-Rails: Right;Left Home Layout: Two level;Able to  live on main level with bedroom/bathroom Bathroom Shower/Tub: Tub/shower unit;Walk-in shower Bathroom Toilet: Standard Home Adaptive Equipment: Bedside commode/3-in-1;Walker - rolling;Straight cane;Walker - four wheeled Prior Function Level of Independence: Independent with assistive device(s) Able to Take Stairs?: Yes Driving: No Vocation: Retired Producer, television/film/video:  Awake/alert Overall Cognitive Status: History of cognitive impairments History of Cognitive Impairment: Appears at baseline functioning Orientation Level: Oriented X4 Cognition - Other Comments: Pt initially disoriented and talking about pressure cookers but then became oriented and appropriate but again drifted end of session in chair to talking about a family wedding Sensation/Coordination Sensation Light Touch: Appears Intact Extremity Assessment RLE Assessment RLE Assessment: Within Functional Limits LLE Assessment LLE Assessment: Within Functional Limits Mobility (including Balance) Bed Mobility Bed Mobility: Yes Supine to Sit: 6: Modified independent (Device/Increase time);HOB flat Sitting - Scoot to Edge of Bed: 6: Modified independent (Device/Increase time) Transfers Transfers: Yes Sit to Stand: 5: Supervision;From bed;From toilet Sit to Stand Details (indicate cue type and reason): cueing and supervision for safety Stand to Sit: 5: Supervision;To chair/3-in-1;To toilet Stand to Sit Details: cueing and supervision for safety Ambulation/Gait Ambulation/Gait: Yes Ambulation/Gait Assistance: 5: Supervision Ambulation/Gait Assistance Details (indicate cue type and reason): cueing to step into RW and for directional cues Ambulation Distance (Feet): 200 Feet Assistive device: Rolling walker Gait Pattern: Step-through pattern;Trunk flexed;Decreased stride length Stairs: Yes Stairs Assistance: 5: Supervision Stairs Assistance Details (indicate cue type and reason): supervision for safety Stair Management Technique: One rail Left;Sideways Number of Stairs: 2  Height of Stairs: 8   Posture/Postural Control Posture/Postural Control: Postural limitations Postural Limitations: flexed posture reportedly from multiple back surgeries Exercise    End of Session PT - End of Session Equipment Utilized During Treatment: Gait belt Activity Tolerance: Other (comment) (Pt limited by  tachycardia) Patient left: in chair;with call bell in reach;with family/visitor present Nurse Communication: Mobility status for transfers;Mobility status for ambulation General Behavior During Session: [facility] for tasks performed Cognition: Impaired, at baseline  Delorse Lek 10/14/2011, 11:40 AM  Delaney Meigs, PT [phone]

## 2011-10-14 NOTE — Progress Notes (Addendum)
ANTICOAGULATION CONSULT NOTE - Follow Up Consult  Pharmacy Consult:  Heparin / Coumadin Indication: pulmonary embolus + DVT  No Known Allergies  Patient Measurements: Height: 5\' 6"  (167.6 cm) Weight: 190 lb 11.2 oz (86.5 kg) IBW/kg (Calculated) : 59.3  Heparin Dosing Weight: 78 kg  Vital Signs: Temp: 97.5 F (36.4 C) (04/11 0500) Temp src: Oral (04/11 0500) BP: 133/85 mmHg (04/11 0500) Pulse Rate: 99  (04/11 0500)  Labs:  Basename 10/14/11 0517 10/13/11 0535 10/12/11 0625 10/12/11 0545 10/11/11 1611  HGB 14.2 13.5 -- -- --  HCT 43.7 42.6 -- 41.0 --  PLT 148* 142* -- 132* --  APTT -- -- -- -- --  LABPROT 16.5* 14.7 14.3 -- --  INR 1.31 1.13 1.09 -- --  HEPARINUNFRC 0.35 0.70 0.67 -- --  CREATININE -- -- -- -- --  CKTOTAL -- -- -- -- 43  CKMB -- -- -- -- 2.4  TROPONINI -- -- -- -- <0.30   Estimated Creatinine Clearance: 56.5 ml/min (by C-G formula based on Cr of 0.88).   Medications:  Scheduled:     . aspirin EC  81 mg Oral Daily  . calcium-vitamin D  1 tablet Oral Daily  . carbidopa-levodopa  0.5 tablet Oral BID  . omega-3 acid ethyl esters  1 g Oral Daily  . QUEtiapine  12.5 mg Oral QHS  . Ropinirole HCl  1 tablet Oral QHS  . warfarin  7.5 mg Oral ONCE-1800  . Warfarin - Pharmacist Dosing Inpatient   Does not apply q1800    Assessment: 89 YOF with h/o Afib admitted with extensive B/L pulmonary emboli on chest CT. Indeterminate age of DVT in right posterior tibial vein. Patient is currently on day 4/5 recommended VTE overlap with heparin/warfarin.  INR remains subtherapeutic and heparin level is at goal but toward the low end of normal.  No bleeding reported.   Goal of Therapy:  Heparin level 0.3-0.7 units/ml INR 2 - 3    Plan:  - Increase heparin gtt to 750 units/hr - Repeat Coumadin 7.5mg  PO today - Daily HL / CBC / PT / INR    Alexus Galka D. Laney Potash, PharmD, BCPS Pager:  [phone] 10/14/2011, 8:18 AM

## 2011-10-15 LAB — CBC
MCHC: 33.5 g/dL (ref 30.0–36.0)
RDW: 16.5 % — ABNORMAL HIGH (ref 11.5–15.5)

## 2011-10-15 LAB — PROTIME-INR
INR: 1.58 — ABNORMAL HIGH (ref 0.00–1.49)
Prothrombin Time: 19.2 seconds — ABNORMAL HIGH (ref 11.6–15.2)

## 2011-10-15 LAB — HEPARIN LEVEL (UNFRACTIONATED): Heparin Unfractionated: 0.31 IU/mL (ref 0.30–0.70)

## 2011-10-15 MED ORDER — WARFARIN SODIUM 10 MG PO TABS
10.0000 mg | ORAL_TABLET | Freq: Once | ORAL | Status: AC
  Administered 2011-10-15: 10 mg via ORAL
  Filled 2011-10-15: qty 1

## 2011-10-15 NOTE — Progress Notes (Signed)
Subjective: Admitted with Saddle Emboli/Presyncope.  Afib rate controlled.  Worsening confusion from PD - stable on meds and reorientation..  She remains comfortable. No new complaints. Doing well awaiting INR to become therapeutic.  Objective: Vital signs in last 24 hours: Temp:  [97.4 F (36.3 C)-97.5 F (36.4 C)] 97.5 F (36.4 C) (04/12 0418) Pulse Rate:  [102-160] 102  (04/12 0418) Resp:  [18] 18  (04/12 0418) BP: (98-138)/(63-81) 124/77 mmHg (04/12 0418) SpO2:  [91 %-100 %] 93 % (04/12 0418) Weight:  [191 lb 2.2 oz (86.7 kg)] 191 lb 2.2 oz (86.7 kg) (04/12 0418) Weight change: 7.1 oz (0.2 kg) Last BM Date: 10/12/11  Intake/Output from previous day:  Intake/Output Summary (Last 24 hours) at 10/15/11 0754 Last data filed at 10/15/11 0400  Gross per 24 hour  Intake    240 ml  Output   1300 ml  Net  -1060 ml   04/11 0701 - 04/12 0700 In: 240 [P.O.:240] Out: 1300 [Urine:1300]   Physical Exam  General appearance: Alert, happy, oriented this am.  Throat: oropharynx moist without erythema Resp: CTA B CAR:  Irregular. HR fine. GI: soft, non-tender; bowel sounds normal; no masses,  no organomegaly Extremities: R TKR scar and less edema - minor R calf tender.   Lab Results: No results found for this basename: NA:2,K:2,CL:2,CO2:2,GLUCOSE:2,BUN:2,CREATININE:2,CALCIUM:2,MG:2,PHOS:2 in the last 72 hours  No results found for this basename: AST:2,ALT:2,ALKPHOS:2,BILITOT:2,PROT:2,ALBUMIN:2 in the last 72 hours   Basename 10/15/11 0630 10/14/11 0517  WBC 6.5 7.5  NEUTROABS -- --  HGB 13.7 14.2  HCT 40.9 43.7  MCV 91.1 91.2  PLT 144* 148*    Lab Results  Component Value Date   INR 1.31 10/14/2011   INR 1.13 10/13/2011   INR 1.09 10/12/2011    No results found for this basename: CKTOTAL:3,CKMB:3,CKMBINDEX:3,TROPONINI:3 in the last 72 hours  No results found for this basename: TSH,T4TOTAL,FREET3,T3FREE,THYROIDAB in the last 72 hours  No results found for this  basename: VITAMINB12:2,FOLATE:2,FERRITIN:2,TIBC:2,IRON:2,RETICCTPCT:2 in the last 72 hours  Micro Results: Recent Results (from the past 240 hour(s))  URINE CULTURE     Status: Normal   Collection Time   10/10/11 12:51 PM      Component Value Range Status Comment   Specimen Description URINE, RANDOM   Final    Special Requests NONE   Final    Culture  Setup Time [id number]   Final    Colony Count >=100,000 COLONIES/ML   Final    Culture     Final    Value: STAPHYLOCOCCUS SPECIES (COAGULASE NEGATIVE)     Note: RIFAMPIN AND GENTAMICIN SHOULD NOT BE USED AS SINGLE DRUGS FOR TREATMENT OF STAPH INFECTIONS.   Report Status 10/13/2011 FINAL   Final    Organism ID, Bacteria STAPHYLOCOCCUS SPECIES (COAGULASE NEGATIVE)   Final   URINE CULTURE     Status: Normal   Collection Time   10/11/11  6:18 PM      Component Value Range Status Comment   Specimen Description URINE, RANDOM   Final    Special Requests NONE   Final    Culture  Setup Time [id number]   Final    Colony Count 80,000 COLONIES/ML   Final    Culture     Final    Value: Multiple bacterial morphotypes present, none predominant. Suggest appropriate recollection if clinically indicated.   Report Status 10/12/2011 FINAL   Final      Studies/Results: No results found.   Medications: Scheduled:    .  aspirin EC  81 mg Oral Daily  . calcium-vitamin D  1 tablet Oral Daily  . carbidopa-levodopa  0.5 tablet Oral BID  . omega-3 acid ethyl esters  1 g Oral Daily  . QUEtiapine  12.5 mg Oral QHS  . Ropinirole HCl  1 tablet Oral QHS  . warfarin  7.5 mg Oral ONCE-1800  . Warfarin - Pharmacist Dosing Inpatient   Does not apply q1800   Continuous:    . sodium chloride 10 mL/hr (10/13/11 1006)  . heparin 750 Units/hr (10/14/11 0952)  . DISCONTD: heparin 700 Units/hr (10/14/11 0136)     Assessment/Plan: Principal Problem:  *Pulmonary embolism, bilateral Active Problems:  Parkinson disease  Tachycardia   Hypertension   Problem #1 - Saddle Embolus with R Heart Strain, Tachycardia, and near Syncope. - Cont Tele, D/C O2, IV Heparin/Coumadin - goal INR 2-3.   2 D Echo done: - Left ventricle: The cavity size was normal. Systolic function was mildly reduced. The estimated ejection fraction was in the range of 45% to 50%. Mild diffuse hypokinesis. - Mitral valve: Moderately calcified annulus. Moderate regurgitation. - Left atrium: The atrium was mildly to moderately dilated. - Right ventricle: The cavity size was dilated. Wall thickness was normal. - Right atrium: The atrium was mildly dilated. - Tricuspid valve: Moderate regurgitation. - Pulmonary arteries: Systolic pressure was mildly to moderately increased. PA peak pressure: 42mm Hg (S).    She is HD stable and will continue to be monitored. Thrombectomy not currently indicated.  Will hold on IVC filter. She will need Lifelong Coumadin Rx.  LE Dopplers showed indeterminate DVT noted in the right posterior tibial vein, coursing through to the popliteal and femoral veins. The common femoral vein appears thrombus free. No propagation to the left lower extremity noted.   She will probably need 5 days overlap Hep/Coumadin and D/C when INR 2-3.  Will consider lovenox and early discharge if her MS/Sundowning becomes an issue    Problem # 2: ESSENTIAL HYPERTENSION, BENIGN (ICD-401.1)  BP remains fine.  Restart HCTZ when necessary - off with hydration and pre-syncopal event.    Problem # 3 PARKINSON'S DISEASE (ICD-332.0) - remains on Requip/Sinemet/Seroquel 25 HS Look for worsening sundowning. Use Klonopin prn  Problem # 4: CARDIOMYOPATHY (ICD-425.4) - EF stable on current ECHO.  She is asymptomatic.  Watch for CHF with fluids.   5.  Urine Cx negative for UTI  6. DVT Prophylaxis  7. Afib - rate controlled and anticoagulated.  Not on BB/Ca channel Blocker or Dig yet - has not needed it.  Monitor and adjust prn.  8 - continue moving and  functioning.  Will consider D/c in 1-2 days after INR > 2    LOS: 5 days   Mekiah Wahler M 10/15/2011, 7:54 AM

## 2011-10-15 NOTE — Progress Notes (Signed)
ANTICOAGULATION CONSULT NOTE - Follow Up Consult  Pharmacy Consult for Heparin/warfarin Indication: pulmonary embolus  No Known Allergies  Patient Measurements: Height: 5\' 6"  (167.6 cm) Weight: 191 lb 2.2 oz (86.7 kg) IBW/kg (Calculated) : 59.3  Heparin Dosing Weight: 78 kg  Vital Signs: Temp: 97.5 F (36.4 C) (04/12 0418) Temp src: Oral (04/12 0418) BP: 124/77 mmHg (04/12 0418) Pulse Rate: 102  (04/12 0418)  Labs:  Basename 10/15/11 0630 10/14/11 0517 10/13/11 0535  HGB 13.7 14.2 --  HCT 40.9 43.7 42.6  PLT 144* 148* 142*  APTT -- -- --  LABPROT 19.2* 16.5* 14.7  INR 1.58* 1.31 1.13  HEPARINUNFRC 0.31 0.35 0.70  CREATININE -- -- --  CKTOTAL -- -- --  CKMB -- -- --  TROPONINI -- -- --   Estimated Creatinine Clearance: 56.6 ml/min (by C-G formula based on Cr of 0.88).   Medications:  Scheduled:     . aspirin EC  81 mg Oral Daily  . calcium-vitamin D  1 tablet Oral Daily  . carbidopa-levodopa  0.5 tablet Oral BID  . omega-3 acid ethyl esters  1 g Oral Daily  . QUEtiapine  12.5 mg Oral QHS  . Ropinirole HCl  1 tablet Oral QHS  . warfarin  7.5 mg Oral ONCE-1800  . Warfarin - Pharmacist Dosing Inpatient   Does not apply q1800    Assessment: 69 YOF with h/o Afib admitted with extensive B/L saddle pulmonary emboli on chest CT. Indeterminate age of DVT in right posterior tibial vein. Patient is currently on day 5/5 recommended VTE overlap with heparin/warfarin. INR trending upward but still low, heparin level at goal. No bleeding issues noted. Will titrate warfarin dose up to help get her at goal.  Goal of Therapy:  INR goal 2-3 Heparin level 0.3-0.7 units/ml   Plan:  1) Increase to Warfarin 10 mg x 1 tonight 2) Continue IV heparin at 750 units/hr 3) Daily INR/HL  Severiano Gilbert Pharm D, BCPS 10/15/2011,11:17 AM

## 2011-10-16 LAB — CBC
MCH: 30.1 pg (ref 26.0–34.0)
MCHC: 33.3 g/dL (ref 30.0–36.0)
Platelets: 153 10*3/uL (ref 150–400)

## 2011-10-16 LAB — PROTIME-INR: Prothrombin Time: 22.5 seconds — ABNORMAL HIGH (ref 11.6–15.2)

## 2011-10-16 MED ORDER — WARFARIN SODIUM 7.5 MG PO TABS
7.5000 mg | ORAL_TABLET | Freq: Once | ORAL | Status: AC
  Administered 2011-10-16: 7.5 mg via ORAL
  Filled 2011-10-16: qty 1

## 2011-10-16 NOTE — Progress Notes (Signed)
Subjective: She feels okay today. Her speech is somewhat tangential. She is not having shortness of breath or chest discomfort. She looks forward to going home in the near future.  Objective: Vital signs in last 24 hours: Temp:  [97.2 F (36.2 C)-98.3 F (36.8 C)] 97.2 F (36.2 C) (04/13 0314) Pulse Rate:  [99-101] 99  (04/13 0314) Resp:  [18] 18  (04/13 0314) BP: (115-130)/(67-77) 130/76 mmHg (04/13 0314) SpO2:  [92 %-95 %] 95 % (04/13 0314) Weight:  [86.5 kg (190 lb 11.2 oz)] 86.5 kg (190 lb 11.2 oz) (04/13 0314) Weight change: -0.2 kg (-7.1 oz)   Intake/Output from previous day: 04/12 0701 - 04/13 0700 In: 480 [P.O.:480] Out: 900 [Urine:900]   General appearance: alert, cooperative and no distress Resp: clear to auscultation bilaterally Cardio: irregularly irregular rhythm and  at a somewhat rapid rate Extremities: extremities normal, atraumatic, no cyanosis or edema  Lab Results:  Basename 10/16/11 0700 10/15/11 0630  WBC 5.4 6.5  HGB 13.5 13.7  HCT 40.5 40.9  PLT 153 144*   BMET No results found for this basename: NA:2,K:2,CL:2,CO2:2,GLUCOSE:2,BUN:2,CREATININE:2,CALCIUM:2 in the last 72 hours CMET CMP     Component Value Date/Time   NA 140 10/11/2011 0310   K 4.0 10/11/2011 0310   CL 106 10/11/2011 0310   CO2 26 10/11/2011 0310   GLUCOSE 107* 10/11/2011 0310   BUN 24* 10/11/2011 0310   CREATININE 0.88 10/11/2011 0310   CALCIUM 8.9 10/11/2011 0310   PROT 7.0 10/10/2011 1129   ALBUMIN 4.0 10/10/2011 1129   AST 20 10/10/2011 1129   ALT 21 10/10/2011 1129   ALKPHOS 73 10/10/2011 1129   BILITOT 0.7 10/10/2011 1129   GFRNONAA 60* 10/11/2011 0310   GFRAA 70* 10/11/2011 0310    CBG (last 3)  No results found for this basename: GLUCAP:3 in the last 72 hours  INR RESULTS:   Lab Results  Component Value Date   INR 1.94* 10/16/2011   INR 1.58* 10/15/2011   INR 1.31 10/14/2011    And and Studies/Results: No results found.  Medications: I have reviewed the patient's current  medications.  Assessment/Plan:  #1 Pulmonary Embolism: Stable on heparin and Coumadin. She's not but is very close.yet at INR greater than or equal to 2.0, but is very close. We will continue heparin and Coumadin dosing per pharmacist recommendations.  #2 Atrial Fibrillation: Heart rate appears to be somewhat elevated, and may not be picked up on vital signs recordings. We will recheck a EKG today and if her pulse rate is trending greater than 110 we'll add low-dose beta blocker treatment.  #3 Parkinson's disease: Stable on current medications.   LOS: 6 days   Elo Marmolejos G 10/16/2011, 10:28 AM

## 2011-10-16 NOTE — Progress Notes (Signed)
ANTICOAGULATION CONSULT NOTE - Follow Up Consult  Pharmacy Consult for Heparin/warfarin Indication: pulmonary embolus  No Known Allergies  Patient Measurements: Height: 5\' 6"  (167.6 cm) Weight: 190 lb 11.2 oz (86.5 kg) IBW/kg (Calculated) : 59.3  Heparin Dosing Weight: 78 kg  Vital Signs: Temp: 97.2 F (36.2 C) (04/13 0314) Temp src: Oral (04/13 0314) BP: 130/76 mmHg (04/13 0314) Pulse Rate: 99  (04/13 0314)  Labs:  Basename 10/16/11 0700 10/15/11 0630 10/14/11 0517  HGB 13.5 13.7 --  HCT 40.5 40.9 43.7  PLT 153 144* 148*  APTT -- -- --  LABPROT 22.5* 19.2* 16.5*  INR 1.94* 1.58* 1.31  HEPARINUNFRC 0.33 0.31 0.35  CREATININE -- -- --  CKTOTAL -- -- --  CKMB -- -- --  TROPONINI -- -- --   Estimated Creatinine Clearance: 56.5 ml/min (by C-G formula based on Cr of 0.88).   Medications:  Heparin at 750 units/hr Warfarin   Assessment: 59 YOF with h/o Afib admitted with extensive B/L saddle pulmonary emboli on chest CT. Indeterminate age of DVT in right posterior tibial vein. Patient is currently on day 6/5 recommended VTE overlap with heparin/warfarin. INR trending upward and now just below goal at 1.9, heparin level at goal. No bleeding issues noted.   Goal of Therapy:  INR goal 2-3 Heparin level 0.3-0.7 units/ml   Plan:  1) Increase to Warfarin 7.5 mg x 1 tonight 2) Continue IV heparin at 750 units/hr 3) Daily INR/HL  Angel Greene Pharm D, BCPS 10/16/2011,1:36 PM

## 2011-10-17 ENCOUNTER — Inpatient Hospital Stay (HOSPITAL_COMMUNITY): Payer: Medicare Other

## 2011-10-17 LAB — CBC
MCH: 30.7 pg (ref 26.0–34.0)
MCV: 89.4 fL (ref 78.0–100.0)
Platelets: 162 10*3/uL (ref 150–400)
RBC: 4.43 MIL/uL (ref 3.87–5.11)

## 2011-10-17 MED ORDER — WARFARIN SODIUM 7.5 MG PO TABS
7.5000 mg | ORAL_TABLET | Freq: Once | ORAL | Status: AC
  Administered 2011-10-17: 7.5 mg via ORAL
  Filled 2011-10-17: qty 1

## 2011-10-17 NOTE — Progress Notes (Signed)
ANTICOAGULATION CONSULT NOTE - Follow Up Consult  Pharmacy Consult for heparin and coumadin Indication: pulmonary embolus  No Known Allergies  Patient Measurements: Height: 5\' 6"  (167.6 cm) Weight: 190 lb 7.6 oz (86.4 kg) IBW/kg (Calculated) : 59.3  Heparin Dosing Weight:   Vital Signs: Temp: 97.8 F (36.6 C) (04/14 0530) Temp src: Oral (04/14 0530) BP: 120/80 mmHg (04/14 0530) Pulse Rate: 91  (04/14 0530)  Labs:  Basename 10/17/11 0540 10/16/11 0700 10/15/11 0630  HGB 13.6 13.5 --  HCT 39.6 40.5 40.9  PLT 162 153 144*  APTT -- -- --  LABPROT 25.4* 22.5* 19.2*  INR 2.27* 1.94* 1.58*  HEPARINUNFRC 0.39 0.33 0.31  CREATININE -- -- --  CKTOTAL -- -- --  CKMB -- -- --  TROPONINI -- -- --   Estimated Creatinine Clearance: 56.4 ml/min (by C-G formula based on Cr of 0.88).  Assessment: Patient is an 78 y.o F on anticoagulation day #6 overlap for Afib and new PE.  INR and heparin level are both  therapeutic this morning.  No bleeding noted.  Goal of Therapy:  Heparin level 0.3-0.7 units/ml; INR 2-3   Plan:  1) repeat Coumadin 7.5mg  PO x1 2) Consider d/c heparin since today is day#6 of overlap and INR now >2  Lucia Gaskins 10/17/2011,9:25 AM

## 2011-10-17 NOTE — Progress Notes (Signed)
Subjective: She feels weak today, continues to have mild DOE, has right frontal headache that is mild.  Objective: Vital signs in last 24 hours: Temp:  [97.8 F (36.6 C)-98.3 F (36.8 C)] 97.9 F (36.6 C) (04/14 1429) Pulse Rate:  [90-102] 90  (04/14 1429) Resp:  [16-18] 16  (04/14 1429) BP: (97-120)/(67-80) 101/67 mmHg (04/14 1429) SpO2:  [91 %-94 %] 91 % (04/14 1429) Weight:  [86.4 kg (190 lb 7.6 oz)] 86.4 kg (190 lb 7.6 oz) (04/14 0530) Weight change: -0.1 kg (-3.5 oz)   Intake/Output from previous day: 04/13 0701 - 04/14 0700 In: 1209.2 [I.V.:1209.2] Out: 750 [Urine:750]   General appearance: alert, cooperative and no distress Resp: clear to auscultation bilaterally Cardio: regular rate and rhythm, S1, S2 normal, no murmur, click, rub or gallop Neurologic: Mental status: Alert, oriented, thought content appropriate Coordination: she had past pointing with finger to nose testing on the left side  Lab Results:  Basename 10/17/11 0540 10/16/11 0700  WBC 5.7 5.4  HGB 13.6 13.5  HCT 39.6 40.5  PLT 162 153   BMET No results found for this basename: NA:2,K:2,CL:2,CO2:2,GLUCOSE:2,BUN:2,CREATININE:2,CALCIUM:2 in the last 72 hours CMET CMP     Component Value Date/Time   NA 140 10/11/2011 0310   K 4.0 10/11/2011 0310   CL 106 10/11/2011 0310   CO2 26 10/11/2011 0310   GLUCOSE 107* 10/11/2011 0310   BUN 24* 10/11/2011 0310   CREATININE 0.88 10/11/2011 0310   CALCIUM 8.9 10/11/2011 0310   PROT 7.0 10/10/2011 1129   ALBUMIN 4.0 10/10/2011 1129   AST 20 10/10/2011 1129   ALT 21 10/10/2011 1129   ALKPHOS 73 10/10/2011 1129   BILITOT 0.7 10/10/2011 1129   GFRNONAA 60* 10/11/2011 0310   GFRAA 70* 10/11/2011 0310    CBG (last 3)  No results found for this basename: GLUCAP:3 in the last 72 hours  INR RESULTS:   Lab Results  Component Value Date   INR 2.27* 10/17/2011   INR 1.94* 10/16/2011   INR 1.58* 10/15/2011     Studies/Results: No results found.  Medications: I have reviewed the  patient's current medications.  Assessment/Plan: #1 Anticoagulation and PE:  Stable  And should be able to stop heparin in the morning, discharge on coumadin. #2 Headache:  Unclear cause with abnormal test of cerebellar function, so we will check a CT head to make sure that she has not had a CNS Bleed.  #3 Gait Instability: mild and we will have nursing staff ambulate her with assistance every shift.  LOS: 7 days   Diane Hanel G 10/17/2011, 3:06 PM

## 2011-10-18 LAB — CBC
HCT: 42.7 % (ref 36.0–46.0)
Hemoglobin: 14 g/dL (ref 12.0–15.0)
MCV: 92 fL (ref 78.0–100.0)
RDW: 16.8 % — ABNORMAL HIGH (ref 11.5–15.5)
WBC: 5.4 10*3/uL (ref 4.0–10.5)

## 2011-10-18 MED ORDER — FUROSEMIDE 40 MG PO TABS: 40.0000 mg | ORAL_TABLET | Freq: Every day | ORAL | Status: DC | PRN

## 2011-10-18 MED ORDER — CLONAZEPAM 0.5 MG PO TABS: 0.2500 mg | ORAL_TABLET | Freq: Two times a day (BID) | ORAL | Status: DC | PRN

## 2011-10-18 MED ORDER — WARFARIN SODIUM 5 MG PO TABS: 5.0000 mg | ORAL_TABLET | Freq: Every day | ORAL | Status: DC

## 2011-10-18 MED ORDER — OMEGA-3-ACID ETHYL ESTERS 1 G PO CAPS: 1.0000 g | ORAL_CAPSULE | Freq: Every day | ORAL | Status: DC

## 2011-10-18 MED ORDER — ACETAMINOPHEN 325 MG PO TABS: 650.0000 mg | ORAL_TABLET | ORAL | Status: DC | PRN

## 2011-10-18 MED ORDER — METOPROLOL SUCCINATE ER 25 MG PO TB24
25.0000 mg | ORAL_TABLET | Freq: Every day | ORAL | Status: AC
  Administered 2011-10-18: 25 mg via ORAL
  Filled 2011-10-18: qty 1

## 2011-10-18 MED ORDER — METOPROLOL SUCCINATE ER 25 MG PO TB24: 12.5000 mg | ORAL_TABLET | Freq: Every day | ORAL | Status: DC

## 2011-10-18 MED ORDER — HYDROCHLOROTHIAZIDE 12.5 MG PO TABS: 12.5000 mg | ORAL_TABLET | Freq: Every day | ORAL | Status: DC

## 2011-10-18 NOTE — Discharge Summary (Signed)
Physician Discharge Summary  DISCHARGE SUMMARY   Patient ID: Angel Greene MR#: [id number] DOB/AGE: Jul 03, 1930 78 y.o.   Attending Physician:Syris Brookens M  Patient's PCP:No primary provider on file.  Consults: none  Admit date: 10/10/2011 Discharge date: 10/18/2011  Discharge Diagnoses:  Principal Problem:  *Pulmonary embolism, bilateral Active Problems:  Parkinson disease  Tachycardia  Hypertension   Patient Active Problem List  Diagnoses  . OA (osteoarthritis) of knee  . Atrial fibrillation  . Delirium  . Parkinson disease  . Hypertensive heart disease without CHF  . History of DVT (deep vein thrombosis)  . Obesity (BMI 30-39.9)  . Tachycardia  . Pulmonary embolism, bilateral  . Hypertension   Past Medical History  Diagnosis Date  . Dysrhythmia     atrial fibrilliation  . Hypertension     hyperlipidemia-  TEE,CARDIOLYTE STRESS 11/12 with LOV  DR TILLEY AND EKG ON CHART  . Peripheral vascular disease     Hx   DVT x 2  2000  . H/O hiatal hernia   . Neuromuscular disorder     Parkinsons, managed by Dr Anne Hahn-  LOV 11/12  . Bladder infection     06/10/11 with treatment by Dr Timothy Lasso  . Arthritis   . Cancer     2000- breast cancer followed by 6 months of chemo, radiation and  PO meds  . History of DVT (deep vein thrombosis) 06/24/2002  . Parkinsonism   . Breast cancer     Discharged Condition: Stable for her   Discharge Medications: Medication List  As of 10/18/2011  8:19 AM   STOP taking these medications         meloxicam 15 MG tablet         TAKE these medications         acetaminophen 325 MG tablet   Commonly known as: TYLENOL   Take 2 tablets (650 mg total) by mouth every 4 (four) hours as needed.      aspirin EC 81 MG tablet   Take 81 mg by mouth daily.      CALTRATE 600+D 600-400 MG-UNIT per chew tablet   Generic drug: Calcium Carbonate-Vitamin D   Chew 1 tablet by mouth daily.      carbidopa-levodopa 25-100 MG per tablet   Commonly  known as: SINEMET IR   Take 0.5 tablets by mouth 2 (two) times daily.      clonazePAM 0.5 MG tablet   Commonly known as: KLONOPIN   Take 0.5 tablets (0.25 mg total) by mouth 2 (two) times daily as needed for anxiety.      fish oil-omega-3 fatty acids 1000 MG capsule   Take 1 g by mouth daily.      furosemide 40 MG tablet   Commonly known as: LASIX   Take 1 tablet (40 mg total) by mouth daily as needed (Significant edema).      hydrochlorothiazide 12.5 MG tablet   Commonly known as: HYDRODIURIL   Take 1 tablet (12.5 mg total) by mouth daily.      metoprolol succinate 25 MG 24 hr tablet   Commonly known as: TOPROL-XL   Take 0.5 tablets (12.5 mg total) by mouth daily.      omega-3 acid ethyl esters 1 G capsule   Commonly known as: LOVAZA   Take 1 capsule (1 g total) by mouth daily.      QUEtiapine 25 MG tablet   Commonly known as: SEROQUEL   Take 12.5 mg by mouth at bedtime.  REQUIP XL 12 MG Tb24   Generic drug: Ropinirole HCl   Take 1 tablet by mouth at bedtime.      traMADol 50 MG tablet   Commonly known as: ULTRAM   Take 50 mg by mouth every 6 (six) hours as needed. Maximum dose= 8 tablets per day. PAIN        warfarin 5 MG tablet   Commonly known as: COUMADIN   Take 1 tablet (5 mg total) by mouth daily.            Hospital Procedures: Ct Head Wo Contrast  10/17/2011  *RADIOLOGY REPORT*  Clinical Data: Headache.  On Coumadin.  CT HEAD WITHOUT CONTRAST  Technique:  Contiguous axial images were obtained from the base of the skull through the vertex without contrast.  Comparison: Head CT 10/29/2009  Findings: No evidence of acute intracranial abnormality. Specifically, there is no hemorrhage, hydrocephalus, mass effect, mass lesion, or evidence of acute cortically based infarction. Stable cerebral volume loss.  Atherosclerotic vascular calcifications of the visualized portion of the distal vertebral arteries.  The visualized paranasal sinuses, mastoid air cells,  and middle ears are clear.  The skull is intact.  IMPRESSION: No acute intracranial abnormality.  Original Report Authenticated By: Britta Mccreedy, M.D.   Ct Angio Chest W/cm &/or Wo Cm  10/10/2011  *RADIOLOGY REPORT*  Clinical Data: There is syncope.  Elevated D-dimer.  Atrial fibrillation.  CT ANGIOGRAPHY CHEST  Technique:  Multidetector CT imaging of the chest using the standard protocol during bolus administration of intravenous contrast. Multiplanar reconstructed images including MIPs were obtained and reviewed to evaluate the vascular anatomy.  Contrast: 60mL OMNIPAQUE IOHEXOL 350 MG/ML SOLN  Comparison: Chest x-ray dated 10/10/2011 and CT angiogram of the chest dated 06/25/2011  Findings: The patient has a subtle pulmonary embolus, larger on the right than the left with occlusion of most of the branch pulmonary arteries on the right proximally.  The embolus in the left pulmonary artery is smaller but extends into the upper and lower lobes.  No infiltrates or or effusions.  Scarring in both lungs is stable. Overall heart size is normal.  No acute osseous abnormality.  IMPRESSION: Extensive bilateral pulmonary emboli.  Critical Value/emergent results were called by telephone at the time of interpretation on 10/10/2011  at 5:30 p.m.  to  the patient's  ER physician, who verbally acknowledged these results.  Original Report Authenticated By: Gwynn Burly, M.D.   Dg Chest Port 1 View  10/10/2011  *RADIOLOGY REPORT*  Clinical Data: Near syncope, tachycardia and weakness.  History of atrial fibrillation.  PORTABLE CHEST - 1 VIEW  Comparison: 06/25/2011  Findings: Stable mild cardiomegaly.  Stable elevation of the left hemidiaphragm with associated atelectasis and also chronic scarring at the left lung base.  No edema or infiltrate.  Stable tortuosity of the thoracic aorta.  IMPRESSION: No active disease.  Stable mild cardiomegaly and chronic elevation of the left hemidiaphragm.  Original Report Authenticated  By: Reola Calkins, M.D.    History of Present Illness: 30 F With PD on mult meds, H/O prior DVTs, recent R TKR 06/2011 complicated by hallucinations and FTT, and Sig Back pains S/P recent ESI the Monday prior to admit. Only issue with ESI was incontinence that has resolved. She hadbeen in her usual state of health until 2-3 days PTA when she felt lazy, tired, minimum SOB, No CP. No major issues. No leg pain. She had been walking fine on the R leg. She has used  the cane in and out of the house. Back was better for a few days and then pain had returned. She is due to see Dr Danielle Dess in follow up soon. She went to [place] on day of admission and had some symptoms getting out of car and while moving into church =  she was dizzy and lightheaded and leaned up against the wall. She was lowered to the ground. She felt faint/Pre Syncope. Pt denied CP, Diaphoresis, or SOB. EMS reports that when placed on monitor pt was Afib with a rate of 170. Pt has hx of Afib. EMS reports that nurse at church reported pt's BP to be 180/90. EMS reports BP 130/70 sitting and 110/60 standing. EMS reports CBG 124. EKG in ED = Sinus Tachy. D Dimer very high. CTPA + for Saddle Embolus. She has been started on Heparin and IVF. Never lost consciousness and did not pass out. Dr Donnie Aho is her Cardiologist, Dr Lequita Halt is her Ortho, Dr Danielle Dess is [facility], and Dr Anne Hahn is her Neurologist. She was be admitted.   Hospital Course: Problem #1 - Saddle Embolus with R Heart Strain on admit, Tachycardia which converted to Afib - Asymptomatic, and near Syncope. - She was admitted to [facility] and placed on O2, fluids, and IV Heparin.  Pharm helped manage the anticoagulation. She remained HD stable.  Thrombectomy was not indicated.  IVC filter felt not necessary as well.  She will need Lifelong Coumadin Rx.  LE Dopplers showed indeterminate DVT noted in the right posterior tibial vein, coursing through to the popliteal and femoral veins. The common femoral  vein appears thrombus free. No propagation to the left lower extremity noted.  She received 6 days overlap Hep/Coumadin.  Currently INR 2-3 for two days and she is ready for D/C.   2 D Echo done:  - Left ventricle: The cavity size was normal. Systolic function was mildly reduced. The estimated ejection fraction was in the range of 45% to 50%. Mild diffuse hypokinesis. - Mitral valve: Moderately calcified annulus. Moderate regurgitation. - Left atrium: The atrium was mildly to moderately dilated. - Right ventricle: The cavity size was dilated. Wall thickness was normal. - Right atrium: The atrium was mildly dilated. - Tricuspid valve: Moderate regurgitation. - Pulmonary arteries: Systolic pressure was mildly to moderately increased. PA peak pressure: 42mm Hg (S).  Coumadin dosing: 4/14   7.5mg  4/13   7.5mg  4/12    10mg  4/11   7.5mg  4/10     5mg ? 4/9       5mg  4/8       5mg    Problem # 2: ESSENTIAL HYPERTENSION, BENIGN (ICD-401.1)  BP has been fine.  HCTZ was held due to the R Heart strain on admit. Can be started as outpt.   Problem # 3: HYPERLIPIDEMIA (ICD-272.4)  lipids Have been fine as outpt.  Holding on meds unless absolutely needed.   Problem # 4: IMPAIRED FASTING GLUCOSE (ICD-790.21)  Hgb A1C: 5.0  Not DM - follow.   Problem # 5: PARKINSON'S DISEASE (ICD-332.0)  Parkinson's disease--Dr Willis/Hallucinations/Rapid eye movement sleep disorder  having less hallucinations than she did after her R TKR.  Her memory is currently baseline.  Mild Current tremors.  She remains on Requip.  She is back on Sinemet.  Doing better on Seroquel 25 HS She had some sundowning on day 1-2 and did well otherwise.   Problem # 6: CARDIOMYOPATHY (ICD-425.4)  EF 39- 50% on Echo prior to Surgery. EF stable on current ECHO. She  is asymptomatic.  CK and Trop I were (-)  Problem # 7: THYROID STIMULATING HORMONE, ABNORMAL (ICD-246.9)  TSH 4.57.  1.311 on recheck in hospital  Problem # 8:  OBESITY (ICD-278.00)  Needs some weight loss which would help her with her LBP, Radiculopathy, OA and other issues.   Problem # 9: ATRIAL FIBRILLATION, PAROXYSMAL (ICD-427.31) - She is back in AFib at HR 80-105.  Low dose BB started. She will be on Coumadin for her VTE issue and Afib = Lifelong.  Problem # 10 OSTEOPENIA (ICD-733.90)  DEXA 12/15/10- Osteopenia by WHO Criteria. No fractures on IVA but lots of changes c/w OA and prior L/S Surgeries. S/P Lumbar Fusion and lami. R Hip Fracture secondary to Accident and not considered fragility. L Hip shows worsening BMD. Radial Forearm; Back/L/S Spine BMD #s difficult to read. She remains on Ca and D. She remains off femera and Tomoxifen that she was on in the past. At this time i do not want to add any new meds and just wish to recheck DEXA and IVA in 2 years.   Problem # 11: OSTEOARTHRITIS (ICD-715.90)  Osteoarthritis (Dr. Lequita Halt). S/P R TKR 06/23/11 complicated by worsening memory and difficulty getting back to baseline  no current pains. She has L TKR surgery in 10/2011 (on Hold due to fear of post op Confusion/Delerium>  Some mid - LBP.  S/P ESI recently per Dr Danielle Dess - follow up as directed.  12. Urine was a little dirty -  U Cx (-)  13. continue moving and functioning.  She has been low motivation.  She is at baseline.  Her HA yesterday is gone.  CCT was (-).  If she gets up today and moves she is ready and stable for D/C.  She was seen by PT on 10/14/11 and no recs were needed.    Day of Discharge Exam BP 124/78  Pulse 90  Temp(Src) 97.6 F (36.4 C) (Oral)  Resp 18  Ht 5\' 6"  (1.676 m)  Wt 86.8 kg (191 lb 5.8 oz)  BMI 30.89 kg/m2  SpO2 96%  Physical Exam: General appearance: Alert Eyes: no scleral icterus Throat: oropharynx moist without erythema Resp: Clear Cardio: Irreg GI: soft, non-tender; bowel sounds normal; no masses,  no organomegaly Extremities: RLE less edema than admit but more than L and less tender than  admit.  Discharge Labs: No results found for this basename: NA:2,K:2,CL:2,CO2:2,GLUCOSE:2,BUN:2,CREATININE:2,CALCIUM:2,MG:2,PHOS:2 in the last 72 hours No results found for this basename: AST:2,ALT:2,ALKPHOS:2,BILITOT:2,PROT:2,ALBUMIN:2 in the last 72 hours  Basename 10/18/11 0650 10/17/11 0540  WBC 5.4 5.7  NEUTROABS -- --  HGB 14.0 13.6  HCT 42.7 39.6  MCV 92.0 89.4  PLT 162 162   No results found for this basename: CKTOTAL:3,CKMB:3,CKMBINDEX:3,TROPONINI:3 in the last 72 hours No results found for this basename: TSH,T4TOTAL,FREET3,T3FREE,THYROIDAB in the last 72 hours No results found for this basename: VITAMINB12:2,FOLATE:2,FERRITIN:2,TIBC:2,IRON:2,RETICCTPCT:2 in the last 72 hours Lab Results  Component Value Date   INR 2.29* 10/18/2011   INR 2.27* 10/17/2011   INR 1.94* 10/16/2011       Discharge instructions:  01-Home or Self Care Follow-up Information    Follow up in 2 days.          Disposition: Home  Follow-up Appts: Follow-up with Dr. Timothy [facility]asso at [facility] in 2 days.  Call for appointment.  Condition on Discharge: stable  Tests Needing Follow-up: PT/INR  Time spent in discharge (includes decision making & examination of pt): *28 min  Signed: Krystiana Fornes M 10/18/2011,  8:19 AM

## 2011-10-18 NOTE — Progress Notes (Signed)
Physical Therapy Treatment Patient Details Name: Angel Greene MRN: [id number] DOB: July 17, 1929 Today's Date: 10/18/2011  PT Assessment/Plan  PT - Assessment/Plan Comments on Treatment Session: Pt with bil PE and Afib who is progressing with mobility but continues to have tachycardia with activity. Pt HR 93 EOB and up to 114 after 100' at 200' HR up to 165 with RN Alycia Rossetti and charge RN Selena Batten) aware. RN stated MD aware and plan for return home pt asymptomatic throughout and stood for 3 min with decrease to 135 for HR. Returned to room with decreased gait speed and more frequent stops with HR maintaining 130-135 with ambulation. HR back to 120 at rest. RN fully aware the entire time and pt remained asymptomatic.  PT Plan: Discharge plan remains appropriate PT Goals  Acute Rehab PT Goals PT Goal: Sit to Stand - Progress: Progressing toward goal PT Goal: Stand to Sit - Progress: Progressing toward goal PT Goal: Ambulate - Progress: Progressing toward goal  PT Treatment Precautions/Restrictions  Precautions Precautions: Fall Restrictions Weight Bearing Restrictions: No Mobility (including Balance) Bed Mobility Supine to Sit: 6: Modified independent (Device/Increase time);HOB flat;With rails Sitting - Scoot to Edge of Bed: 6: Modified independent (Device/Increase time) Transfers Sit to Stand: From bed;From toilet;5: Supervision Sit to Stand Details (indicate cue type and reason): supervision for safety Stand to Sit: 5: Supervision;To toilet;To bed Stand to Sit Details: supervision for safety Ambulation/Gait Ambulation/Gait Assistance: 5: Supervision Ambulation/Gait Assistance Details (indicate cue type and reason): cues to step into RW and for standing rest breaks secondary to tachycardia Ambulation Distance (Feet): 400 Feet Assistive device: Rolling walker Gait Pattern: Step-through pattern;Decreased stride length Stairs: No    Exercise  General Exercises - Lower Extremity Long Arc Quad:  AROM;Both;15 reps;Seated Hip Flexion/Marching: AROM;Both;Other reps (comment);Seated (15reps) End of Session PT - End of Session Equipment Utilized During Treatment: Gait belt Activity Tolerance: Patient tolerated treatment well;Other (comment) (Pt with tachycardia and RN aware) Patient left: in bed;with call bell in reach;with family/visitor present Nurse Communication: Mobility status for transfers;Mobility status for ambulation General Behavior During Session: [facility] for tasks performed Cognition: Impaired, at baseline  Delorse Lek 10/18/2011, 10:39 AM Delaney Meigs, PT [phone]

## 2011-10-18 NOTE — Progress Notes (Signed)
Patient walked with PT and HR got up into the 150-180s, patient asymptomatic.  MD made aware with orders given for a HR med.  I gave and patients HR stayed around 85, will walk again shortly and monitor.

## 2011-10-18 NOTE — Discharge Instructions (Signed)
Pulmonary Embolus A pulmonary (lung) embolus (PE) is a blood clot that has traveled from another place in the body to the lung. Most clots come from deep veins in the legs or pelvis. PE is a dangerous and potentially life-threatening condition that can be treated if identified. CAUSES Blood clots form in a vein for different reasons. Usually several things cause blood clots. They include:  The flow of blood slows down.   The inside of the vein is damaged in some way.   The person has a condition that makes the blood clot more easily. These conditions may include:   Older age (especially over 75 years old).   Having a history of blood clots.   Having major or lengthy surgery. Hip surgery is particularly high-risk.   Breaking a hip or leg.   Sitting or lying still for a long time.   Cancer or cancer treatment.   Having a long, thin tube (catheter) placed inside a vein during a medical procedure.   Being overweight (obese).   Pregnancy and childbirth.   Medicines with estrogen.   Smoking.   Other circulation or heart problems.  SYMPTOMS  The symptoms of a PE usually start suddenly and include:  Shortness of breath.   Coughing.   Coughing up blood or blood-tinged mucus (phlegm).   Chest pain. Pain is often worse with deep breaths.   Rapid heartbeat.  DIAGNOSIS  If a PE is suspected, your caregiver will take a medical history and carry out a physical exam. Your caregiver will check for the risk factors listed above. Tests that also may be required include:  Blood tests, including studies of the clotting properties of your blood.   Imaging tests. Ultrasound, CT, MRI, and other tests can all be used to see if you have clots in your legs or lungs. If you have a clot in your legs and have breathing or chest problems, your caregiver may conclude that you have a clot in your lungs. Further lung tests may not be needed.   An EKG can look for heart strain from blood clots in  the lungs.  PREVENTION   Exercise the legs regularly. Take a brisk 30 minute walk every day.   Maintain a weight that is appropriate for your height.   Avoid sitting or lying in bed for long periods of time without moving your legs.   Women, particularly those over the age of 35, should consider the risks and benefits of taking estrogen medicines, including birth control pills.   Do not smoke, especially if you take estrogen medicines.   Long-distance travel can increase your risk. You should exercise your legs by walking or pumping the muscles every hour.   In hospital prevention:   Your caregiver will assess your need for preventive PE care (prophylaxis) when you are admitted to the hospital. If you are having surgery, your surgeon will assess you the day of or day after surgery.   Prevention may include medical and nonmedical measures.  TREATMENT   The most common treatment for a PE is blood thinning (anticoagulant) medicine, which reduces the blood's tendency to clot. Anticoagulants can stop new blood clots from forming and old ones from growing. They cannot dissolve existing clots. Your body does this by itself over time. Anticoagulants can be given by mouth, by intravenous (IV) access, or by injection. Your caregiver will determine the best program for you.   Less commonly, clot-dissolving drugs (thrombolytics) are used to dissolve a PE. They   carry a high risk of bleeding, so they are used mainly in severe cases.   Very rarely, a blood clot in the leg needs to be removed surgically.   If you are unable to take anticoagulants, your caregiver may arrange for you to have a filter placed in a main vein in your belly (abdomen). This filter prevents clots from traveling to your lungs.  HOME CARE INSTRUCTIONS   Take all medicines prescribed by your caregiver. Follow the directions carefully.   You will most likely continue taking anticoagulants after you leave the hospital. Your  caregiver will advise you on the length of treatment (usually 3 to 6 months, sometimes for life).   Taking too much or too little of an anticoagulant is dangerous. While taking this type of medicine, you will need to have regular blood tests to be sure the dose is correct. The dose can change for many reasons. It is critically important that you take this medicine exactly as prescribed and that you have blood tests exactly as directed.   Many foods can interfere with anticoagulants. These include foods high in vitamin K, such as spinach, kale, broccoli, cabbage, collard and turnip greens, Brussels sprouts, peas, cauliflower, seaweed, parsley, beef and pork liver, green tea, and soybean oil. Your caregiver should discuss limits on these foods with you or you should arrange a visit with a dietician to answer your questions.   Many medicines can interfere with anticoagulants. You must tell your caregiver about any and all medicines you take. This includesall vitamins and supplements. Be especially cautious with aspirin and anti-inflammatory medicines. Ask your caregiver before taking these.   Anticoagulants can have side effects, mostly excessive bruising or bleeding. You will need to hold pressure over cuts for longer than usual. Avoid alcoholic drinks or consume only very small amounts while taking this medicine.   If you are taking an anticoagulant:   Wear a medical alert bracelet.   Notify your dentist or other caregivers before procedures.   Avoid contact sports.   Ask your caregiver how soon you can go back to normal activities. Not being active can lead to new clots. Ask for a list of what you should and should not do.   Exercise your lower leg muscles. This is important while traveling.   You may need to wear compression stockings. These are tight elastic stockings that apply pressure to the lower legs. This can help keep the blood in the legs from clotting.   If you are a smoker, you  should quit.   Learn as much as you can about pulmonary embolisms.  SEEK MEDICAL CARE IF:   You notice a rapid heartbeat.   You feel weaker or more tired than usual.   You feel faint.   You notice increased bruising.   Your symptoms are not getting better in the time expected.   You are having side effects of medicine.   You have an oral temperature above 102 F (38.9 C).   You discover other family members with blood clots. This may require further testing for inherited diseases or conditions.  SEEK IMMEDIATE MEDICAL CARE IF:   You have chest pain.   You have trouble breathing.   You have new or increased swelling or pain in one leg.   You cough up blood.   You notice blood in vomit, in a bowel movement, or in urine.   You have an oral temperature above 102 F (38.9 C), not controlled by medicine.    You may have another PE. A blood clot in the lungs is a medical emergency. Call your local emergency services (911 in [place] to get to the nearest hospital or clinic. Do not drive yourself. MAKE SURE YOU:   Understand these instructions.   Will watch your condition.   Will get help right away if you are not doing well or get worse.  Document Released: 06/18/2000 Document Revised: 06/10/2011 Document Reviewed: 12/23/2008 [employer] Patient Information 2012 [employer]. 

## 2011-10-27 ENCOUNTER — Encounter: Payer: Self-pay | Admitting: Neurology

## 2011-10-27 ENCOUNTER — Ambulatory Visit (INDEPENDENT_AMBULATORY_CARE_PROVIDER_SITE_OTHER): Payer: Medicare Other | Admitting: Neurology

## 2011-10-27 VITALS — BP 120/80 | HR 80

## 2011-10-27 DIAGNOSIS — G20 Parkinson's disease: Secondary | ICD-10-CM

## 2011-10-27 MED ORDER — CLONAZEPAM 0.5 MG PO TABS: 0.5000 mg | ORAL_TABLET | Freq: Every day | ORAL | Status: DC

## 2011-10-27 NOTE — Progress Notes (Signed)
Dear Dr. Timothy Lasso,  Thank you for having me see Angel Greene in consultation today at [facility] for her problem with Parkinson's disease and REM behavior disorder.  As you may recall, she is a 78 y.o. 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.  It sounds like her primary symptoms were her mainly left sided tremor.  She denies significant changes in her walking, falls, or change in her dexterity.  She was treated with Sinemet 100/25 tid.  She unfortunately can't tell if this helped her.  She does not find the tremor disabling.  She was switched to Requip XR 12mg  recently, although she can't remember when.  She recently stopped this, along with her Seroquel 12.5mg  qhs and her clonazepam 0.25 bid as she did not think this was helping her symptoms.  Her biggest problems however are her "hallucinations".  These actually don't sound like hallucinations but rather vivid dreams that wake her up.  She also strikes out and moves during dreaming as well as verbalizing.  She carries a diagnosis of REMBD.  She denies hallucinations during the day.  She says the dreaming starts early in the evening around midnight, after she has gone to be at 10 p.m.She does not get significant lightheadedness.  She is not incontinent of urine although has had UTIs.  She does think that her tremor has gotten worse since stopping her Requip, clonazepam and Seroquel.  She says she is not feeling as good, but can't describe why.  When she had a recent TKR she had significant problems with hallucinations, confusion during her hospital and rehab stay.  It is felt that she should not have another knee replacement given her problems with confusion.  Past Medical History  Diagnosis Date  . Dysrhythmia     atrial fibrilliation  . Hypertension    hyperlipidemia-  TEE,CARDIOLYTE STRESS 11/12 with LOV  DR TILLEY AND EKG ON CHART  . Peripheral vascular disease     Hx   DVT x 2  2000  . H/O hiatal hernia   . Neuromuscular disorder     Parkinsons, managed by Dr Anne Hahn-  LOV 11/12  . Bladder infection     06/10/11 with treatment by Dr Timothy Lasso  . Arthritis   . Cancer     2000- breast cancer followed by 6 months of chemo, radiation and  PO meds  . History of DVT (deep vein thrombosis) 06/24/2002  . Parkinsonism   . Breast cancer     Past Surgical History  Procedure Date  . Breast surgery     LUMPECTOMY  WITH AXILLIARY DISSECTION   2000  . Back surgery     2006 - X-Stop spacers placedL3-L4, L4-L5; 2008 - laminectomy and fusion, L3-L5; 2011 Laminectomy and extension of fusion L5-S1  . Tonsillectomy   . Cardiac catheterization     bilateral cataract extraction with IOL  . Cholecystectomy   . Joint replacement     RIGHT HIP REPLACEMENT   1996  . Total knee arthroplasty 06/23/2011    Procedure: TOTAL KNEE ARTHROPLASTY;  Surgeon: Loanne Drilling;  Location: [facility];  Service: Orthopedics;  Laterality: Right;  . Breast lumpectomy   . Mastectomy   . Breast lumpectomy     History   Social History  . Marital Status: Married    Spouse Name: N/A  Number of Children: N/A  . Years of Education: N/A   Social History Main Topics  . Smoking status: Never Smoker   . Smokeless tobacco: Never Used  . Alcohol Use: No  . Drug Use: No  . Sexually Active: None   Other Topics Concern  . None   Social History Narrative  . None    No family history on file.  Current Outpatient Prescriptions on File Prior to Visit  Medication Sig Dispense Refill  . acetaminophen (TYLENOL) 325 MG tablet Take 2 tablets (650 mg total) by mouth every 4 (four) hours as needed.      Marland Kitchen aspirin EC 81 MG tablet Take 81 mg by mouth daily.      . Calcium Carbonate-Vitamin D (CALTRATE 600+D) 600-400 MG-UNIT per chew tablet Chew 1 tablet by mouth daily.      .  furosemide (LASIX) 40 MG tablet Take 1 tablet (40 mg total) by mouth daily as needed (Significant edema).  30 tablet    . hydrochlorothiazide (HYDRODIURIL) 12.5 MG tablet Take 1 tablet (12.5 mg total) by mouth daily.      . metoprolol succinate (TOPROL XL) 25 MG 24 hr tablet Take 0.5 tablets (12.5 mg total) by mouth daily.  15 tablet  5  . omega-3 acid ethyl esters (LOVAZA) 1 G capsule Take 1 capsule (1 g total) by mouth daily.      . QUEtiapine (SEROQUEL) 25 MG tablet Take 12.5 mg by mouth at bedtime.       . traMADol (ULTRAM) 50 MG tablet Take 50 mg by mouth every 6 (six) hours as needed. Maximum dose= 8 tablets per day. PAIN       . warfarin (COUMADIN) 5 MG tablet Take 1 tablet (5 mg total) by mouth daily.  30 tablet  1  . Ropinirole HCl (REQUIP XL) 12 MG TB24 Take 1 tablet by mouth at bedtime.       Marland Kitchen DISCONTD: clonazePAM (KLONOPIN) 0.5 MG tablet Take 0.5 tablets (0.25 mg total) by mouth 2 (two) times daily as needed for anxiety.  30 tablet      No Known Allergies    ROS:  13 systems were reviewed and are notable for significant arthritic pain.  All other review of systems are unremarkable.   Examination:  Filed Vitals:   10/27/11 0952  BP: 120/80  Pulse: 80     In general, well appearing women.  Cardiovascular: The patient has a regular rate and rhythm.  Fundoscopy:  Disks are flat. Vessel caliber within normal limits.  Mental status:   The patient is oriented to person, place and time. Recent and remote memory are intact. Attention span and concentration are normal. Language including repetition, naming, following commands are intact. Fund of knowledge of current and historical events, as well as vocabulary are normal.  Cranial Nerves: Pupils are equally round and reactive to light. Visual fields full to confrontation. Extraocular movements are intact without nystagmus. Facial sensation and muscles of mastication are intact. Muscles of facial expression are symmetric, but  blunted. Hearing intact to bilateral finger rub. Tongue protrusion, uvula, palate midline.  shoulder shrug does appear delayed on left.  Motor:  Increased tone bilaterally, worse on the left.  Decreased amplitudes of FFM left > right.  Mild resting tremor L>R.    5/5 muscle strength bilaterally.  Reflexes:   Biceps  Triceps Brachioradialis Knee Ankle  Right 2+  2+  2+   0 0  Left  2+  2+  2+   0 0  Toes down  Coordination:  Normal finger to nose.  No dysdiadokinesia.  Sensation is decreased to temperature and vibration in the feet.  Gait and Station are mildly wide based. - retropulsion.  Romberg is negative.   Impression/Recs: 1.  Parkinson's Disease - Clearly she appears to have IPD.  It is quite mild, with most of her dysfunction being her tremor.  It does sound like the ropinirole was helping her tremor, but unfortunately it is likely making her vivid dreams worse.  Given she is not taking Sinemet or ropinirole at this time I would advocate keeping her off her dopamine drugs for now, and getting her REM behavior disorder and vivid dreams under control first. 2.  REM BD/ vivid dreams - I am going to tell her to restart her clonazepam at 0.5 mg at night.  If this does not help her REM BD, vivid dreams then I would increase to 0.75 and then 1mg .  She will call me if this does not help.   We will see the patient back in 2 months.  Thank you for having [place] see Angel Greene in consultation.  Feel free to contact me with any questions.  Lupita Raider Modesto Charon, MD [facility], [place] [address] [place], [place] [address] Phone: [phone] Fax: [phone].

## 2011-11-01 ENCOUNTER — Telehealth: Payer: Self-pay | Admitting: Neurology

## 2011-11-01 ENCOUNTER — Other Ambulatory Visit: Payer: Self-pay | Admitting: Neurology

## 2011-11-01 MED ORDER — CARBIDOPA-LEVODOPA 25-100 MG PO TABS: ORAL_TABLET | ORAL | Status: DC

## 2011-11-01 NOTE — Telephone Encounter (Signed)
the shaking is worse because of them stopping her Parkinson's medication -- in particular the Requip.  I would recommend going back to the Sinemet.  We can start with 100/25 mg tabs 1/2 tab upon awakening, at lunch and dinner then in 5 days increase to 1 full tab.  I think this is better than the Requip as it is less likely to exacerbate the hallucinations at night.  They should continue the clonazepam at night as I prescribed(which is for the hallucinations/vivid dreams at night).

## 2011-11-01 NOTE — Telephone Encounter (Signed)
Message left by Mr. Abee on my vm at [phone] am: "Please call me about [facility]. She is not doing well and needs to see [name] today." I called Mr. Canevari at 1:10 pm. He states she was laying down resting currently. He states her shaking is a lot worse; upper extremities. She is taking the Clonazepam at 0.5 mg at night and he reports she is resting better but the shaking is worse. No falls, no difficulty walking. He repeated several times that she needed to be seen today by [name]. I told him that he did not have any open appointments but that I would let him know what was going on. I told him we would be in touch with any additional information. He again states that she needs to be seen.  **[name], please advise. You saw the patient on 10/27/11. Thank you.

## 2011-11-01 NOTE — Telephone Encounter (Signed)
Called and spoke with the patient and her husband. Information given re: restarting Sinemet. They asked that new script go to [facility]. Instructed them to call if further problems. Mr. Oravec reports her "shaking" is better this afternoon. E-script sent to [facility] as requested.

## 2011-11-03 ENCOUNTER — Encounter: Payer: Self-pay | Admitting: Cardiology

## 2011-11-03 DIAGNOSIS — Z853 Personal history of malignant neoplasm of breast: Secondary | ICD-10-CM | POA: Insufficient documentation

## 2011-11-09 ENCOUNTER — Emergency Department (HOSPITAL_COMMUNITY): Payer: Medicare Other

## 2011-11-09 ENCOUNTER — Emergency Department (HOSPITAL_COMMUNITY)
Admission: EM | Admit: 2011-11-09 | Discharge: 2011-11-09 | Disposition: A | Discharge status: Home / Self Care | Payer: Medicare Other | Attending: Emergency Medicine | Admitting: Emergency Medicine

## 2011-11-09 ENCOUNTER — Encounter (HOSPITAL_COMMUNITY): Payer: Self-pay | Admitting: *Deleted

## 2011-11-09 DIAGNOSIS — I10 Essential (primary) hypertension: Secondary | ICD-10-CM | POA: Insufficient documentation

## 2011-11-09 DIAGNOSIS — Z86718 Personal history of other venous thrombosis and embolism: Secondary | ICD-10-CM | POA: Insufficient documentation

## 2011-11-09 DIAGNOSIS — Z7901 Long term (current) use of anticoagulants: Secondary | ICD-10-CM | POA: Insufficient documentation

## 2011-11-09 DIAGNOSIS — R0602 Shortness of breath: Secondary | ICD-10-CM | POA: Insufficient documentation

## 2011-11-09 DIAGNOSIS — R42 Dizziness and giddiness: Secondary | ICD-10-CM | POA: Insufficient documentation

## 2011-11-09 DIAGNOSIS — I4891 Unspecified atrial fibrillation: Secondary | ICD-10-CM | POA: Insufficient documentation

## 2011-11-09 DIAGNOSIS — Z853 Personal history of malignant neoplasm of breast: Secondary | ICD-10-CM | POA: Insufficient documentation

## 2011-11-09 DIAGNOSIS — R5381 Other malaise: Secondary | ICD-10-CM | POA: Insufficient documentation

## 2011-11-09 DIAGNOSIS — R55 Syncope and collapse: Secondary | ICD-10-CM | POA: Insufficient documentation

## 2011-11-09 DIAGNOSIS — R609 Edema, unspecified: Secondary | ICD-10-CM | POA: Insufficient documentation

## 2011-11-09 LAB — DIFFERENTIAL
Basophils Absolute: 0 10*3/uL (ref 0.0–0.1)
Basophils Relative: 0 % (ref 0–1)
Eosinophils Absolute: 0.2 10*3/uL (ref 0.0–0.7)
Eosinophils Relative: 3 % (ref 0–5)
Lymphs Abs: 2.6 10*3/uL (ref 0.7–4.0)
Neutrophils Relative %: 56 % (ref 43–77)

## 2011-11-09 LAB — POCT I-STAT, CHEM 8
BUN: 26 mg/dL — ABNORMAL HIGH (ref 6–23)
Calcium, Ion: 1.18 mmol/L (ref 1.12–1.32)
Creatinine, Ser: 1.1 mg/dL (ref 0.50–1.10)
Glucose, Bld: 103 mg/dL — ABNORMAL HIGH (ref 70–99)
Hemoglobin: 17 g/dL — ABNORMAL HIGH (ref 12.0–15.0)
Sodium: 140 mEq/L (ref 135–145)
TCO2: 32 mmol/L (ref 0–100)

## 2011-11-09 LAB — URINE MICROSCOPIC-ADD ON

## 2011-11-09 LAB — URINALYSIS, ROUTINE W REFLEX MICROSCOPIC
Bilirubin Urine: NEGATIVE
Hgb urine dipstick: NEGATIVE
Ketones, ur: NEGATIVE mg/dL
Nitrite: NEGATIVE
Protein, ur: NEGATIVE mg/dL
Specific Gravity, Urine: 1.021 (ref 1.005–1.030)
Urobilinogen, UA: 1 mg/dL (ref 0.0–1.0)

## 2011-11-09 LAB — CBC
MCH: 30.7 pg (ref 26.0–34.0)
MCV: 90.4 fL (ref 78.0–100.0)
Platelets: 191 10*3/uL (ref 150–400)
RBC: 5.12 MIL/uL — ABNORMAL HIGH (ref 3.87–5.11)
RDW: 16.4 % — ABNORMAL HIGH (ref 11.5–15.5)
WBC: 8.2 10*3/uL (ref 4.0–10.5)

## 2011-11-09 LAB — PROTIME-INR
INR: 1.64 — ABNORMAL HIGH (ref 0.00–1.49)
Prothrombin Time: 19.7 seconds — ABNORMAL HIGH (ref 11.6–15.2)

## 2011-11-09 MED ORDER — SODIUM CHLORIDE 0.9 % IV SOLN
INTRAVENOUS | Status: DC
  Administered 2011-11-09: 18:00:00 via INTRAVENOUS

## 2011-11-09 MED ORDER — ENOXAPARIN SODIUM 100 MG/ML ~~LOC~~ SOLN
1.0000 mg/kg | Freq: Once | SUBCUTANEOUS | Status: AC
  Administered 2011-11-09: 85 mg via SUBCUTANEOUS
  Filled 2011-11-09: qty 1

## 2011-11-09 NOTE — Discharge Instructions (Signed)
Near-Syncope Near-syncope is sudden weakness, dizziness, or feeling like you might pass out (faint). This may occur when getting up after sitting or while standing for a long period of time. Near-syncope can be caused by a drop in blood pressure. This is a common reaction, but it may occur to a greater degree in people taking medicines to control their blood pressure. Fainting often occurs when the blood pressure or pulse is too low to provide enough blood flow to the brain to keep you conscious. Fainting and near-syncope are not usually due to serious medical problems. However, certain people should be more cautious in the event of near-syncope, including elderly patients, patients with diabetes, and patients with a history of heart conditions (especially irregular rhythms).  CAUSES   Drop in blood pressure.   Physical pain.   Dehydration.   Heat exhaustion.   Emotional distress.   Low blood sugar.   Internal bleeding.   Heart and circulatory problems.   Infections.  SYMPTOMS   Dizziness.   Feeling sick to your stomach (nauseous).   Nearly fainting.   Body numbness.   Turning pale.   Tunnel vision.   Weakness.  HOME CARE INSTRUCTIONS   Lie down right away if you start feeling like you might faint. Breathe deeply and steadily. Wait until all the symptoms have passed. Most of these episodes last only a few minutes. You may feel tired for several hours.   Drink enough fluids to keep your urine clear or pale yellow.   If you are taking blood pressure or heart medicine, get up slowly, taking several minutes to sit and then stand. This can reduce dizziness that is caused by a drop in blood pressure.  SEEK IMMEDIATE MEDICAL CARE IF:   You have a severe headache.   Unusual pain develops in the chest, abdomen, or back.   There is bleeding from the mouth or rectum, or you have black or tarry stool.   An irregular heartbeat or a very rapid pulse develops.   You have  repeated fainting or seizure-like jerking during an episode.   You faint when sitting or lying down.   You develop confusion.   You have difficulty walking.   Severe weakness develops.   Vision problems develop.  MAKE SURE YOU:   Understand these instructions.   Will watch your condition.   Will get help right away if you are not doing well or get worse.  Document Released: 06/21/2005 Document Revised: 06/10/2011 Document Reviewed: 08/07/2010 [employer] Patient Information 2012 [employer]. 

## 2011-11-09 NOTE — ED Notes (Signed)
Patient reports she had onset of near syncope and dizziness when she stood up to walk in for lunch.  Patient has hx of pe x 2 and right dvt.  Patient reports she has new irregular heart beat as well.  Patient denies chest pain.  Patient with sob with activity

## 2011-11-09 NOTE — ED Notes (Signed)
Pt presents with c/o episode of dizziness today lasting app 3-5 minutes. Pt states she was recently diagnosed with PE and discharged from hospital app 1 week ago. Pt reports episode today was similar to symptoms that caused her to come in prior to PE diagnosis. Pt denies Chest pain SOB. Husband at bedside. MD at bedside for eval

## 2011-11-09 NOTE — ED Notes (Signed)
Pt given discharge and follow up instructions without further questions after speaking with EDP. Pt ambulates to lobby in NAD

## 2011-11-09 NOTE — ED Provider Notes (Signed)
History     CSN: [id number]  Arrival date & time 11/09/11  1458   First MD Initiated Contact with Patient 11/09/11 1645      Chief Complaint  Patient presents with  . Dizziness  . Near Syncope    (Consider location/radiation/quality/duration/timing/severity/associated sxs/prior treatment) The history is provided by the patient.   patient is a history of A. fib and ulnar embolisms. She recently was in the hospital for approximately 9 days around a month ago. She'll large saddle embolus at that time. She is discharged home. She's done well has been active. Today he was getting out of the car she developed near syncope and dizziness. These are similar symptoms to what she had with first pulmonary. She states she has minimal shortness of breath with normal activity. No chest pain. She also states that she's had worsening of her chronic back pain. She states she has had surgery 3 times. No numbness or weakness. No dysuria.  Past Medical History  Diagnosis Date  . Atrial fibrillation   . Hypertension     hyperlipidemia-  TEE,CARDIOLYTE STRESS 11/12 with LOV  DR TILLEY AND EKG ON CHART  . Peripheral vascular disease     Hx   DVT x 2  2000  . H/O hiatal hernia   . Bladder infection     06/10/11 with treatment by Dr Timothy Lasso  . Arthritis   . History of breast cancer     2000- breast cancer followed by 6 months of chemo, radiation and  PO meds  . History of DVT (deep vein thrombosis) 06/24/2002  . Parkinsonism     Past Surgical History  Procedure Date  . Breast surgery     LUMPECTOMY  WITH AXILLIARY DISSECTION   2000  . Back surgery     2006 - X-Stop spacers placedL3-L4, L4-L5; 2008 - laminectomy and fusion, L3-L5; 2011 Laminectomy and extension of fusion L5-S1  . Tonsillectomy   . Cardiac catheterization     bilateral cataract extraction with IOL  . Cholecystectomy   . Joint replacement     RIGHT HIP REPLACEMENT   1996  . Total knee arthroplasty 06/23/2011    Procedure: TOTAL KNEE  ARTHROPLASTY;  Surgeon: Loanne Drilling;  Location: [facility];  Service: Orthopedics;  Laterality: Right;  . Breast lumpectomy   . Mastectomy   . Breast lumpectomy     No family history on file.  History  Substance Use Topics  . Smoking status: Never Smoker   . Smokeless tobacco: Never Used  . Alcohol Use: No    OB History    Grav Para Term Preterm Abortions TAB SAB Ect Mult Living                  Review of Systems  Constitutional: Negative for activity change and appetite change.  HENT: Negative for neck stiffness.   Eyes: Negative for pain.  Respiratory: Positive for shortness of breath. Negative for chest tightness.   Cardiovascular: Negative for chest pain and leg swelling.  Gastrointestinal: Negative for nausea, vomiting, abdominal pain and diarrhea.  Genitourinary: Negative for flank pain.  Musculoskeletal: Positive for back pain.  Skin: Negative for rash.  Neurological: Positive for dizziness. Negative for weakness, numbness and headaches.  Psychiatric/Behavioral: Negative for behavioral problems.    Allergies  Review of patient's allergies indicates no known allergies.  Home Medications   Current Outpatient Rx  Name Route Sig Dispense Refill  . ACETAMINOPHEN 325 MG PO TABS Oral Take 650 mg  by mouth every 4 (four) hours as needed.    . ASPIRIN EC 81 MG PO TBEC Oral Take 81 mg by mouth daily.    Marland Kitchen CALCIUM CARBONATE-VITAMIN D 600-400 MG-UNIT PO CHEW Oral Chew 1 tablet by mouth daily.    Marland Kitchen CARBIDOPA-LEVODOPA 25-100 MG PO TABS  Take half a tablet three times a day with meals for 5 days then one tablet three times a day with meals. 90 tablet 3  . CLONAZEPAM 0.5 MG PO TABS Oral Take 0.5 mg by mouth at bedtime. For sleep    . FUROSEMIDE 40 MG PO TABS Oral Take 1 tablet (40 mg total) by mouth daily as needed (Significant edema). 30 tablet   . HYDROCHLOROTHIAZIDE 12.5 MG PO TABS Oral Take 1 tablet (12.5 mg total) by mouth daily.    Marland Kitchen METOPROLOL SUCCINATE ER 25 MG PO TB24  Oral Take 0.5 tablets (12.5 mg total) by mouth daily. 15 tablet 5  . OMEGA-3-ACID ETHYL ESTERS 1 G PO CAPS Oral Take 1 capsule (1 g total) by mouth daily.    . QUETIAPINE FUMARATE 25 MG PO TABS Oral Take 12.5 mg by mouth at bedtime.     Marland Kitchen ROPINIROLE HCL ER 12 MG PO TB24 Oral Take 1 tablet by mouth at bedtime.     . TRAMADOL HCL 50 MG PO TABS Oral Take 50 mg by mouth every 6 (six) hours as needed. Maximum dose= 8 tablets per day. PAIN     . WARFARIN SODIUM 5 MG PO TABS Oral Take 1 tablet (5 mg total) by mouth daily. 30 tablet 1    BP 120/60  Pulse 92  Temp(Src) 97.8 F (36.6 C) (Oral)  Resp 15  Ht 5\' 6"  (1.676 m)  Wt 184 lb (83.462 kg)  BMI 29.70 kg/m2  SpO2 95%  Physical Exam  Nursing note and vitals reviewed. Constitutional: She is oriented to person, place, and time. She appears well-developed and well-nourished.  HENT:  Head: Normocephalic and atraumatic.  Eyes: EOM are normal. Pupils are equal, round, and reactive to light.  Neck: Normal range of motion. Neck supple.  Cardiovascular: Normal rate and normal heart sounds.   No murmur heard. Pulmonary/Chest: Effort normal and breath sounds normal. No respiratory distress. She has no wheezes. She has no rales.  Abdominal: Soft. Bowel sounds are normal. She exhibits no distension. There is no tenderness. There is no rebound and no guarding.  Musculoskeletal: Normal range of motion. She exhibits edema.       Mild bilateral  Lower extremity pitting edema without erythema  Neurological: She is alert and oriented to person, place, and time. No cranial nerve deficit.  Skin: Skin is warm and dry.  Psychiatric: She has a normal mood and affect. Her speech is normal.    ED Course  Procedures (including critical care time)  Labs Reviewed  CBC - Abnormal; Notable for the following:    RBC 5.12 (*)    Hemoglobin 15.7 (*)    HCT 46.3 (*)    RDW 16.4 (*)    All other components within normal limits  PROTIME-INR - Abnormal; Notable  for the following:    Prothrombin Time 19.7 (*)    INR 1.64 (*)    All other components within normal limits  URINALYSIS, ROUTINE W REFLEX MICROSCOPIC - Abnormal; Notable for the following:    APPearance HAZY (*)    Leukocytes, UA LARGE (*)    All other components within normal limits  POCT I-STAT, CHEM 8 -  Abnormal; Notable for the following:    Potassium 3.4 (*)    BUN 26 (*)    Glucose, Bld 103 (*)    Hemoglobin 17.0 (*)    HCT 50.0 (*)    All other components within normal limits  URINE MICROSCOPIC-ADD ON - Abnormal; Notable for the following:    Squamous Epithelial / LPF FEW (*)    Bacteria, UA FEW (*)    Casts HYALINE CASTS (*)    All other components within normal limits  DIFFERENTIAL  TROPONIN I  URINE CULTURE   Dg Chest 2 View  11/09/2011  *RADIOLOGY REPORT*  Clinical Data: Weakness, near-syncope, history breast cancer  CHEST - 2 VIEW  Comparison: 10/10/2011  Findings: Upper normal heart size. Tortuous aorta with atherosclerotic calcification. Mediastinal contours and pulmonary vascularity normal. Atelectasis versus scarring at left base. No infiltrate, pleural effusion or pneumothorax. Minimal chronic peribronchial thickening noted. Scoliosis degenerative changes thoracolumbar spine. Question prior left axillary lymph node dissection.  IMPRESSION: Chronic atelectasis versus scarring left base. No acute abnormalities.  Original Report Authenticated By: Lollie Marrow, M.D.     1. Near syncope      Date: 11/09/2011  Rate: 98  Rhythm: atrial fibrillation  QRS Axis: normal  Intervals: afib  ST/T Wave abnormalities: afib  Conduction Disutrbances:afib  Narrative Interpretation:   Old EKG Reviewed: unchanged    MDM  Patient with near syncope and dizziness. It is resolved. She has a recent history of pulmonary embolism. She also has a history of A. fib. Is not hypoxic here. She is subtherapeutic on her Coumadin. After discussion with Dr. Molli Knock and Dr. Timothy Lasso, this is not  a failure of Coumadin since the patient is subtherapeutic. She does not appear to have hemodynamically significant pulmonary embolism. A new CT would not change her management. She was started on Lovenox and will followup with Dr. Timothy Lasso in the office tomorrow. Patient had some bacteria some white cells the urine. A culture is sent to Dr. Timothy Lasso will follow.        Juliet Rude. Rubin Payor, MD 11/09/11 1910

## 2011-11-09 NOTE — ED Notes (Signed)
Pt to xray with tech

## 2011-11-10 LAB — URINE CULTURE: Colony Count: >=100000

## 2011-11-25 ENCOUNTER — Other Ambulatory Visit: Payer: Self-pay | Admitting: Neurological Surgery

## 2011-11-25 ENCOUNTER — Encounter (HOSPITAL_COMMUNITY): Payer: Self-pay | Admitting: Pharmacy Technician

## 2011-11-26 ENCOUNTER — Other Ambulatory Visit: Payer: Self-pay | Admitting: Neurological Surgery

## 2011-11-26 DIAGNOSIS — M545 Low back pain: Secondary | ICD-10-CM

## 2011-12-03 ENCOUNTER — Encounter (HOSPITAL_COMMUNITY): Admission: RE | Payer: Self-pay | Source: Ambulatory Visit

## 2011-12-03 ENCOUNTER — Ambulatory Visit (HOSPITAL_COMMUNITY): Admission: RE | Admit: 2011-12-03 | Payer: Medicare Other | Source: Ambulatory Visit | Admitting: Neurological Surgery

## 2011-12-03 SURGERY — KYPHOPLASTY
Anesthesia: General | Site: Back

## 2011-12-06 ENCOUNTER — Ambulatory Visit
Admission: RE | Admit: 2011-12-06 | Discharge: 2011-12-06 | Disposition: A | Discharge status: Home / Self Care | Payer: Medicare Other | Source: Ambulatory Visit | Attending: Neurological Surgery | Admitting: Neurological Surgery

## 2011-12-06 DIAGNOSIS — M545 Low back pain: Secondary | ICD-10-CM

## 2011-12-13 ENCOUNTER — Inpatient Hospital Stay (HOSPITAL_COMMUNITY)
Admission: AD | Admit: 2011-12-13 | Discharge: 2011-12-17 | DRG: 310 | Disposition: A | Discharge status: Home / Self Care | Payer: Medicare Other | Source: Ambulatory Visit | Attending: Cardiology | Admitting: Cardiology

## 2011-12-13 DIAGNOSIS — G20 Parkinson's disease: Secondary | ICD-10-CM | POA: Diagnosis present

## 2011-12-13 DIAGNOSIS — Z86711 Personal history of pulmonary embolism: Secondary | ICD-10-CM

## 2011-12-13 DIAGNOSIS — Z7901 Long term (current) use of anticoagulants: Secondary | ICD-10-CM

## 2011-12-13 DIAGNOSIS — I4891 Unspecified atrial fibrillation: Principal | ICD-10-CM | POA: Insufficient documentation

## 2011-12-13 DIAGNOSIS — G20A1 Parkinson's disease without dyskinesia, without mention of fluctuations: Secondary | ICD-10-CM | POA: Diagnosis present

## 2011-12-13 DIAGNOSIS — E669 Obesity, unspecified: Secondary | ICD-10-CM | POA: Diagnosis present

## 2011-12-13 DIAGNOSIS — E876 Hypokalemia: Secondary | ICD-10-CM | POA: Diagnosis present

## 2011-12-13 DIAGNOSIS — I10 Essential (primary) hypertension: Secondary | ICD-10-CM | POA: Diagnosis present

## 2011-12-13 DIAGNOSIS — Z9221 Personal history of antineoplastic chemotherapy: Secondary | ICD-10-CM

## 2011-12-13 DIAGNOSIS — Z79899 Other long term (current) drug therapy: Secondary | ICD-10-CM

## 2011-12-13 DIAGNOSIS — Z853 Personal history of malignant neoplasm of breast: Secondary | ICD-10-CM

## 2011-12-13 DIAGNOSIS — E785 Hyperlipidemia, unspecified: Secondary | ICD-10-CM | POA: Diagnosis present

## 2011-12-13 DIAGNOSIS — Z86718 Personal history of other venous thrombosis and embolism: Secondary | ICD-10-CM

## 2011-12-13 DIAGNOSIS — Z96659 Presence of unspecified artificial knee joint: Secondary | ICD-10-CM

## 2011-12-13 DIAGNOSIS — Z923 Personal history of irradiation: Secondary | ICD-10-CM

## 2011-12-13 LAB — COMPREHENSIVE METABOLIC PANEL
ALT: 24 U/L (ref 0–35)
Alkaline Phosphatase: 54 U/L (ref 39–117)
BUN: 23 mg/dL (ref 6–23)
CO2: 24 mEq/L (ref 19–32)
GFR calc Af Amer: 66 mL/min — ABNORMAL LOW (ref >90)
GFR calc non Af Amer: 57 mL/min — ABNORMAL LOW (ref >90)
Glucose, Bld: 107 mg/dL — ABNORMAL HIGH (ref 70–99)
Potassium: 3.7 mEq/L (ref 3.5–5.1)
Sodium: 140 mEq/L (ref 135–145)
Total Bilirubin: 0.4 mg/dL (ref 0.3–1.2)

## 2011-12-13 LAB — DIFFERENTIAL
Basophils Absolute: 0 10*3/uL (ref 0.0–0.1)
Basophils Relative: 1 % (ref 0–1)
Monocytes Absolute: 0.5 10*3/uL (ref 0.1–1.0)
Neutro Abs: 3.2 10*3/uL (ref 1.7–7.7)
Neutrophils Relative %: 53 % (ref 43–77)

## 2011-12-13 LAB — PROTIME-INR: Prothrombin Time: 31 seconds — ABNORMAL HIGH (ref 11.6–15.2)

## 2011-12-13 LAB — BASIC METABOLIC PANEL
BUN: 23 mg/dL (ref 6–23)
Chloride: 102 mEq/L (ref 96–112)
GFR calc Af Amer: 66 mL/min — ABNORMAL LOW (ref >90)
Potassium: 3.7 mEq/L (ref 3.5–5.1)

## 2011-12-13 LAB — CBC
HCT: 40.8 % (ref 36.0–46.0)
MCHC: 33.1 g/dL (ref 30.0–36.0)
Platelets: 215 10*3/uL (ref 150–400)
RDW: 17.8 % — ABNORMAL HIGH (ref 11.5–15.5)

## 2011-12-13 LAB — MAGNESIUM: Magnesium: 2 mg/dL (ref 1.5–2.5)

## 2011-12-13 MED ORDER — WARFARIN - PHARMACIST DOSING INPATIENT: Freq: Every day | Status: DC

## 2011-12-13 MED ORDER — DOFETILIDE 250 MCG PO CAPS
250.0000 ug | ORAL_CAPSULE | Freq: Two times a day (BID) | ORAL | Status: DC
  Administered 2011-12-14 – 2011-12-17 (×7): 250 ug via ORAL
  Filled 2011-12-13 (×10): qty 1

## 2011-12-13 MED ORDER — SODIUM CHLORIDE 0.9 % IJ SOLN
3.0000 mL | Freq: Two times a day (BID) | INTRAMUSCULAR | Status: DC
  Administered 2011-12-14 – 2011-12-17 (×5): 3 mL via INTRAVENOUS

## 2011-12-13 MED ORDER — WARFARIN SODIUM 5 MG PO TABS
5.0000 mg | ORAL_TABLET | Freq: Once | ORAL | Status: AC
  Administered 2011-12-13: 5 mg via ORAL
  Filled 2011-12-13: qty 1

## 2011-12-13 MED ORDER — POTASSIUM CHLORIDE CRYS ER 20 MEQ PO TBCR: 20.0000 meq | EXTENDED_RELEASE_TABLET | Freq: Once | ORAL | Status: DC

## 2011-12-13 MED ORDER — POTASSIUM CHLORIDE CRYS ER 20 MEQ PO TBCR
40.0000 meq | EXTENDED_RELEASE_TABLET | Freq: Once | ORAL | Status: DC
  Filled 2011-12-13 (×2): qty 2

## 2011-12-13 MED ORDER — FUROSEMIDE 40 MG PO TABS
40.0000 mg | ORAL_TABLET | Freq: Every day | ORAL | Status: DC
  Administered 2011-12-13 – 2011-12-17 (×5): 40 mg via ORAL
  Filled 2011-12-13 (×5): qty 1

## 2011-12-13 MED ORDER — TRAMADOL HCL 50 MG PO TABS: 50.0000 mg | ORAL_TABLET | Freq: Four times a day (QID) | ORAL | Status: DC | PRN

## 2011-12-13 MED ORDER — POTASSIUM CHLORIDE CRYS ER 20 MEQ PO TBCR
40.0000 meq | EXTENDED_RELEASE_TABLET | Freq: Once | ORAL | Status: AC
  Administered 2011-12-13: 40 meq via ORAL

## 2011-12-13 MED ORDER — CLONAZEPAM 0.5 MG PO TABS
0.5000 mg | ORAL_TABLET | Freq: Every day | ORAL | Status: DC
  Administered 2011-12-15: 0.5 mg via ORAL
  Filled 2011-12-13: qty 1

## 2011-12-13 MED ORDER — SODIUM CHLORIDE 0.9 % IJ SOLN
3.0000 mL | Freq: Two times a day (BID) | INTRAMUSCULAR | Status: DC
  Administered 2011-12-15 – 2011-12-17 (×4): 3 mL via INTRAVENOUS

## 2011-12-13 MED ORDER — ASPIRIN EC 81 MG PO TBEC
81.0000 mg | DELAYED_RELEASE_TABLET | Freq: Every day | ORAL | Status: DC
  Administered 2011-12-13 – 2011-12-15 (×3): 81 mg via ORAL
  Filled 2011-12-13 (×4): qty 1

## 2011-12-13 MED ORDER — OMEGA-3-ACID ETHYL ESTERS 1 G PO CAPS
1.0000 g | ORAL_CAPSULE | Freq: Every day | ORAL | Status: DC
  Administered 2011-12-14 – 2011-12-17 (×4): 1 g via ORAL
  Filled 2011-12-13 (×5): qty 1

## 2011-12-13 MED ORDER — POTASSIUM CHLORIDE CRYS ER 20 MEQ PO TBCR
40.0000 meq | EXTENDED_RELEASE_TABLET | Freq: Once | ORAL | Status: AC
  Administered 2011-12-13: 40 meq via ORAL
  Filled 2011-12-13: qty 2

## 2011-12-13 MED ORDER — SODIUM CHLORIDE 0.9 % IV SOLN: 250.0000 mL | INTRAVENOUS | Status: DC | PRN

## 2011-12-13 MED ORDER — SODIUM CHLORIDE 0.9 % IJ SOLN: 3.0000 mL | INTRAMUSCULAR | Status: DC | PRN

## 2011-12-13 NOTE — H&P (Signed)
Angel Greene F  Date of visit:  12/13/2011 DOB:  28-Mar-1930    Age:  78 yrs. Medical record number:  [id number]     Account number:  [id number] Primary Care Provider: [name] ____________________________ CURRENT DIAGNOSES  1. Arrhythmia-Atrial Fibrillation  2. Hypertension,Essential (Benign)  3. Personal History Of Malignant Neoplasm Of Breast  4. Personal history of venous thrombosis and embollism  5. Personal History of Pulmonary Embolism  6. Long Term Use Anticoagulant  7. Hyperlipidemia  8. Obesity(BMI30-40)  9. Parkinson's Disease  10. Dyspnea ____________________________ ALLERGIES  NKDA ____________________________ MEDICATIONS  1. aspirin 81 mg tablet, chewable, 1 p.o. daily  2. Caltrate 600 600 mg (1,500 mg) tablet, 1 p.o. daily  3. Fish Oil 1,000 mg capsule, 1 p.o. daily  4. furosemide 40 mg tablet, PRN  5. clonazepam 0.5 mg tablet, 1/2 tab b.i.d.  6. tramadol 50 mg tablet, PRN  7. carbidopa-levodopa 25-100 mg tablet, 1/2 tab tid  8. metoprolol succinate 25 mg tablet extended release 24 hr, 1/2 tab daily  9. warfarin 5 mg tablet, Take as directed ____________________________ HISTORY OF PRESENT ILLNESS  Patient admitted to the hospital to initiate Tikosyn therapy for atrial fibrillation. She has a history of breast cancer, hypertension and obesity and has had paroxysmal atrial fibrillation previously. She had knee replacement surgery and had a pulmonary embolus in April of 2013. He was a massive saddle embolus and she recovered fairly well with this. She has been anticoagulated but has been feeling poorly and was found to be in atrial fibrillation. She remained in atrial fibrillation and has been symptomatic from this. In addition she's had significant low back pain and there is been a consideration of whether she should have surgery however she was deemed to be too high of a risk to discontinue her anticoagulation. After some consideration she is now admitted to the  hospital to initiate Tikosyn therapy and then the cardioversion to see if it would help her symptomatically. She has a significant resting tremor and thus the amiodarone was not felt to be a good option for her. She recently was on HCTZ but this was discontinued in anticipation of going on Tikosyn. She denies angina and has some mild dyspnea. She has no PND or orthopnea and has a mild amount of edema.  She has had a recent rash that is fading and she quit drinking tomato juice to see if this would help. ____________________________ PAST HISTORY  Past Medical Illnesses:  history of breast cancer treated with surgery, radiation and chemo, hypertension, obesity, Parkinson's disease, spinal stenosis, osteoarthritis, hyperlipidemia, history of DVT 2003, history of shingles, history of urosepsis 2011, shingles;  Cardiovascular Illnesses:  atrial fibrillation, cardiomyopathy, pulmonary embolus April 2013;  Surgical Procedures:  cholecystectomy (lap), hip replacement-rt, lumbar spine fusion 2008, L5-S1 decompressioin 2011, breast lumpectomy, knee replacement-rt December 2012;  Cardiology Procedures-Invasive:  no history of prior cardiac procedures;  Cardiology Procedures-Noninvasive:  lexiscan cardiolite November 2012, echocardiogram April 2013;  LVEF of 45% documented via echocardiogram on 10/11/2011     CHADS Score:  2    CHA2DS2-VASC Score:  4 ____________________________ CARDIO-PULMONARY TEST DATES EKG Date:  11/03/2011;  Nuclear Study Date:  06/03/2011;  Echocardiography Date: 10/11/2011;   ____________________________ FAMILY HISTORY Father - age 76,  died of CVA; Mother - died of breast cancer; Brother 1 - died accidently; Brother 2 -  died of heart disease;  ____________________________ SOCIAL HISTORY Alcohol Use:  no alcohol use;  Smoking:  never smoked;  Diet:  regular diet;  Lifestyle:  married and no children;  Exercise:  no regular exercise;  Occupation:  retired Runner, broadcasting/film/video, Masters from [employer];   Residence:  lives with husband;   ____________________________ REVIEW OF SYSTEMS General:  obesity  Integumentary:  Recent rash on trunk that is fading Eyes:  denies diplopia, history of glaucoma or visual problems.  Ears, Nose, Throat, Mouth:  Recent sores on mouth  Respiratory:  dyspnea with exertion  Cardiovascular:  please review HPI  Abdominal:  denies dyspepsia, GI bleeding, constipation, or diarrhea Genitourinary-Female:  no dysuria, urgency, frequency, UTIs, or stress incontinence  Musculoskeletal:  arthritis of the right knee, sciatica, chronic low back pain, lumbar disc disease  Neurological:  tremor  Psychiatric:  denies depession or anxiety. ____________________________ PHYSICAL EXAMINATION VITAL SIGNS  Blood Pressure:  120/80   Pulse:  76/min. Weight:  181.00 lbs. Height:  63"BMI: 32  Constitutional:  Pleasant elderly white female, in no acute distress Skin:  verrous wart on left breast Head:  normocephalic, normal hair pattern, no masses or tenderness Eyes:  EOMS Intact, PERRLA, C and S clear, Funduscopic exam not done. ENT:  ears, nose and throat reveal no gross abnormalities.  Dentition good. Neck:  supple, no masses, thyromegaly, JVD. Carotid pulses are full and equal bilaterally without bruits. Chest:  normal symmetry, clear to auscultation and percussion. Cardiac:  irregularly irregular rhythm, normal S1 and S2, no S3 or S4 Abdomen:  abdomen soft,non-tender, no masses, no hepatospenomegaly, or aneurysm noted Peripheral Pulses:  the femoral,dorsalis pedis, and posterior tibial pulses are full and equal bilaterally with no bruits auscultated. Extremities & Back:   Venous insufficiency changes, 2+edema Neurological:  gait somewhat slowed, grossly normal, and resting tremor. ____________________________ MOST RECENT LIPID PANEL 07/01/04  CHOL TOTL 238 mg/dl, LDL 161 NM, HDL 67 mg/dl, TRIGLYCER 096 mg/dl, ALT 21 u/l, ALK PHOS 72 u/l, CHOL/HDL 3.6 (Calc) and AST 24  u/l ____________________________ IMPRESSIONS/PLAN  1. Persistent atrial fibrillation 2. Recent saddle pulmonary embolus 3. Long-term anticoagulation with warfarin 4. Hypertension 5. Severe spinal stenosis 6. Obesity 7. Hypertension  Recommendations:  She is in admitted to the hospital to initiate Tikosyn therapy. Following initiation of this may consider cardioversion if she fails to convert to sinus rhythm. ____________________________ Cardiology Physician:  Darden Palmer MD [facility]

## 2011-12-13 NOTE — Progress Notes (Addendum)
Pharmacy Consult for Dofetilide (Tikosyn) Iniation and continuation of warfarin Therapy  Admit Complaint: 78 y.o. female admitted 12/13/2011 with atrial fibrillation to be initiated on dofetilide.   Assessment: 78 year old female admitted to [facility] for dofetilide initiation and possible cardioversion for symptom management of her atrial fibrillation. She also has a significant past medical history of dvt in 2003 and a pulmonary embolus earlier this year in April. She is on chronic coumadin for anticoagulation.   Patient Exclusion Criteria: If any screening criteria checked as "Yes", then  patient  should NOT receive dofetilide until criteria item is corrected. If "Yes" please indicate correction plan.  Baseline INR and electrolytes are currently pending.  YES  NO Patient  Exclusion Criteria Correction Plan  []  [x]  Baseline QTc interval is greater than or equal to 440 msec. IF above YES box checked dofetilide contraindicated unless patient has ICD; then may proceed if QTc 500-550 msec or with known ventricular conduction abnormalities may proceed with QTc 550-600 msec. QTc =     []  [x]  Magnesium level is less than 1.8 mEq/l : Last magnesium:  Lab Results  Component Value Date   MG 2.0 12/13/2011         [x]  []  Potassium level is less than 4 mEq/l : Last potassium:  Lab Results  Component Value Date   K 3.7 12/13/2011         []  []  Patient is known or suspected to have a digoxin level greater than 2 ng/ml: Lab Results  Component Value Date   DIGOXIN 0.8 06/25/2011      []  [x]  Creatinine clearance less than 20 ml/min (calculated using Cockcroft-Gault, actual body weight and serum creatinine): Estimated Creatinine Clearance: 51.6 ml/min (by C-G formula based on Cr of 0.92).    []  [x]  Patient has received drugs known to prolong the QT intervals within the last 48 hours(phenothiazines, tricyclics or tetracyclic antidepressants, erythromycin, H-1 antihistamines, cisapride).   []  [x]   Patient received a dose of hydrochlorothiazide (Oretic) alone or in any combination including triamterene (Dyazide, Maxzide) in the last 48 hours.  hctz has been stopped  []  [x]  Patient received a medication known to increase dofetilide plasma concentrations prior to initial dofetilide dose:    Trimethoprim (Primsol, Proloprim) in the last 36 hours   Verapamil (Calan, Verelan) in the last 36 hours or a sustained release dose in the last 72 hours   Megestrol (Megace) in the last 5 days    Cimetidine (Tagamet) in the last 6 hours   Ketoconazole (Nizoral) in the last 24 hours   Itraconazole (Sporanox) in the last 48 hours    Prochlorperazine (Compazine) in the last 36 hours    []  [x]  Patient is known to have a history of torsades de pointes; congenital or acquired long QT syndromes.   []  [x]  Patient has received a Class 1 antiarrhythmic with less than 2 half-lives since last dose.   []  [x]  Patient has received amiodarone therapy in the past 3 months or amiodarone level is greater than 0.3 ng/ml.    Patient has been appropriately anticoagulated with coumadin. INR therapeutic at 2.9. No bleeding issues noted on admission.  Ordering provider was confirmed at [email or web address] if they are not listed on the [facility] Authorized Prescribers list.  Goal of Therapy:  Follow renal function, electrolytes, potential drug interactions, and dose adjustment. Provide education and 1 week supply at discharge.  Plan:  1. Follow up for k replacement  2. Continue with coumadin 5mg  today -  INR daily x3  3.  Initiate dofetilide based on renal function: Select One Calculated CrCl  Dose q12h  []  > 60 ml/min 500 mcg  [x]  40-60 ml/min 250 mcg  []  20-40 ml/min 125 mcg   3. Follow up QTc after the first 5 doses, renal function, electrolytes (K & Mg) daily x 3 days, dose adjustment, success of initiation and facilitate 1 week discharge supply as clinically indicated.  4. Initiate Tikosyn education video (Call  [phone] and ask for video # [phone]).  5. Place Enrollment Form on the chart for discharge supply of dofetilide.   Severiano Gilbert 6:53 PM 12/13/2011  Addendum: K 3.7; repletion ordered and recheck BMET at 2130.  Rolland Porter, Pharm.D., BCPS Clinical Pharmacist Pager: [phone]

## 2011-12-14 ENCOUNTER — Encounter (HOSPITAL_COMMUNITY): Payer: Self-pay | Admitting: *Deleted

## 2011-12-14 LAB — MAGNESIUM: Magnesium: 1.9 mg/dL (ref 1.5–2.5)

## 2011-12-14 LAB — BASIC METABOLIC PANEL
CO2: 30 mEq/L (ref 19–32)
Chloride: 105 mEq/L (ref 96–112)
Creatinine, Ser: 0.89 mg/dL (ref 0.50–1.10)

## 2011-12-14 LAB — PROTIME-INR: Prothrombin Time: 31.7 seconds — ABNORMAL HIGH (ref 11.6–15.2)

## 2011-12-14 LAB — POTASSIUM: Potassium: 4.4 mEq/L (ref 3.5–5.1)

## 2011-12-14 MED ORDER — OFF THE BEAT BOOK
Freq: Once | Status: AC
  Administered 2011-12-14: 20:00:00
  Filled 2011-12-14: qty 1

## 2011-12-14 MED ORDER — WARFARIN SODIUM 2.5 MG PO TABS
2.5000 mg | ORAL_TABLET | Freq: Once | ORAL | Status: AC
  Administered 2011-12-14: 2.5 mg via ORAL
  Filled 2011-12-14: qty 1

## 2011-12-14 MED ORDER — POTASSIUM CHLORIDE CRYS ER 20 MEQ PO TBCR
40.0000 meq | EXTENDED_RELEASE_TABLET | Freq: Once | ORAL | Status: AC
  Administered 2011-12-14: 40 meq via ORAL

## 2011-12-14 NOTE — Progress Notes (Signed)
ANTICOAGULATION CONSULT NOTE - Follow Up Consult  Pharmacy Consult for coumadin Indication: atrial fibrillation and hx DVT/PE  No Known Allergies  Patient Measurements: Height: 5\' 5"  (165.1 cm) Weight: 181 lb (82.1 kg) IBW/kg (Calculated) : 57   Vital Signs: Temp: 97.9 F (36.6 C) (06/11 0452) Temp src: Oral (06/11 0452) BP: 100/65 mmHg (06/11 0452) Pulse Rate: 82  (06/11 0452)  Labs:  Basename 12/14/11 0520 12/13/11 2115 12/13/11 1742  HGB -- -- 13.5  HCT -- -- 40.8  PLT -- -- 215  APTT -- -- --  LABPROT 31.7* -- 31.0*  INR 3.01* -- 2.93*  HEPARINUNFRC -- -- --  CREATININE 0.89 0.93 0.92  CKTOTAL -- -- --  CKMB -- -- --  TROPONINI -- -- --   Estimated Creatinine Clearance: 53.3 ml/min (by C-G formula based on Cr of 0.89).  Medications:  Prescriptions prior to admission  Medication Sig Dispense Refill  . acetaminophen (TYLENOL) 325 MG tablet Take 650 mg by mouth every 4 (four) hours as needed. As needed for pain.      Marland Kitchen aspirin EC 81 MG tablet Take 81 mg by mouth daily.      . Calcium Carbonate-Vitamin D (CALTRATE 600+D) 600-400 MG-UNIT per chew tablet Chew 1 tablet by mouth daily.      . clonazePAM (KLONOPIN) 0.5 MG tablet Take 0.5 mg by mouth at bedtime. For sleep      . furosemide (LASIX) 40 MG tablet Take 40 mg by mouth daily.       Marland Kitchen omega-3 acid ethyl esters (LOVAZA) 1 G capsule Take 1 g by mouth daily.      . QUEtiapine (SEROQUEL) 25 MG tablet Take 12.5 mg by mouth at bedtime as needed. As needed for sleep.      . traMADol (ULTRAM) 50 MG tablet Take 50 mg by mouth every 6 (six) hours as needed. Maximum dose= 8 tablets per day. PAIN       . warfarin (COUMADIN) 5 MG tablet Take 5 mg by mouth daily at 6 PM.        Assessment: 82 yof on chronic coumadin therapy for afib and history of DVT. INR today is slightly elevated at 3.01. No bleeding noted. No CBC today.   Tikosyn to start today. After supplementation, potassium is 4.4. Baseline QTc WNL.   Goal of  Therapy:  INR 2-3 Monitor platelets by anticoagulation protocol: Yes   Plan:  1. Coumadin 2.5mg  PO x 1 tonight 2. F/u AM INR 3. F/u electrolytes, QTc and renal fxn  Marijean Montanye, Drake Leach 12/14/2011,10:23 AM

## 2011-12-14 NOTE — Care Management Note (Signed)
    Page 1 of 1   12/17/2011     10:52:41 AM   CARE MANAGEMENT NOTE 12/17/2011  Patient:  Angel Greene, Angel Greene   Account Number:  [id number]  Date Initiated:  12/14/2011  Documentation initiated by:  SIMMONS,Saturnino Liew  Subjective/Objective Assessment:   ADMITTED WITH AFIB; LIVES AT HOME WITH HUSBAND; AMBULATES WITH CANE/ RW; USES [facility].     Action/Plan:   DISCHARGE PLANNING INITIATED.   Anticipated DC Date:  12/17/2011   Anticipated DC Plan:  HOME/SELF CARE      DC Planning Services  CM consult  Medication Assistance      Choice offered to / List presented to:             Status of service:  Completed, signed off Medicare Important Message given?   (If response is "NO", the following Medicare IM given date fields will be blank) Date Medicare IM given:   Date Additional Medicare IM given:    Discharge Disposition:  HOME/SELF CARE  Per UR Regulation:  Reviewed for med. necessity/level of care/duration of stay  If discussed at Long Length of Stay Meetings, dates discussed:    Comments:  12/14/11  1117  Marylyn Appenzeller SIMMONS RN, BSN [phone] VERIFIED WITH PHARMACIST AT [facility] - [phone] THAT THEY HAVE TIKOSYN #60 IN STOCK; PT WILL NEED A SEPARATE RX FOR 7 DAY SUPPLY OF TIKOSYN AT D/C TO BE SENT TO MAIN PHARMACY; NCM WILL FOLLOW.

## 2011-12-14 NOTE — Progress Notes (Signed)
Subjective:  Feeling fine.  C/o nocturia last night.  Potassium was low and given replacement and begun on Tikosyn this am.  Objective:  Vital Signs in the last 24 hours: BP 100/65  Pulse 82  Temp(Src) 97.9 F (36.6 C) (Oral)  Resp 19  Ht 5\' 5"  (1.651 m)  Wt 82.1 kg (181 lb)  BMI 30.12 kg/m2  SpO2 92%  Physical Exam: Elderly WF in NAD Lungs:  Clear Cardiac:  irregular rhythm, normal S1 and S2, no S3 Extremities: 1+ edema present  Intake/Output from previous day: 06/10 0701 - 06/11 0700 In: 240 [P.O.:240] Out: -  Weight Filed Weights   12/13/11 1710  Weight: 82.1 kg (181 lb)    Lab Results: Basic Metabolic Panel:  Basename 12/14/11 0830 12/14/11 0520 12/13/11 2115  NA -- 144 140  K 4.4 3.9 --  CL -- 105 102  CO2 -- 30 28  GLUCOSE -- 87 112*  BUN -- 20 23  CREATININE -- 0.89 0.93    CBC:  Basename 12/13/11 1742  WBC 6.1  NEUTROABS 3.2  HGB 13.5  HCT 40.8  MCV 92.7  PLT 215    PROTIME: Lab Results  Component Value Date   INR 3.01* 12/14/2011   INR 2.93* 12/13/2011   INR 1.64* 11/09/2011    Telemetry: Atrial fibrillation, QT .40  Assessment/Plan:  1. Atrial fibrillation now Tikosyn loading 2. Hypokalemia repleted 3. Warfarin anticoag  Continue Tikosyn load.  Darden Palmer  MD [facility]  12/14/2011, 12:36 PM

## 2011-12-15 LAB — BASIC METABOLIC PANEL
Calcium: 9.8 mg/dL (ref 8.4–10.5)
Glucose, Bld: 90 mg/dL (ref 70–99)
Potassium: 4 mEq/L (ref 3.5–5.1)
Sodium: 142 mEq/L (ref 135–145)

## 2011-12-15 LAB — MAGNESIUM: Magnesium: 2 mg/dL (ref 1.5–2.5)

## 2011-12-15 MED ORDER — HYDROCORTISONE 1 % EX CREA
1.0000 "application " | TOPICAL_CREAM | Freq: Three times a day (TID) | CUTANEOUS | Status: DC | PRN
  Filled 2011-12-15: qty 28

## 2011-12-15 MED ORDER — POTASSIUM CHLORIDE CRYS ER 20 MEQ PO TBCR
20.0000 meq | EXTENDED_RELEASE_TABLET | Freq: Every day | ORAL | Status: DC
  Administered 2011-12-15 – 2011-12-17 (×4): 20 meq via ORAL
  Filled 2011-12-15 (×3): qty 1
  Filled 2011-12-15: qty 2

## 2011-12-15 MED ORDER — WARFARIN SODIUM 4 MG PO TABS
4.0000 mg | ORAL_TABLET | Freq: Once | ORAL | Status: AC
  Administered 2011-12-15: 4 mg via ORAL
  Filled 2011-12-15: qty 1

## 2011-12-15 MED ORDER — SODIUM CHLORIDE 0.45 % IV SOLN
INTRAVENOUS | Status: DC
  Administered 2011-12-16: 20 mL/h via INTRAVENOUS

## 2011-12-15 NOTE — Progress Notes (Signed)
Pt and husband watched Tikosyn video and Cardioversion video.  Will continue to monitor.  Angel Greene

## 2011-12-15 NOTE — Progress Notes (Signed)
ANTICOAGULATION CONSULT NOTE - Follow Up Consult  Pharmacy Consult:  Coumadin Indication: atrial fibrillation and hx DVT/PE  No Known Allergies  Patient Measurements: Height: 5\' 5"  (165.1 cm) Weight: 181 lb (82.1 kg) IBW/kg (Calculated) : 57    Labs:  Basename 12/15/11 0615 12/14/11 0520 12/13/11 2115 12/13/11 1742  HGB -- -- -- 13.5  HCT -- -- -- 40.8  PLT -- -- -- 215  APTT -- -- -- --  LABPROT 24.4* 31.7* -- 31.0*  INR 2.15* 3.01* -- 2.93*  HEPARINUNFRC -- -- -- --  CREATININE 0.86 0.89 0.93 --  CKTOTAL -- -- -- --  CKMB -- -- -- --  TROPONINI -- -- -- --   Estimated Creatinine Clearance: 55.2 ml/min (by C-G formula based on Cr of 0.86).     Assessment: 80 YOF admitted for Tikosyn initiation.  Renal function, electrolytes, and QTc are appropriate to continue Tikosyn therapy.  She is on chronic Coumadin for Afib and history of DVT.  INR decreased to therapeutic level today; however, concern that it will become subtherapeutic tomorrow.  No bleeding reported.   Goal of Therapy:  INR 2-3 Monitor platelets by anticoagulation protocol: Yes     Plan:  - Coumadin 4mg  PO today - Daily PT/INR - Continue to monitor electrolytes, QTc, renal fxn     Caili Escalera D. Laney Potash, PharmD, BCPS Pager:  [phone] 12/15/2011, 11:05 AM

## 2011-12-15 NOTE — Progress Notes (Signed)
Pt converted to NSR, Dr. Donnie Aho notified.  Will continue to monitor.  Ninetta Lights

## 2011-12-15 NOTE — Progress Notes (Signed)
Subjective:  Feeling fine.  NO c/o except feeling lazy.  Still in atrial fibrillation.  Objective:  Vital Signs in the last 24 hours: BP 104/70  Pulse 85  Temp 97.6 F (36.4 C) (Oral)  Resp 18  Ht 5\' 5"  (1.651 m)  Wt 82.1 kg (181 lb)  BMI 30.12 kg/m2  SpO2 91%  Physical Exam: Elderly WF in NAD Lungs:  Clear Cardiac:  irregular rhythm, normal S1 and S2, no S3 Extremities: 1+ edema present  Intake/Output from previous day: 06/11 0701 - 06/12 0700 In: 680 [P.O.:680] Out: -  Weight Filed Weights   12/13/11 1710  Weight: 82.1 kg (181 lb)    Lab Results: Basic Metabolic Panel:  Basename 12/15/11 0615 12/14/11 0830 12/14/11 0520  NA 142 -- 144  K 4.0 4.4 --  CL 103 -- 105  CO2 29 -- 30  GLUCOSE 90 -- 87  BUN 20 -- 20  CREATININE 0.86 -- 0.89    CBC:  Basename 12/13/11 1742  WBC 6.1  NEUTROABS 3.2  HGB 13.5  HCT 40.8  MCV 92.7  PLT 215    PROTIME: Lab Results  Component Value Date   INR 2.15* 12/15/2011   INR 3.01* 12/14/2011   INR 2.93* 12/13/2011    Telemetry: Atrial fibrillation, QT .47  Assessment/Plan:  1. Atrial fibrillation now Tikosyn loading 2. Hypokalemia repleted 3. Warfarin anticoag  Continue Tikosyn load. If still in atrial fib tomorrow plan cardioversion.  Cardioversion risks discussed with patient fully and she is agreeable to proceed.  Darden Palmer  MD [facility]  12/15/2011, 9:05 AM

## 2011-12-16 ENCOUNTER — Encounter (HOSPITAL_COMMUNITY)
Admission: AD | Disposition: A | Discharge status: Home / Self Care | Payer: Self-pay | Source: Ambulatory Visit | Attending: Cardiology

## 2011-12-16 LAB — BASIC METABOLIC PANEL
CO2: 28 mEq/L (ref 19–32)
Calcium: 9.6 mg/dL (ref 8.4–10.5)
Chloride: 102 mEq/L (ref 96–112)
Creatinine, Ser: 0.84 mg/dL (ref 0.50–1.10)
Glucose, Bld: 93 mg/dL (ref 70–99)

## 2011-12-16 LAB — PROTIME-INR: INR: 1.9 — ABNORMAL HIGH (ref 0.00–1.49)

## 2011-12-16 SURGERY — CARDIOVERSION
Anesthesia: General

## 2011-12-16 MED ORDER — POTASSIUM CHLORIDE CRYS ER 20 MEQ PO TBCR
20.0000 meq | EXTENDED_RELEASE_TABLET | Freq: Once | ORAL | Status: AC
  Administered 2011-12-16: 20 meq via ORAL
  Filled 2011-12-16: qty 1

## 2011-12-16 MED ORDER — PATIENT'S GUIDE TO USING COUMADIN BOOK
Freq: Once | Status: AC
  Administered 2011-12-16: 17:00:00
  Filled 2011-12-16: qty 1

## 2011-12-16 MED ORDER — POTASSIUM CHLORIDE 20 MEQ/15ML (10%) PO LIQD
40.0000 meq | Freq: Once | ORAL | Status: AC
  Administered 2011-12-16: 40 meq via ORAL
  Filled 2011-12-16 (×2): qty 30

## 2011-12-16 MED ORDER — WARFARIN VIDEO
Freq: Once | Status: AC
  Administered 2011-12-16: 17:00:00

## 2011-12-16 MED ORDER — SPIRONOLACTONE 25 MG PO TABS
25.0000 mg | ORAL_TABLET | Freq: Every day | ORAL | Status: DC
  Administered 2011-12-16 – 2011-12-17 (×2): 25 mg via ORAL
  Filled 2011-12-16 (×2): qty 1

## 2011-12-16 MED ORDER — WARFARIN SODIUM 5 MG PO TABS
5.0000 mg | ORAL_TABLET | Freq: Once | ORAL | Status: AC
  Administered 2011-12-16: 5 mg via ORAL
  Filled 2011-12-16: qty 1

## 2011-12-16 MED ORDER — METOPROLOL TARTRATE 25 MG PO TABS
25.0000 mg | ORAL_TABLET | Freq: Two times a day (BID) | ORAL | Status: DC
  Administered 2011-12-16 – 2011-12-17 (×3): 25 mg via ORAL
  Filled 2011-12-16 (×6): qty 1

## 2011-12-16 NOTE — Progress Notes (Signed)
ANTICOAGULATION CONSULT NOTE - Follow Up Consult  Pharmacy Consult:  Coumadin Indication: atrial fibrillation and hx DVT/PE  No Known Allergies  Patient Measurements: Height: 5\' 5"  (165.1 cm) Weight: 181 lb (82.1 kg) IBW/kg (Calculated) : 57    Labs:  Basename 12/16/11 0524 12/15/11 0615 12/14/11 0520 12/13/11 1742  HGB -- -- -- 13.5  HCT -- -- -- 40.8  PLT -- -- -- 215  APTT -- -- -- --  LABPROT 22.1* 24.4* 31.7* --  INR 1.90* 2.15* 3.01* --  HEPARINUNFRC -- -- -- --  CREATININE 0.84 0.86 0.89 --  CKTOTAL -- -- -- --  CKMB -- -- -- --  TROPONINI -- -- -- --   Estimated Creatinine Clearance: 56.5 ml/min (by C-G formula based on Cr of 0.84).     Assessment: 38 YOF admitted for Tikosyn initiation.  Potassium low today (repletion ordered and spironolactone added), Mg 1.9, and QTc right on border (500 from EKG early AM).  She is on chronic Coumadin for Afib and history of DVT.  INR decreased to slightly subtherapeutic level today.  No bleeding reported.   Goal of Therapy:  INR 2-3 Monitor platelets by anticoagulation protocol: Yes    Plan:  - Coumadin 5mg  PO today per MD order (agreed) - Daily PT/INR - Continue to monitor electrolytes, QTc, renal fxn   Rolland Porter, Pharm.D., BCPS Clinical Pharmacist Pager: [phone] 12/16/2011, 10:07 AM

## 2011-12-16 NOTE — Progress Notes (Signed)
Subjective:  Feeling fine.  Cover ted to NSR last night, but having some PAC's and paroxysms of afib still. Objective:  Vital Signs in the last 24 hours: BP 112/69  Pulse 112  Temp 97.9 F (36.6 C) (Oral)  Resp 19  Ht 5\' 5"  (1.651 m)  Wt 82.1 kg (181 lb)  BMI 30.12 kg/m2  SpO2 91%  Physical Exam: Elderly WF in NAD Lungs:  Clear Cardiac: regular rhythm, normal S1 and S2, no S3 Extremities: 1+ edema present  Intake/Output from previous day: 06/12 0701 - 06/13 0700 In: 843 [P.O.:840; I.V.:3] Out: -  Weight Filed Weights   12/13/11 1710  Weight: 82.1 kg (181 lb)    Lab Results: Basic Metabolic Panel:  Basename 12/16/11 0524 12/15/11 0615  NA 141 142  K 3.5 4.0  CL 102 103  CO2 28 29  GLUCOSE 93 90  BUN 19 20  CREATININE 0.84 0.86    CBC:  Basename 12/13/11 1742  WBC 6.1  NEUTROABS 3.2  HGB 13.5  HCT 40.8  MCV 92.7  PLT 215    PROTIME: Lab Results  Component Value Date   INR 1.90* 12/16/2011   INR 2.15* 12/15/2011   INR 3.01* 12/14/2011    Telemetry: Atrial fibrillation, QT .47  Assessment/Plan:  1. Atrial fibrillation converted to NSR still some a fib and PAC's 2. Hypokalemia repleted 3. Warfarin anticoag under therapuetic  Continue Tikosyn load. Cancel cardioversion. Replete K.  Will add spironolactone to help avoid. Add beta blocker.  Angel Palmer  MD [facility]  12/16/2011, 8:52 AM

## 2011-12-16 NOTE — Plan of Care (Signed)
Problem: Phase I Progression Outcomes Goal: Initial discharge plan identified Outcome: Completed/Met Date Met:  12/16/11 Home with spouse

## 2011-12-17 LAB — BASIC METABOLIC PANEL
BUN: 20 mg/dL (ref 6–23)
CO2: 27 mEq/L (ref 19–32)
Calcium: 9.8 mg/dL (ref 8.4–10.5)
Chloride: 102 mEq/L (ref 96–112)
Creatinine, Ser: 0.91 mg/dL (ref 0.50–1.10)
GFR calc Af Amer: 67 mL/min — ABNORMAL LOW (ref >90)
GFR calc non Af Amer: 58 mL/min — ABNORMAL LOW (ref >90)
Glucose, Bld: 127 mg/dL — ABNORMAL HIGH (ref 70–99)
Potassium: 3.6 mEq/L (ref 3.5–5.1)
Sodium: 141 mEq/L (ref 135–145)

## 2011-12-17 LAB — PROTIME-INR: Prothrombin Time: 21.3 seconds — ABNORMAL HIGH (ref 11.6–15.2)

## 2011-12-17 MED ORDER — POTASSIUM CHLORIDE CRYS ER 20 MEQ PO TBCR: 20.0000 meq | EXTENDED_RELEASE_TABLET | Freq: Every day | ORAL | Status: DC

## 2011-12-17 MED ORDER — FUROSEMIDE 40 MG PO TABS: 40.0000 mg | ORAL_TABLET | Freq: Every day | ORAL | Status: DC

## 2011-12-17 MED ORDER — WARFARIN SODIUM 5 MG PO TABS: 5.0000 mg | ORAL_TABLET | Freq: Every day | ORAL | Status: DC

## 2011-12-17 MED ORDER — SPIRONOLACTONE 25 MG PO TABS: 25.0000 mg | ORAL_TABLET | Freq: Every day | ORAL | Status: DC

## 2011-12-17 MED ORDER — FUROSEMIDE 20 MG PO TABS: 20.0000 mg | ORAL_TABLET | Freq: Every day | ORAL | Status: DC

## 2011-12-17 MED ORDER — FUROSEMIDE 40 MG PO TABS: 20.0000 mg | ORAL_TABLET | Freq: Every day | ORAL | Status: DC

## 2011-12-17 MED ORDER — POTASSIUM CHLORIDE 20 MEQ/15ML (10%) PO LIQD
40.0000 meq | Freq: Once | ORAL | Status: AC
  Administered 2011-12-17: 40 meq via ORAL
  Filled 2011-12-17: qty 30

## 2011-12-17 MED ORDER — DOFETILIDE 250 MCG PO CAPS: 250.0000 ug | ORAL_CAPSULE | Freq: Two times a day (BID) | ORAL | Status: DC

## 2011-12-17 MED ORDER — METOPROLOL TARTRATE 25 MG PO TABS: 25.0000 mg | ORAL_TABLET | Freq: Two times a day (BID) | ORAL | Status: AC

## 2011-12-17 NOTE — Progress Notes (Signed)
Subjective:  No c/o.  Has been in sinus with PAC's  Objective:  Vital Signs in the last 24 hours: BP 103/65  Pulse 79  Temp 97.1 F (36.2 C) (Oral)  Resp 16  Ht 5\' 5"  (1.651 m)  Wt 82.1 kg (181 lb)  BMI 30.12 kg/m2  SpO2 92%  Physical Exam: Elderly WF in NAD Lungs:  Clear Cardiac: regular rhythm, normal S1 and S2, no S3 Extremities:No edema present  Intake/Output from previous day: 06/13 0701 - 06/14 0700 In: 240 [P.O.:240] Out: -  Weight Filed Weights   12/13/11 1710  Weight: 82.1 kg (181 lb)    Lab Results: Basic Metabolic Panel:  Basename 12/16/11 0524 12/15/11 0615  NA 141 142  K 3.5 4.0  CL 102 103  CO2 28 29  GLUCOSE 93 90  BUN 19 20  CREATININE 0.84 0.86    CBC: No results found for this basename: WBC:2,NEUTROABS:2,HGB:2,HCT:2,MCV:2,PLT:2 in the last 72 hours  PROTIME: Lab Results  Component Value Date   INR 1.90* 12/16/2011   INR 2.15* 12/15/2011   INR 3.01* 12/14/2011    Telemetry: Sinus rhythm with PAC's  Assessment/Plan:  1. Atrial fibrillation with conversion to sinus with PAC's 2. Hypokalemia repleted 3. Warfarin anticoag pending  Check lab and walk in hall, dk/c home if other values OK.  Darden Palmer  MD [facility]  12/17/2011, 8:22 AM

## 2011-12-17 NOTE — Discharge Summary (Signed)
Physician Discharge Summary  Patient ID: Angel Greene MRN: [id number] DOB/AGE: [birth year] 78 y.o.  Admit date: 12/13/2011 Discharge date: 12/17/2011  Primary Physician: Dr. Creola Corn  Primary Discharge Diagnosis: 1. Atrial fibrillation converted to sinus rhythm on Tikosyn  Secondary Discharge Diagnosis: 2. Recent pulmonary embolus 3. Long-term anticoagulation with warfarin 4. Hypertension 5. Obesity 6. Severe spinal stenosis 7. Hypokinemia  Hospital Course: This 78 year old female was admitted to the hospital to initiate Tikosyn therapy for atrial fibrillation. She has a prior history of paroxysmal atrial fibrillation and recently had a large pulmonary embolus and was treated medically. She then developed atrial fibrillation and has significant difficulty with fatigue as well as edema. She continued in atrial fibrillation as an outpatient was quite symptomatic and a decision was made to initiate Tikosyn therapy and monitoring followed by cardioversion if necessary.  The patient was initially admitted and was found to be mildly hypokalemic on admission. After she received supplemental potassium Tikosyn was initiated. After the third dose of Tikosyn she converted to sinus rhythm and then had some paroxysms of atrial fibrillation. She continued to have a somewhat fast heart rate and metoprolol was added to her regimen. She was ambulatory in the hall following this and was feeling much better. She did note some diarrhea in the hospital and also became hypokalemic again. Additional potassium supplementation was given. She was clinically feeling much better and in sinus rhythm on the time of discharge. Her QTC was 0.48 time of discharge. She was given education in regards to Tikosyn and spironolactone was added to help her retain potassium. Her furosemide dose be reduced when she goes home and she will also be discharged on potassium.  Discharge Exam: Blood pressure 107/51, pulse 71, temperature  97.1 F (36.2 C), temperature source Oral, resp. rate 18, height 5\' 5"  (1.651 m), weight 82.1 kg (181 lb), SpO2 92.00%.   Lungs clear, no edema present.  Labs: CBC:   Lab Results  Component Value Date   WBC 6.1 12/13/2011   HGB 13.5 12/13/2011   HCT 40.8 12/13/2011   MCV 92.7 12/13/2011   PLT 215 12/13/2011   CMP:  Lab 12/17/11 0834 12/13/11 1742  NA 141 --  K 3.6 --  CL 102 --  CO2 27 --  BUN 20 --  CREATININE 0.91 --  CALCIUM 9.8 --  PROT -- 6.3  BILITOT -- 0.4  ALKPHOS -- 54  ALT -- 24  AST -- 26  GLUCOSE 127* --   Protime: Lab Results  Component Value Date   INR 1.90* 12/16/2011   INR 2.15* 12/15/2011   INR 3.01* 12/14/2011   EKG: Sinus rhythm at discharged with a QTC of 0.48. Possible old inferior infarction, nonspecific changes with T wave inversions laterally.  Discharge Medications:  Angel Greene, Angel Greene  Home Medication Instructions [id number]   Printed on:12/17/11 1326  Medication Information                    traMADol (ULTRAM) 50 MG tablet Take 50 mg by mouth every 6 (six) hours as needed. Maximum dose= 8 tablets per day. PAIN            QUEtiapine (SEROQUEL) 25 MG tablet Take 12.5 mg by mouth at bedtime as needed. As needed for sleep.           Calcium Carbonate-Vitamin D (CALTRATE 600+D) 600-400 MG-UNIT per chew tablet Chew 1 tablet by mouth daily.           acetaminophen (  TYLENOL) 325 MG tablet Take 650 mg by mouth every 4 (four) hours as needed. As needed for pain.           clonazePAM (KLONOPIN) 0.5 MG tablet Take 0.5 mg by mouth at bedtime. For sleep           omega-3 acid ethyl esters (LOVAZA) 1 G capsule Take 1 g by mouth daily.           dofetilide (TIKOSYN) 250 MCG capsule Take 1 capsule (250 mcg total) by mouth every 12 (twelve) hours.           metoprolol tartrate (LOPRESSOR) 25 MG tablet Take 1 tablet (25 mg total) by mouth 2 (two) times daily.           spironolactone (ALDACTONE) 25 MG tablet Take 1 tablet (25 mg total) by mouth  daily.           warfarin (COUMADIN) 5 MG tablet Take 1 tablet (5 mg total) by mouth daily at 6 PM. Take as directed by Dr. Ferd Hibbs office           potassium chloride SA (K-DUR,KLOR-CON) 20 MEQ tablet Take 1 tablet (20 mEq total) by mouth daily.           furosemide (LASIX) 40 MG tablet Take 1/2 tablet daily or as directed             Followup plans and appointments: Her pro time was checked prior to discharge. She will be seen on Monday for a repeat potassium level. She was given additional potassium supplementation while she was in the hospital.  Time spent with patient to include physician time: 45 minutes.  Signed: Darden Palmer. MD [facility] 12/17/2011, 1:26 PM

## 2011-12-17 NOTE — Progress Notes (Signed)
Pt ambulated 200 feet in hallway with walker and RN without any difficulty; pt anticipates d/c home later today; husband in room; will cont. To monitor.

## 2012-01-04 ENCOUNTER — Ambulatory Visit (INDEPENDENT_AMBULATORY_CARE_PROVIDER_SITE_OTHER): Payer: Medicare Other | Admitting: Neurology

## 2012-01-04 ENCOUNTER — Encounter: Payer: Self-pay | Admitting: Neurology

## 2012-01-04 VITALS — BP 106/68 | HR 76 | Wt 177.0 lb

## 2012-01-04 DIAGNOSIS — G20 Parkinson's disease: Secondary | ICD-10-CM

## 2012-01-04 NOTE — Progress Notes (Signed)
Dear Dr. Timothy Lasso,   Thank you for having me see Angel Greene in followup today at [facility] for her problem with Parkinson's disease and REM behavior disorder. As you may recall, she is a 78 y.o. 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. It sounds like her primary symptoms were her mainly left sided tremor. She denies significant changes in her walking, falls, or change in her dexterity. She was treated with Sinemet 100/25 tid. She unfortunately can't tell if this helped her. She does not find the tremor disabling. She was switched to Requip XR 12mg  recently, although she can't remember when. She recently stopped this, along with her Seroquel 12.5mg  qhs and her clonazepam 0.25 bid as she did not think this was helping her symptoms.  Her biggest problems however are her "hallucinations". These actually don't sound like hallucinations but rather vivid dreams that wake her up. She also strikes out and moves during dreaming as well as verbalizing. She carries a diagnosis of REMBD. She denies hallucinations during the day. She says the dreaming starts early in the evening around midnight, after she has gone to be at 10 p.m.She does not get significant lightheadedness. She is not incontinent of urine although has had UTIs.  She does think that her tremor has gotten worse since stopping her Requip, clonazepam and Seroquel. She says she is not feeling as good, but can't describe why.  When she had a recent TKR she had significant problems with hallucinations, confusion during her hospital and rehab stay. It is felt that she should not have another knee replacement given her problems with confusion.   -----------------------------------  At her last visit, as outlined above I decided to focus on her vivid dreams as this  seemed to be causing most of her problems.  I started her back on clonazepam 0.5mg  qhs.  She feels this has helped her vivid dreams and they are no longer a significant problem.  What is confusing is that she is not taking the clonazepam every night but only 2-3 times per week.  She says she takes it only when she doesn't feel relaxed before sleep.  A week after I saw her last her husband called me saying her tremor was worse.  I decided to restart her on Sinemet 100/25 and increase to 1 tid.  For some inexplicable reason she started it then stopped it.  She can't say if it helped.  She doesn't think she got to the full pill tid  She was seen  in the ED since she was here last for presyncopal feeling.  She also was admitted for possible cardioversion for her Afib.  She remains on coumadin for her PEs.  Current Outpatient Prescriptions on File Prior to Visit  Medication Sig Dispense Refill  . acetaminophen (TYLENOL) 325 MG tablet Take 650 mg by mouth every 4 (four) hours as needed. As needed for pain.      . Calcium Carbonate-Vitamin D (CALTRATE 600+D) 600-400 MG-UNIT per chew tablet Chew 1 tablet by mouth daily.      . clonazePAM (KLONOPIN) 0.5 MG tablet Take 0.5 mg by mouth at bedtime. For sleep      . furosemide (LASIX) 40 MG tablet Take 0.5 tablets (20 mg total) by mouth daily. Take 1/2 tablet daily or as directed  30 tablet  12  .  metoprolol tartrate (LOPRESSOR) 25 MG tablet Take 1 tablet (25 mg total) by mouth 2 (two) times daily.  60 tablet  12  . omega-3 acid ethyl esters (LOVAZA) 1 G capsule Take 1 g by mouth daily.      . potassium chloride SA (K-DUR,KLOR-CON) 20 MEQ tablet Take 1 tablet (20 mEq total) by mouth daily.  30 tablet  0  . QUEtiapine (SEROQUEL) 25 MG tablet Take 12.5 mg by mouth at bedtime as needed. As needed for sleep.      Marland Kitchen spironolactone (ALDACTONE) 25 MG tablet Take 1 tablet (25 mg total) by mouth daily.  30 tablet  12  . traMADol (ULTRAM) 50 MG tablet Take 50 mg by mouth  every 6 (six) hours as needed. Maximum dose= 8 tablets per day. PAIN       . warfarin (COUMADIN) 5 MG tablet Take 1 tablet (5 mg total) by mouth daily at 6 PM. Take as directed by Dr. Ferd Hibbs office      . dofetilide (TIKOSYN) 250 MCG capsule Take 1 capsule (250 mcg total) by mouth every 12 (twelve) hours.  60 capsule  12    No Known Allergies  ROS:  13 systems were reviewed and are notable for chronic arthritis.  All other review of systems are unremarkable.  Exam: . Filed Vitals:   01/04/12 0942  BP: 106/68  Pulse: 76  Weight: 177 lb (80.287 kg)    In general, well nourished women.  MMSE 25/27 1 for recall, 1 for WORLD  Masked facies.  Decreased blink.  Bilateral mild coghweeling, resting tremor worse on the right today?  bilateral bradykinesia.  Antalgic gait, turns slowly, varus deviation of the knees.  Impression/Recommendations:  1.  Parkinson's disease - restart the Sinemet 100/25 tid 2.  Vivid dreams - I have encouraged her to use the clonazepam every night to prevent the vivid dreams which were so prominent for her before. 3.  Medication compliance - despite her relatively good MMSE she seems to have significant problems keeping track of her medications.  I have done my best to make it as simple as possible.  The patient will follow up with my colleague Dr. Arbutus Leas who is a movement disorders specialist.  Angel Raider. Modesto Charon, MD [facility], [place]

## 2012-01-28 ENCOUNTER — Encounter: Payer: Self-pay | Admitting: Gastroenterology

## 2012-02-01 ENCOUNTER — Ambulatory Visit: Payer: Medicare Other | Admitting: Neurology

## 2012-02-02 ENCOUNTER — Encounter: Payer: Self-pay | Admitting: Neurology

## 2012-02-02 ENCOUNTER — Ambulatory Visit (INDEPENDENT_AMBULATORY_CARE_PROVIDER_SITE_OTHER): Payer: Medicare Other | Admitting: Neurology

## 2012-02-02 VITALS — BP 128/80 | HR 88 | Wt 176.0 lb

## 2012-02-02 DIAGNOSIS — G20 Parkinson's disease: Secondary | ICD-10-CM

## 2012-02-02 MED ORDER — CARBIDOPA-LEVODOPA 25-100 MG PO TABS: 2.0000 | ORAL_TABLET | Freq: Three times a day (TID) | ORAL | Status: DC

## 2012-02-02 NOTE — Progress Notes (Signed)
Dear Dr. Timothy Lasso,  I saw  Angel Greene back in [place] [facility] clinic for her problem with Parkinson's disease and RBD.  As you may recall, she is a 78 y.o. year old female with a history of multiple lumbar spine surgeries, recent saddle pulmonary embolism, breast cancer who was diagnosed with Parkinson's disease several years ago manifesting as mainly a left sided tremor.    I just saw her 3 weeks ago, but she returns having some question about her medications.  I started her on Sinemet 3 weeks ago and increased it to 100/25 tid.  She is unsure it helps her tremor.  She questions whether it has caused "peach fuzz" on her face, a small rash on the face, stiffness in her neck, and a groove in her tongue.  With respect to the stiffness of her neck she says she doesn't think this is related to the timing of the administration of her Sinemet.  She does say that her tremor is worse before bed, but she takes her last Sinemet with dinner sometime between 6-8 p.m.  Her other doses are 6-8 a.m. and around noon.  She has stopped taking the clonazepam at night that I prescribed for her presumed REM BD and vivid dreams.  She misunderstood thinking it was for sleep.  I explained it was not to put her to sleep but rather to make her sleep better.    She wonders if her vivid dreams are related to the Sinemet.  However, these were there before.  She has had no falls.  She still continues to have chronic back pain -- and is using both fentanyl patch 12.5 ug/h and a local lidocaine patch.   Medical history, social history, and family history were reviewed and have not changed since the last clinic visit.  Current Outpatient Prescriptions on File Prior to Visit  Medication Sig Dispense Refill  . acetaminophen (TYLENOL) 325 MG tablet Take 650 mg by mouth every 4 (four) hours as needed. As needed for pain.      . Calcium Carbonate-Vitamin D (CALTRATE 600+D) 600-400 MG-UNIT per chew tablet Chew 1 tablet by mouth daily.       . clonazePAM (KLONOPIN) 0.5 MG tablet Take 0.5 mg by mouth at bedtime. For sleep      . furosemide (LASIX) 40 MG tablet Take 0.5 tablets (20 mg total) by mouth daily. Take 1/2 tablet daily or as directed  30 tablet  12  . metoprolol tartrate (LOPRESSOR) 25 MG tablet Take 1 tablet (25 mg total) by mouth 2 (two) times daily.  60 tablet  12  . potassium chloride SA (K-DUR,KLOR-CON) 20 MEQ tablet Take 1 tablet (20 mEq total) by mouth daily.  30 tablet  0  . QUEtiapine (SEROQUEL) 25 MG tablet Take 12.5 mg by mouth at bedtime as needed. As needed for sleep.      Marland Kitchen spironolactone (ALDACTONE) 25 MG tablet Take 1 tablet (25 mg total) by mouth daily.  30 tablet  12  . traMADol (ULTRAM) 50 MG tablet Take 50 mg by mouth every 6 (six) hours as needed. Maximum dose= 8 tablets per day. PAIN       . warfarin (COUMADIN) 5 MG tablet Take 1 tablet (5 mg total) by mouth daily at 6 PM. Take as directed by Dr. Ferd Hibbs office      . dofetilide (TIKOSYN) 250 MCG capsule Take 1 capsule (250 mcg total) by mouth every 12 (twelve) hours.  60 capsule  12  .  omega-3 acid ethyl esters (LOVAZA) 1 G capsule Take 1 g by mouth daily.        No Known Allergies  ROS:  13 systems were reviewed and are notable for chronic back pain.  All other review of systems are unremarkable.  Exam: . Filed Vitals:   02/02/12 0942  BP: 128/80  Pulse: 88  Weight: 176 lb (79.833 kg)    In general, well appearing women.   H&N:  no clear paraspinal tenderness over the left neck where she complains of pain,  no clear head deviation.  Impression/Recommendations:  1.  Parkinson's disease - I don't think the Sinemet is causing her multitude of complaints.  It is possible that she is getting a dystonia from the Sinemet causing her neck pain, but I think this is unlikely.  Given she has not noted any improvement in her tremor I will increase her Sinemet to 100/25 tabs 2 tabs tid. 2.  REM BD/vivid dreams - I have instructed her to use the  clonazepam 0.5 mg qhs for vivid dreams and have emphasized that it is not just for when she can't sleep, but should be taken every night.  The patient will follow up with Dr. Arbutus Leas.  Lupita Raider Modesto Charon, MD [facility], [place]

## 2012-02-02 NOTE — Patient Instructions (Signed)
Sinemet 1.5 tablets three times day for 1 week then increase to 2 tablets three times per day.

## 2012-04-11 ENCOUNTER — Encounter: Payer: Self-pay | Admitting: Neurology

## 2012-04-11 ENCOUNTER — Ambulatory Visit: Payer: Medicare Other | Admitting: Neurology

## 2012-04-11 ENCOUNTER — Ambulatory Visit (INDEPENDENT_AMBULATORY_CARE_PROVIDER_SITE_OTHER): Payer: Medicare Other | Admitting: Neurology

## 2012-04-11 VITALS — BP 108/70 | HR 68 | Resp 20 | Wt 171.0 lb

## 2012-04-11 DIAGNOSIS — G20A1 Parkinson's disease without dyskinesia, without mention of fluctuations: Secondary | ICD-10-CM

## 2012-04-11 DIAGNOSIS — G20 Parkinson's disease: Secondary | ICD-10-CM

## 2012-04-11 NOTE — Patient Instructions (Addendum)
1.  Take your carbidopa/levodopa as follows:  2 tablets with each meal (8am, noon, 5 pm) 2.  Try to take the klonopin - 0.5 mg - half tablet at bedtime.  If you still have the vivid dreams, try a full tablet at night 3.  As your cardiologist about safe exercise 4.  Please think about PT and call me if you are willing to schedule 5.  We will send you a letter in 3 months to follow up with Dr. Arbutus Leas in mid February.

## 2012-04-11 NOTE — Progress Notes (Signed)
Angel Greene was seen today in the movement disorders clinic for neurologic consultation at the request of Angel Pounds, MD and [name].  Since [name] is no longer with the practice, I will be resuming the patient's care.  I had the opportunity to review Dr. Nash Greene notes. The consultation is for the evaluation of Parkinson's disease.  The patient reported that she went to her neurosurgeon for f/u for her back and sx's were noted and she was referred to Dr. Anne Greene in 2008-09.  Dr. Anne Greene dx her with PD.  She was started on medication but she does not remember which medication was the first one.  I do not have those notes. She states that her tremor is better but overall lower energy.   Specific Symptoms:  Tremor: L arm more than Right but both shake intermittently. Voice: no change Sleep:   Vivid Dreams:  yes  Acting out dreams:  Yes, screams in sleep.   Wet Pillows: some, but none during day Postural symptoms:  Occasional stumble but feels pretty balanced  Falls?  no Loss of smell:  no Loss of taste:  no Urinary Incontinence:  In the last 2 weeks, some "leakage" Difficulty Swallowing:  no Handwriting, micrographia: yes Trouble with ADL's:  no  Trouble buttoning clothing:  no Depression:  no Memory changes:  No  No longer drives..  Quit driving after knee replacement 1 year ago.    The patient is currently on carbidopa/levodopa 25/100, 2 tablets 2 times per day;  [name] had intended it to be tid but the pt did not do that because she had trouble remembering the midday dose.  She take her medication at 7am and 7 pm.  The patient was on clonazepam 0.5 mg at night for REM behavior disorder but she stopped that stating that it caused "hallucinations."  She has not had real hallucinations but rather vivid dreams and she thought that the klonopin made that worse but her husband stated that it actually seemed to make it better and has been worse since d/c it.  She is tired during the  day.  PREVIOUS MEDICATIONS: Requip (doesn't remember why it was d/c)  ALLERGIES:  No Known Allergies  CURRENT MEDICATIONS:  Current Outpatient Prescriptions on File Prior to Visit  Medication Sig Dispense Refill  . acetaminophen (TYLENOL) 325 MG tablet Take 650 mg by mouth every 4 (four) hours as needed. As needed for pain.      . Calcium Carbonate-Vitamin D (CALTRATE 600+D) 600-400 MG-UNIT per chew tablet Chew 1 tablet by mouth daily.      . carbidopa-levodopa (SINEMET) 25-100 MG per tablet Take 2 tablets by mouth 3 (three) times daily.  180 tablet  6  . clonazePAM (KLONOPIN) 0.5 MG tablet Take 0.5 mg by mouth at bedtime. For sleep      . furosemide (LASIX) 40 MG tablet Take 0.5 tablets (20 mg total) by mouth daily. Take 1/2 tablet daily or as directed  30 tablet  12  . metoprolol tartrate (LOPRESSOR) 25 MG tablet Take 1 tablet (25 mg total) by mouth 2 (two) times daily.  60 tablet  12  . omega-3 acid ethyl esters (LOVAZA) 1 G capsule Take 1 g by mouth daily.      . potassium chloride SA (K-DUR,KLOR-CON) 20 MEQ tablet Take 1 tablet (20 mEq total) by mouth daily.  30 tablet  0  . spironolactone (ALDACTONE) 25 MG tablet Take 1 tablet (25 mg total) by mouth daily.  30 tablet  12  . traMADol (ULTRAM) 50 MG tablet Take 50 mg by mouth every 6 (six) hours as needed. Maximum dose= 8 tablets per day. PAIN       . warfarin (COUMADIN) 5 MG tablet Take 1 tablet (5 mg total) by mouth daily at 6 PM. Take as directed by Dr. Ferd Hibbs office      . dofetilide (TIKOSYN) 250 MCG capsule Take 1 capsule (250 mcg total) by mouth every 12 (twelve) hours.  60 capsule  12    PAST MEDICAL HISTORY:   Past Medical History  Diagnosis Date  . Atrial fibrillation   . Hypertension     hyperlipidemia-  TEE,CARDIOLYTE STRESS 11/12 with LOV  DR TILLEY AND EKG ON CHART  . Peripheral vascular disease     Hx   DVT x 2  2000  . H/O hiatal hernia   . Bladder infection     06/10/11 with treatment by Dr Timothy Lasso  . Arthritis    . History of breast cancer     2000- breast cancer followed by 6 months of chemo, radiation and  PO meds  . History of DVT (deep vein thrombosis) 06/24/2002  . Parkinsonism     PAST SURGICAL HISTORY:   Past Surgical History  Procedure Date  . Breast surgery     LUMPECTOMY  WITH AXILLIARY DISSECTION   2000  . Back surgery     2006 - X-Stop spacers placedL3-L4, L4-L5; 2008 - laminectomy and fusion, L3-L5; 2011 Laminectomy and extension of fusion L5-S1  . Tonsillectomy   . Cardiac catheterization     bilateral cataract extraction with IOL  . Cholecystectomy   . Joint replacement     RIGHT HIP REPLACEMENT   1996  . Total knee arthroplasty 06/23/2011    Procedure: TOTAL KNEE ARTHROPLASTY;  Surgeon: Loanne Drilling;  Location: [facility];  Service: Orthopedics;  Laterality: Right;  . Breast lumpectomy   . Mastectomy   . Breast lumpectomy     SOCIAL HISTORY:   History   Social History  . Marital Status: Married    Spouse Name: N/A    Number of Children: N/A  . Years of Education: N/A   Occupational History  . Retired     [employer], broadcasting/film/video, [employer]   Social History Main Topics  . Smoking status: Never Smoker   . Smokeless tobacco: Never Used  . Alcohol Use: No  . Drug Use: No  . Sexually Active: Not on file   Other Topics Concern  . Not on file   Social History Narrative  . No narrative on file    FAMILY HISTORY:   Family Status  Relation Status Death Age  . Mother Deceased     CA, breast  . Father Deceased     CVA  . Brother Deceased     2, MI    ROS:  A complete 10 system review of systems was obtained and was unremarkable apart from what is mentioned above.  PHYSICAL EXAMINATION:    VITALS:   Filed Vitals:   04/11/12 1354  BP: 108/70  Pulse: 68  Resp: 20  Weight: 171 lb (77.565 kg)    GEN:  The patient appears stated age and is in NAD. HEENT:  Normocephalic, atraumatic.  The mucous membranes are moist. The superficial temporal arteries are without ropiness  or tenderness. CV:  RRR Lungs:  CTAB Neck/HEME:  There are no carotid bruits bilaterally.  Neurological examination:  Orientation: The patient is  alert and oriented x3. Fund of knowledge is appropriate.  Recent and remote memory are intact.  Attention and concentration are normal.    Able to name objects and repeat phrases. Cranial nerves: There is good facial symmetry. Pupils are equal round and reactive to light bilaterally. Fundoscopic exam reveals clear margins bilaterally. Extraocular muscles are intact. The visual fields are full to confrontational testing. The speech is fluent and clear. Soft palate rises symmetrically and there is no tongue deviation. Hearing is intact to conversational tone. Sensation: Sensation is intact to light and pinprick throughout (facial, trunk, extremities). Vibration is decrease at the bilateral big toe. There is no extinction with double simultaneous stimulation. There is no sensory dermatomal level identified. Motor: Strength is 5/5 in the bilateral upper and lower extremities.   Shoulder shrug is equal and symmetric.  There is no pronator drift. Deep tendon reflexes: Deep tendon reflexes are 1/4 at the bilateral biceps, triceps, brachioradialis, and absent at the patella and achilles. Plantar responses are downgoing bilaterally.  Movement examination: Tone: There is mild increased tone in the right upper extremity with activation.  Tone in the left upper tremor he is normal.  The tone in the lower extremities is normal.  Abnormal movements:  There is an intermittent right upper extremity resting tremor.  There is a tremor of the left foot.  Coordination:  There is mild decremation with RAM's Gait and Station: The patient has  significant  difficulty arising out of a deep-seated chair without the use of the hands. The patient's stride length is  decreased with a shuffling gait.  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  She has  stooped posture.  She  has an antalgic gait because of left knee pain.  The patient has a  negative pull test.      ASSESSMENT:  1.  Idiopathic Parkinson's disease.  This is overall mild.    This is evidenced by tremor, bradykinesia, mild rigidity and mild postural instability.   2  REM behavior disorder.  This is commonly associated with Parkinson's disease.    PLAN:   1.  I talked to her about the way that she is currently dosing her carbidopa levodopa.  Essentially, she is sleeping off the last dose before bedtime and is really only getting 1 other dosage throughout the day, which perhaps lasts 3 or 4 hours.  I would like to see her dose the medication with meals, so that she is taking 2 po tid. 2.  I would like to try to get a copy of her previous records from Dr. Anne Greene.  She sign a record release. 3.  patient education was provided today.  Greater than 50% of the visit was spent in counseling discussing pathophysiology, treatment options and prognosis.  Face-to-face visit lasted 70 min.  I gave her an invitation to our PD 101 lecture. 4.  I would like to see her get in some cardiovascular exercise, if it is safe for her to do so.  She is going to discuss this with her cardiologist and I will also send him a copy of our notes today.  Cardiovascular exercise seems to promote dopamine production. 5.  I would like to refer her to our multidisciplinary clinic for our modified LSVT Big Therapy.  She is going to think about that and let me know if she would like to go. 6.  I would like her to try the clonazepam again for the REM behavior disorder.  I  told her to try half of a tablet at night and see if that works.  If not, then she can increase to 1 tablet at night.  If she has difficulty with this she is to let me know.  If he treats the REM behavior disorder well and she still remains excessively fatigued during the day, then she may need a nocturnal polysomnogram. 7. Return in about 4 months (around 08/12/2012).

## 2012-05-26 ENCOUNTER — Emergency Department (HOSPITAL_COMMUNITY)
Admission: EM | Admit: 2012-05-26 | Discharge: 2012-05-26 | Disposition: A | Discharge status: Home / Self Care | Payer: Medicare Other | Attending: Emergency Medicine | Admitting: Emergency Medicine

## 2012-05-26 ENCOUNTER — Encounter (HOSPITAL_COMMUNITY): Payer: Self-pay | Admitting: *Deleted

## 2012-05-26 ENCOUNTER — Emergency Department (HOSPITAL_COMMUNITY): Payer: Medicare Other

## 2012-05-26 DIAGNOSIS — G20A1 Parkinson's disease without dyskinesia, without mention of fluctuations: Secondary | ICD-10-CM | POA: Insufficient documentation

## 2012-05-26 DIAGNOSIS — Z8619 Personal history of other infectious and parasitic diseases: Secondary | ICD-10-CM | POA: Insufficient documentation

## 2012-05-26 DIAGNOSIS — R51 Headache: Secondary | ICD-10-CM

## 2012-05-26 DIAGNOSIS — Z8719 Personal history of other diseases of the digestive system: Secondary | ICD-10-CM | POA: Insufficient documentation

## 2012-05-26 DIAGNOSIS — Z79899 Other long term (current) drug therapy: Secondary | ICD-10-CM | POA: Insufficient documentation

## 2012-05-26 DIAGNOSIS — M129 Arthropathy, unspecified: Secondary | ICD-10-CM | POA: Insufficient documentation

## 2012-05-26 DIAGNOSIS — Z853 Personal history of malignant neoplasm of breast: Secondary | ICD-10-CM | POA: Insufficient documentation

## 2012-05-26 DIAGNOSIS — G20 Parkinson's disease: Secondary | ICD-10-CM | POA: Insufficient documentation

## 2012-05-26 DIAGNOSIS — I10 Essential (primary) hypertension: Secondary | ICD-10-CM | POA: Insufficient documentation

## 2012-05-26 DIAGNOSIS — Z8679 Personal history of other diseases of the circulatory system: Secondary | ICD-10-CM | POA: Insufficient documentation

## 2012-05-26 DIAGNOSIS — I739 Peripheral vascular disease, unspecified: Secondary | ICD-10-CM | POA: Insufficient documentation

## 2012-05-26 DIAGNOSIS — I82409 Acute embolism and thrombosis of unspecified deep veins of unspecified lower extremity: Secondary | ICD-10-CM | POA: Insufficient documentation

## 2012-05-26 DIAGNOSIS — R002 Palpitations: Secondary | ICD-10-CM

## 2012-05-26 LAB — CBC WITH DIFFERENTIAL/PLATELET
Eosinophils Relative: 3 % (ref 0–5)
HCT: 43.6 % (ref 36.0–46.0)
Lymphocytes Relative: 38 % (ref 12–46)
Lymphs Abs: 2.4 10*3/uL (ref 0.7–4.0)
MCV: 95.6 fL (ref 78.0–100.0)
Platelets: 249 10*3/uL (ref 150–400)
RBC: 4.56 MIL/uL (ref 3.87–5.11)
WBC: 6.2 10*3/uL (ref 4.0–10.5)

## 2012-05-26 LAB — BASIC METABOLIC PANEL
CO2: 29 mEq/L (ref 19–32)
Calcium: 9.9 mg/dL (ref 8.4–10.5)
Glucose, Bld: 94 mg/dL (ref 70–99)
Potassium: 4.1 mEq/L (ref 3.5–5.1)
Sodium: 139 mEq/L (ref 135–145)

## 2012-05-26 LAB — PROTIME-INR
INR: 2.11 — ABNORMAL HIGH (ref 0.00–1.49)
Prothrombin Time: 22.8 seconds — ABNORMAL HIGH (ref 11.6–15.2)

## 2012-05-26 LAB — TROPONIN I: Troponin I: <0.3 ng/mL (ref <0.30)

## 2012-05-26 NOTE — ED Notes (Signed)
Patient transported to X-ray 

## 2012-05-26 NOTE — ED Notes (Signed)
Pt states heart gets out of rhythm at times; this am at 230 developed "fluttering" and "discomfort" that has continued; nausea on and off; feel shaking

## 2012-05-26 NOTE — ED Provider Notes (Addendum)
History     CSN: [id number]  Arrival date & time 05/26/12  0602   First MD Initiated Contact with Patient 05/26/12 [phone]      Chief Complaint  Patient presents with  . Palpitations    (Consider location/radiation/quality/duration/timing/severity/associated sxs/prior treatment) HPI 78 year old female presents to the emergency room in complaining of palpitations, chest discomfort nausea, and tightness in head and chest. She reports symptoms started this morning at 2:30 when she got up to go to the bathroom. She is unsure how long the symptoms lasted. They are now resolved. Patient is concerned she may be having a heart attack. She denies any diaphoresis. She has had mild shortness of breath. Patient has past medical history significant for atrophic fibrillation, hypertension, DVT and PE, CHF, breast cancer. Patient reports she does not normally have headaches. The headaches today was across her for an infertility tightness sensation. It has also resolved. She denies any numbness tingling weakness. Husband reports that patient has had some intermittent abdominal pain over the last week but she has been otherwise eating and drinking well and no abdominal pain last 24 hours. Patient is followed by Dr. Timothy Lasso for primary care, and Dr. Donnie Aho for cardiology. Patient had an admission in June for uncontrolled atrial fibrillation, per the notes she had conversion with Tikosyn. Patient is unsure what she takes for afib. She reports followup in Dr. York Spaniel office in the last month. I do not have access to those notes.  Past Medical History  Diagnosis Date  . Atrial fibrillation   . Hypertension     hyperlipidemia-  TEE,CARDIOLYTE STRESS 11/12 with LOV  DR TILLEY AND EKG ON CHART  . Peripheral vascular disease     Hx   DVT x 2  2000  . H/O hiatal hernia   . Bladder infection     06/10/11 with treatment by Dr Timothy Lasso  . Arthritis   . History of breast cancer     2000- breast cancer followed by 6 months  of chemo, radiation and  PO meds  . History of DVT (deep vein thrombosis) 06/24/2002  . Parkinsonism     Past Surgical History  Procedure Date  . Breast surgery     LUMPECTOMY  WITH AXILLIARY DISSECTION   2000  . Back surgery     2006 - X-Stop spacers placedL3-L4, L4-L5; 2008 - laminectomy and fusion, L3-L5; 2011 Laminectomy and extension of fusion L5-S1  . Tonsillectomy   . Cardiac catheterization     bilateral cataract extraction with IOL  . Cholecystectomy   . Joint replacement     RIGHT HIP REPLACEMENT   1996  . Total knee arthroplasty 06/23/2011    Procedure: TOTAL KNEE ARTHROPLASTY;  Surgeon: Loanne Drilling;  Location: [facility];  Service: Orthopedics;  Laterality: Right;  . Breast lumpectomy   . Mastectomy   . Breast lumpectomy     No family history on file.  History  Substance Use Topics  . Smoking status: Never Smoker   . Smokeless tobacco: Never Used  . Alcohol Use: No    OB History    Grav Para Term Preterm Abortions TAB SAB Ect Mult Living                  Review of Systems  See History of Present Illness; otherwise all other systems are reviewed and negative  Allergies  Review of patient's allergies indicates no known allergies.  Home Medications   Current Outpatient Rx  Name  Route  Sig  Dispense  Refill  . ACETAMINOPHEN 325 MG PO TABS   Oral   Take 650 mg by mouth every 4 (four) hours as needed. As needed for pain.         Marland Kitchen CALCIUM CARBONATE-VITAMIN D 600-400 MG-UNIT PO CHEW   Oral   Chew 1 tablet by mouth daily.         Marland Kitchen CARBIDOPA-LEVODOPA 25-100 MG PO TABS   Oral   Take 2 tablets by mouth 3 (three) times daily.   180 tablet   6   . CLONAZEPAM 0.5 MG PO TABS   Oral   Take 0.5 mg by mouth at bedtime. For sleep         . DOFETILIDE 250 MCG PO CAPS   Oral   Take 1 capsule (250 mcg total) by mouth every 12 (twelve) hours.   60 capsule   12   . FENTANYL 12 MCG/HR TD PT72   Transdermal   Place 1 patch onto the skin every 3  (three) days.         . FUROSEMIDE 40 MG PO TABS   Oral   Take 0.5 tablets (20 mg total) by mouth daily. Take 1/2 tablet daily or as directed   30 tablet   12   . LIDOCAINE 5 % EX PTCH   Transdermal   Place 1 patch onto the skin daily. Remove & Discard patch within 12 hours or as directed by MD         . METOPROLOL TARTRATE 25 MG PO TABS   Oral   Take 1 tablet (25 mg total) by mouth 2 (two) times daily.   60 tablet   12   . OMEGA-3-ACID ETHYL ESTERS 1 G PO CAPS   Oral   Take 1 g by mouth daily.         Marland Kitchen POTASSIUM CHLORIDE CRYS ER 20 MEQ PO TBCR   Oral   Take 1 tablet (20 mEq total) by mouth daily.   30 tablet   0   . SPIRONOLACTONE 25 MG PO TABS   Oral   Take 1 tablet (25 mg total) by mouth daily.   30 tablet   12   . TRAMADOL HCL 50 MG PO TABS   Oral   Take 50 mg by mouth every 6 (six) hours as needed. Maximum dose= 8 tablets per day. PAIN          . WARFARIN SODIUM 5 MG PO TABS   Oral   Take 1 tablet (5 mg total) by mouth daily at 6 PM. Take as directed by Dr. Ferd Hibbs office           BP 139/64  Pulse 77  Temp 97.9 F (36.6 C) (Oral)  Resp 16  Ht 5\' 6"  (1.676 m)  Wt 174 lb (78.926 kg)  BMI 28.08 kg/m2  SpO2 99%  Physical Exam  Nursing note and vitals reviewed. Constitutional: She appears well-developed and well-nourished. No distress.       Elderly, frail no acute distress, smiling and pleasant  HENT:  Head: Normocephalic and atraumatic.  Nose: Nose normal.  Mouth/Throat: Oropharynx is clear and moist. No oropharyngeal exudate.  Eyes: Conjunctivae normal and EOM are normal. Pupils are equal, round, and reactive to light.  Neck: Normal range of motion. Neck supple. No JVD present. No tracheal deviation present. No thyromegaly present.  Cardiovascular: Normal heart sounds and intact distal pulses.  Exam reveals no gallop and no friction rub.  No murmur heard.      Slow irregular rhythm  Pulmonary/Chest: Effort normal and breath sounds  normal. No stridor. No respiratory distress. She has no wheezes. She has no rales. She exhibits no tenderness.       Some fine crackles in the bases  Abdominal: Soft. Bowel sounds are normal. She exhibits no distension and no mass. There is no tenderness. There is no rebound and no guarding.  Musculoskeletal: She exhibits edema. She exhibits no tenderness.  Lymphadenopathy:    She has no cervical adenopathy.  Skin: Skin is warm and dry. No rash noted. No erythema. No pallor.    ED Course  Procedures (including critical care time)  Labs Reviewed  BASIC METABOLIC PANEL - Abnormal; Notable for the following:    GFR calc non Af Amer 53 (*)     GFR calc Af Amer 61 (*)     All other components within normal limits  PRO B NATRIURETIC PEPTIDE - Abnormal; Notable for the following:    Pro B Natriuretic peptide (BNP) 1477.0 (*)     All other components within normal limits  PROTIME-INR - Abnormal; Notable for the following:    Prothrombin Time 22.8 (*)     INR 2.11 (*)     All other components within normal limits  CBC WITH DIFFERENTIAL  TROPONIN I   Dg Chest 2 View  05/26/2012  *RADIOLOGY REPORT*  Clinical Data: Mid chest pain this am, states feels like heart fluttering, weakness  CHEST - 2 VIEW  Comparison: 11/09/2011  Findings: Stable scarring at the left lung base.  Atherosclerotic calcification of the aortic arch noted with mild aortic tortuosity. Heart size within normal limits.  Left axillary clips noted.  The symmetry breast tissue may indicate lumpectomy.  Kyphotic angulation at the thoracolumbar junction noted.  Lumbar fixation hardware in place.  This appears stable.  IMPRESSION:  1.  Stable scarring at the left lung base, primarily in the lingula.  No acute findings observed.   Original Report Authenticated By: Gaylyn Rong, M.D.       Date: 05/26/2012  Rate: 57  Rhythm: normal sinus rhythm and premature atrial contractions (PAC)  QRS Axis: normal  Intervals: normal  ST/T  Wave abnormalities: nonspecific T wave changes  Conduction Disutrbances:none  Narrative Interpretation:   Old EKG Reviewed: unchanged    1. Palpitations   2. Headache       MDM  78 year old female with palpitations overnight, history of atrial fibrillation with conversion in June. EKG today shows sinus rhythm with rate in the 60s. Patient currently asymptomatic, but earlier had some chest pressure or shortness of breath and nausea. We'll check baseline labs, chest x-ray and lites called her cardiologist for close followup if workup is negative.        Olivia Mackie, MD 05/26/12 1610  Olivia Mackie, MD 05/26/12 0700  Olivia Mackie, MD 05/26/12 [phone]

## 2012-06-07 ENCOUNTER — Other Ambulatory Visit: Payer: Self-pay

## 2012-06-07 MED ORDER — CARBIDOPA-LEVODOPA 25-100 MG PO TABS: 2.0000 | ORAL_TABLET | Freq: Three times a day (TID) | ORAL | Status: DC

## 2012-07-10 ENCOUNTER — Encounter (HOSPITAL_COMMUNITY): Payer: Self-pay | Admitting: Emergency Medicine

## 2012-07-10 ENCOUNTER — Emergency Department (HOSPITAL_COMMUNITY)
Admission: EM | Admit: 2012-07-10 | Discharge: 2012-07-10 | Disposition: A | Discharge status: Home / Self Care | Payer: Medicare Other | Attending: Emergency Medicine | Admitting: Emergency Medicine

## 2012-07-10 ENCOUNTER — Emergency Department (HOSPITAL_COMMUNITY): Payer: Medicare Other

## 2012-07-10 DIAGNOSIS — Z853 Personal history of malignant neoplasm of breast: Secondary | ICD-10-CM | POA: Insufficient documentation

## 2012-07-10 DIAGNOSIS — E785 Hyperlipidemia, unspecified: Secondary | ICD-10-CM | POA: Insufficient documentation

## 2012-07-10 DIAGNOSIS — G20A1 Parkinson's disease without dyskinesia, without mention of fluctuations: Secondary | ICD-10-CM | POA: Insufficient documentation

## 2012-07-10 DIAGNOSIS — G20 Parkinson's disease: Secondary | ICD-10-CM | POA: Insufficient documentation

## 2012-07-10 DIAGNOSIS — I10 Essential (primary) hypertension: Secondary | ICD-10-CM | POA: Insufficient documentation

## 2012-07-10 DIAGNOSIS — Z8679 Personal history of other diseases of the circulatory system: Secondary | ICD-10-CM | POA: Insufficient documentation

## 2012-07-10 DIAGNOSIS — Z8719 Personal history of other diseases of the digestive system: Secondary | ICD-10-CM | POA: Insufficient documentation

## 2012-07-10 DIAGNOSIS — Z87448 Personal history of other diseases of urinary system: Secondary | ICD-10-CM | POA: Insufficient documentation

## 2012-07-10 DIAGNOSIS — Z79899 Other long term (current) drug therapy: Secondary | ICD-10-CM | POA: Insufficient documentation

## 2012-07-10 DIAGNOSIS — R42 Dizziness and giddiness: Secondary | ICD-10-CM | POA: Insufficient documentation

## 2012-07-10 DIAGNOSIS — Z8739 Personal history of other diseases of the musculoskeletal system and connective tissue: Secondary | ICD-10-CM | POA: Insufficient documentation

## 2012-07-10 DIAGNOSIS — Z86718 Personal history of other venous thrombosis and embolism: Secondary | ICD-10-CM | POA: Insufficient documentation

## 2012-07-10 DIAGNOSIS — Z7901 Long term (current) use of anticoagulants: Secondary | ICD-10-CM | POA: Insufficient documentation

## 2012-07-10 LAB — URINALYSIS, ROUTINE W REFLEX MICROSCOPIC
Glucose, UA: NEGATIVE mg/dL
Glucose, UA: NEGATIVE mg/dL
Hgb urine dipstick: NEGATIVE
Ketones, ur: NEGATIVE mg/dL
Leukocytes, UA: NEGATIVE
Specific Gravity, Urine: 1.026 (ref 1.005–1.030)
Urobilinogen, UA: 1 mg/dL (ref 0.0–1.0)
pH: 5 (ref 5.0–8.0)

## 2012-07-10 LAB — BASIC METABOLIC PANEL
CO2: 30 mEq/L (ref 19–32)
Glucose, Bld: 98 mg/dL (ref 70–99)
Potassium: 4.3 mEq/L (ref 3.5–5.1)
Sodium: 142 mEq/L (ref 135–145)

## 2012-07-10 LAB — CBC WITH DIFFERENTIAL/PLATELET
Lymphocytes Relative: 35 % (ref 12–46)
Lymphs Abs: 2 10*3/uL (ref 0.7–4.0)
Neutro Abs: 3.2 10*3/uL (ref 1.7–7.7)
Neutrophils Relative %: 55 % (ref 43–77)
Platelets: 215 10*3/uL (ref 150–400)
RBC: 4.42 MIL/uL (ref 3.87–5.11)
WBC: 5.9 10*3/uL (ref 4.0–10.5)

## 2012-07-10 LAB — URINE MICROSCOPIC-ADD ON

## 2012-07-10 NOTE — ED Notes (Signed)
Dr. March Rummage at the bedside.

## 2012-07-10 NOTE — ED Notes (Signed)
Pt transported to xray 

## 2012-07-10 NOTE — ED Provider Notes (Signed)
History     CSN: [id number]  Arrival date & time 07/10/12  1214   None     Chief Complaint  Patient presents with  . Dizziness     HPI chief complaint dizziness. Onset 4 months ago. Location: Home. Dizziness not improved or worsened by anything. Severity: Mild. Timing: Intermittent. Context: Dizziness started after she reports starting several  Parkinson's medications. For associated signs and symptoms see review of systems. For social history see nurse's notes. I have reviewed patient's past medical, past surgical, social history as well as medications and allergies  Past Medical History  Diagnosis Date  . Atrial fibrillation   . Hypertension     hyperlipidemia-  TEE,CARDIOLYTE STRESS 11/12 with LOV  DR TILLEY AND EKG ON CHART  . Peripheral vascular disease     Hx   DVT x 2  2000  . H/O hiatal hernia   . Bladder infection     06/10/11 with treatment by Dr Timothy Lasso  . Arthritis   . History of breast cancer     2000- breast cancer followed by 6 months of chemo, radiation and  PO meds  . History of DVT (deep vein thrombosis) 06/24/2002  . Parkinsonism     Past Surgical History  Procedure Date  . Breast surgery     LUMPECTOMY  WITH AXILLIARY DISSECTION   2000  . Back surgery     2006 - X-Stop spacers placedL3-L4, L4-L5; 2008 - laminectomy and fusion, L3-L5; 2011 Laminectomy and extension of fusion L5-S1  . Tonsillectomy   . Cardiac catheterization     bilateral cataract extraction with IOL  . Cholecystectomy   . Joint replacement     RIGHT HIP REPLACEMENT   1996  . Total knee arthroplasty 06/23/2011    Procedure: TOTAL KNEE ARTHROPLASTY;  Surgeon: Loanne Drilling;  Location: [facility];  Service: Orthopedics;  Laterality: Right;  . Breast lumpectomy   . Mastectomy   . Breast lumpectomy     History reviewed. No pertinent family history.  History  Substance Use Topics  . Smoking status: Never Smoker   . Smokeless tobacco: Never Used  . Alcohol Use: No    OB History    Grav Para Term Preterm Abortions TAB SAB Ect Mult Living                  Review of Systems  Constitutional: Negative for fever and chills.  HENT: Negative for hearing loss, trouble swallowing, neck pain, neck stiffness and tinnitus.   Eyes: Negative for photophobia, pain, discharge, itching and visual disturbance.  Respiratory: Negative for cough, chest tightness and shortness of breath.   Cardiovascular: Negative for chest pain, palpitations and leg swelling.  Gastrointestinal: Negative for nausea, vomiting, abdominal pain, diarrhea, constipation and blood in stool.  Genitourinary: Negative for dysuria, urgency, frequency, hematuria, flank pain, decreased urine volume, vaginal bleeding, vaginal discharge, difficulty urinating, vaginal pain and pelvic pain.  Musculoskeletal: Negative for back pain and joint swelling.  Skin: Negative for rash and wound.  Neurological: Positive for dizziness. Negative for tremors, seizures, syncope, facial asymmetry, speech difficulty, weakness, light-headedness, numbness and headaches.  Hematological: Negative for adenopathy. Does not bruise/bleed easily.  Psychiatric/Behavioral: Negative for confusion and decreased concentration.    Allergies  Review of patient's allergies indicates no known allergies.  Home Medications   Current Outpatient Rx  Name  Route  Sig  Dispense  Refill  . ACETAMINOPHEN 325 MG PO TABS   Oral   Take 650 mg  by mouth every 4 (four) hours as needed. As needed for pain.         Marland Kitchen CALCIUM CARBONATE-VITAMIN D 600-400 MG-UNIT PO CHEW   Oral   Chew 1 tablet by mouth daily.         Marland Kitchen CARBIDOPA-LEVODOPA 25-100 MG PO TABS   Oral   Take 2 tablets by mouth 3 (three) times daily.   180 tablet   2   . CLONAZEPAM 0.5 MG PO TABS   Oral   Take 0.5 mg by mouth at bedtime. For sleep         . DOFETILIDE 250 MCG PO CAPS   Oral   Take 250 mcg by mouth 2 (two) times daily.         . FENTANYL 12 MCG/HR TD PT72    Transdermal   Place 1 patch onto the skin every 3 (three) days.         . FUROSEMIDE 40 MG PO TABS   Oral   Take 0.5 tablets (20 mg total) by mouth daily. Take 1/2 tablet daily or as directed   30 tablet   12   . LIDOCAINE 5 % EX PTCH   Transdermal   Place 1 patch onto the skin daily. Remove & Discard patch within 12 hours or as directed by MD         . METOPROLOL TARTRATE 25 MG PO TABS   Oral   Take 1 tablet (25 mg total) by mouth 2 (two) times daily.   60 tablet   12   . OMEGA-3-ACID ETHYL ESTERS 1 G PO CAPS   Oral   Take 1 g by mouth daily.         Marland Kitchen POTASSIUM CHLORIDE CRYS ER 20 MEQ PO TBCR   Oral   Take 1 tablet (20 mEq total) by mouth daily.   30 tablet   0   . TRAMADOL HCL 50 MG PO TABS   Oral   Take 50 mg by mouth every 6 (six) hours as needed. Maximum dose= 8 tablets per day. PAIN          . WARFARIN SODIUM 5 MG PO TABS   Oral   Take 1 tablet (5 mg total) by mouth daily at 6 PM. Take as directed by Dr. Ferd Hibbs office           BP 133/52  Pulse 58  Temp 97.7 F (36.5 C) (Oral)  Resp 16  SpO2 100%  Physical Exam  Constitutional: She is oriented to person, place, and time. She appears well-developed and well-nourished. No distress.  HENT:  Head: Normocephalic and atraumatic.  Eyes: Conjunctivae normal are normal. Right eye exhibits no discharge. Left eye exhibits no discharge. No scleral icterus.  Neck: Normal range of motion. Neck supple.  Cardiovascular: Normal rate, regular rhythm, normal heart sounds and intact distal pulses.   No murmur heard. Pulmonary/Chest: Effort normal and breath sounds normal. No respiratory distress.  Abdominal: Soft. Bowel sounds are normal. She exhibits no distension. There is no tenderness.  Musculoskeletal: Normal range of motion. She exhibits no tenderness.  Neurological: She is alert and oriented to person, place, and time. She has normal strength. No cranial nerve deficit or sensory deficit. Coordination and  gait normal. GCS eye subscore is 4. GCS verbal subscore is 5. GCS motor subscore is 6.       Finger to nose, heel-to-shin and alternating hand movements all within normal limits. Normal gait. No carotid bruits.  Skin: Skin is warm and dry. She is not diaphoretic.  Psychiatric: She has a normal mood and affect.    ED Course  Procedures (including critical care time)  Labs Reviewed  BASIC METABOLIC PANEL - Abnormal; Notable for the following:    GFR calc non Af Amer 49 (*)     GFR calc Af Amer 57 (*)     All other components within normal limits  URINALYSIS, ROUTINE W REFLEX MICROSCOPIC - Abnormal; Notable for the following:    APPearance CLOUDY (*)     Bilirubin Urine SMALL (*)     Leukocytes, UA SMALL (*)     All other components within normal limits  URINE MICROSCOPIC-ADD ON - Abnormal; Notable for the following:    Squamous Epithelial / LPF MANY (*)  MANY   Bacteria, UA MANY (*)  MANY   All other components within normal limits  CBC WITH DIFFERENTIAL  URINE CULTURE   Dg Chest 2 View  07/10/2012  *RADIOLOGY REPORT*  Clinical Data: Dizziness.  Hypertension.  Atrial fibrillation.  CHEST - 2 VIEW  Comparison: 05/26/2012.  Findings: Chronic elevation of the left hemidiaphragm with eventration.  This appears similar to the prior exam of November 2013.  Cardiopericardial silhouette is within normal limits. Mediastinal contours are normal.  There is no airspace disease. Chronic pulmonary parenchymal scarring is present within the lingula. Left axillary dissection clips are present.  IMPRESSION: No active cardiopulmonary disease.  Chronic changes of the chest.   Original Report Authenticated By: Andreas Newport, M.D.      1. Dizzinesses       MDM  Patient is a well-appearing 79 year old female emergency department for 4 months of intermittent dizziness mostly in the mornings after she takes her Parkinson's medications. Dizziness new since starting these medications. The patient's PCP, Dr.  Timothy Lasso had pt sent to ED for basic labs, UA, CXR. Patient completely asymptomatic here. No focal neurologic deficits on exam. Testing here unremarkable. Patient should followup with her PCP tomorrow for possible adjustment of her medications. Patient instructed to followup with her gynecologist regarding her chronic uterine prolapse which is unchanged and not bothering the patient at this time.        Consuello Masse, MD 07/11/12 [phone]

## 2012-07-10 NOTE — Discharge Instructions (Signed)
Prolapse  Prolapse means the falling down, bulging, dropping, or drooping of a body part. Organs that commonly prolapse include the rectum, small intestine, bladder, urethra, vagina (birth canal), uterus (womb), and cervix. Prolapse occurs when the ligaments and muscle tissue around the rectum, bladder, and uterus are damaged or weakened.  CAUSES  This happens especially with:  Childbirth. Some women feel pelvic pressure or have trouble holding their urine right after childbirth, because of stretching and tearing of pelvic tissues. This generally gets better with time and the feeling usually goes away, but it may return with aging.  Chronic heavy lifting.  Aging.  Menopause, with loss of estrogen production weakening the pelvic ligaments and muscles.  Past pelvic surgery.  Obesity.  Chronic constipation.  Chronic cough. Prolapse may affect a single organ, or several organs may prolapse at the same time. The front wall of the vagina holds up the bladder. The back wall holds up part of the lower intestine, or rectum. The uterus fills a spot in the middle. All these organs can be involved when the ligaments and muscles around the vagina relax too much. This often gets worse when women stop producing estrogen (menopause). SYMPTOMS  Uncontrolled loss of urine (incontinence) with cough, sneeze, straining, and exercise.  More force may be required to have a bowel movement, due to trapping of the stool.  When part of an organ bulges through the opening of the vagina, there is sometimes a feeling of heaviness or pressure. It may feel as though something is falling out. This sensation increases with coughing or bearing down.  If the organs protrude through the opening of the vagina and rub against the clothing, there may be soreness, ulcers, infection, pain, and bleeding.  Lower back pain.  Pushing in the upper or lower part of the vagina, to pass urine or have a bowel movement.  Problems  having sexual intercourse.  Being unable to insert a tampon or applicator. DIAGNOSIS  Usually, a physical exam is all that is needed to identify the problem. During the examination, you may be asked to cough and strain while lying down, sitting up, and standing up. Your caregiver will determine if more testing is required, such as bladder function tests. Some diagnoses are:  Cystocele: Bulging and falling of the bladder into the top of the vagina.  Rectocele: Part of the rectum bulging into the vagina.  Prolapse of the uterus: The uterus falls or drops into the vagina.  Enterocele: Bulging of the top of the vagina, after a hysterectomy (uterus removal), with the small intestine bulging into the vagina. A hernia in the top of the vagina.  Urethrocele: The urethra (urine carrying tube) bulging into the vagina. TREATMENT  In most cases, prolapse needs to be treated only if it produces symptoms. If the symptoms are interfering with your usual daily or sexual activities, treatment may be necessary. The following are some measures that may be used to treat prolapse.  Estrogen may help elderly women with mild prolapse.  Kegel exercises may help mild cases of prolapse, by strengthening and tightening the muscles of the pelvic floor.  Pessaries are used in women who choose not to, or are unable to, have surgery. A pessary is a doughnut-shaped piece of plastic or rubber that is put into the vagina to keep the organs in place. This device must be fitted by your caregiver. Your caregiver will also explain how to care for yourself with the pessary. If it works well for you,  this may be the only treatment required.  Surgery is often the only form of treatment for more severe prolapses. There are different types of surgery available. You should discuss what the best procedure is for you. If the uterus is prolapsed, it may be removed (hysterectomy) as part of the surgical treatment. Your caregiver will  discuss the risks and benefits with you.  Uterine-vaginal suspension (surgery to hold up the organs) may be used, especially if you want to maintain your fertility. No form of treatment is guaranteed to correct the prolapse or relieve the symptoms. HOME CARE INSTRUCTIONS   Wear a sanitary pad or absorbent product if you have incontinence of urine.  Avoid heavy lifting and straining with exercise and work.  Take over-the-counter pain medicine for minor discomfort.  Try taking estrogen or using estrogen vaginal cream.  Try Kegel exercises or use a pessary, before deciding to have surgery.  Do Kegel exercises after having a baby. SEEK MEDICAL CARE IF:   Your symptoms interfere with your daily activities.  You need medicine to help with the discomfort.  You need to be fitted with a pessary.  You notice bleeding from the vagina.  You think you have ulcers or you notice ulcers on the cervix.  You have an oral temperature above 102 F (38.9 C).  You develop pain or blood with urination.  You have bleeding with a bowel movement.  The symptoms are interfering with your sex life.  You have urinary incontinence that interferes with your daily activities.  You lose urine with sexual intercourse.  You have a chronic cough.  You have chronic constipation. Document Released: 12/26/2002 Document Revised: 09/13/2011 Document Reviewed: 07/06/2009 [facility] Patient Information 2013 [place], [place]. Dizziness Dizziness is a common problem. It is a feeling of unsteadiness or lightheadedness. You may feel like you are about to faint. Dizziness can lead to injury if you stumble or fall. A person of any age group can suffer from dizziness, but dizziness is more common in older adults. CAUSES  Dizziness can be caused by many different things, including:  Middle ear problems.  Standing for too long.  Infections.  An allergic reaction.  Aging.  An emotional response to something,  such as the sight of blood.  Side effects of medicines.  Fatigue.  Problems with circulation or blood pressure.  Excess use of alcohol, medicines, or illegal drug use.  Breathing too fast (hyperventilation).  An arrhythmia or problems with your heart rhythm.  Low red blood cell count (anemia).  Pregnancy.  Vomiting, diarrhea, fever, or other illnesses that cause dehydration.  Diseases or conditions such as Parkinson's disease, high blood pressure (hypertension), diabetes, and thyroid problems.  Exposure to extreme heat. DIAGNOSIS  To find the cause of your dizziness, your caregiver may do a physical exam, lab tests, radiologic imaging scans, or an electrocardiography test (ECG).  TREATMENT  Treatment of dizziness depends on the cause of your symptoms and can vary greatly. HOME CARE INSTRUCTIONS   Drink enough fluids to keep your urine clear or pale yellow. This is especially important in very hot weather. In the elderly, it is also important in cold weather.  If your dizziness is caused by medicines, take them exactly as directed. When taking blood pressure medicines, it is especially important to get up slowly.  Rise slowly from chairs and steady yourself until you feel okay.  In the morning, first sit up on the side of the bed. When this seems okay, stand slowly while  holding onto something until you know your balance is fine.  If you need to stand in one place for a long time, be sure to move your legs often. Tighten and relax the muscles in your legs while standing.  If dizziness continues to be a problem, have someone stay with you for a day or two. Do this until you feel you are well enough to stay alone. Have the person call your caregiver if he or she notices changes in you that are concerning.  Do not drive or use heavy machinery if you feel dizzy.  Do not drink alcohol. SEEK IMMEDIATE MEDICAL CARE IF:   Your dizziness or lightheadedness gets worse.  You feel  nauseous or vomit.  You develop problems with talking, walking, weakness, or using your arms, hands, or legs.  You are not thinking clearly or you have difficulty forming sentences. It may take a friend or family member to determine if your thinking is normal.  You develop chest pain, abdominal pain, shortness of breath, or sweating.  Your vision changes.  You notice any bleeding.  You have side effects from medicine that seems to be getting worse rather than better. MAKE SURE YOU:   Understand these instructions.  Will watch your condition.  Will get help right away if you are not doing well or get worse. Document Released: 12/15/2000 Document Revised: 09/13/2011 Document Reviewed: 01/08/2011 [facility] Patient Information 2013 [place], [place].

## 2012-07-10 NOTE — ED Notes (Signed)
Do the in and out cath on patient yellow urine in return

## 2012-07-10 NOTE — ED Notes (Signed)
Pt sts sent here by PCP for eval of uterine prolapse and increasing dizziness with some change in medicine; pt alert at present and sts some pelvic discomfort

## 2012-07-11 LAB — URINE CULTURE

## 2012-07-16 NOTE — ED Provider Notes (Signed)
I saw and evaluated the patient, reviewed the resident's note and I agree with the findings and plan.  Faun Mcqueen T Luretha Eberly, MD 07/16/12 [id number] 

## 2012-07-24 ENCOUNTER — Ambulatory Visit (INDEPENDENT_AMBULATORY_CARE_PROVIDER_SITE_OTHER): Payer: Medicare Other | Admitting: Neurology

## 2012-07-24 ENCOUNTER — Encounter: Payer: Self-pay | Admitting: Neurology

## 2012-07-24 VITALS — BP 108/64 | HR 80 | Temp 97.8°F | Resp 18 | Wt 166.0 lb

## 2012-07-24 DIAGNOSIS — R441 Visual hallucinations: Secondary | ICD-10-CM

## 2012-07-24 DIAGNOSIS — R110 Nausea: Secondary | ICD-10-CM

## 2012-07-24 DIAGNOSIS — G20 Parkinson's disease: Secondary | ICD-10-CM

## 2012-07-24 DIAGNOSIS — G4752 REM sleep behavior disorder: Secondary | ICD-10-CM

## 2012-07-24 DIAGNOSIS — H5316 Psychophysical visual disturbances: Secondary | ICD-10-CM

## 2012-07-24 NOTE — Patient Instructions (Addendum)
1.  Here is the recipe for constipation:    - 2.  Try to spread your levodopa out more.  I am okay with the 2 in the AM but would like you to try to spread the other 2 out throughout the day (1 before breakfast and one before dinner) 3.  Add entacapone (comtan) - one pill to each dose of levodopa 4.  Check to see if you are on the clonazepam (klonopin) at night and call me and let me know 5.  Call me with problems 6.  We will contact you for your four month follow up.

## 2012-07-24 NOTE — Progress Notes (Signed)
Angel Greene was seen today in the movement disorders clinic for f/u re: PD.  [name] notes were reviewed.  The patient reported that she went to her neurosurgeon for f/u for her back and sx's were noted and she was referred to [name] in 2008-09.  [name] dx her with PD.  She has been on requip xl - 12 mg in the past.  She is now supposed to be on carbidopa/levodopa 25/100, 2 tablets 3 times per day.  She admits that she generally only takes 2 tablets twice per day because she has trouble remembering the third time at a dose.  In addition, she has some nausea that she thinks may be from the medication  She is also supposed to be on klonopin for RBD.  She cannot recall if she is actually taking it.  Since last visit, I did see that the patient went to the emergency room for chest pain.  This was determined noncardiac etiology.  She also went to the emergency room for dizziness, and it was suspected that this was potentially from her Parkinson's medication.  She continues to have intermittent lightheadedness on and off.  This is not necessarily position dependent.  She has had no syncope.  Wearing off:  no  How long before next dose:  n/a Falls:   no N/V:  yes Hallucinations:  yes  visual distortions: no Lightheaded:  yes  Syncope: no Dyskinesia:  no    Specific Symptoms:  Tremor: L arm more than Right but both shake intermittently. Voice: no change Sleep:   Vivid Dreams:  yes  Acting out dreams:  Yes, screams in sleep.   Wet Pillows: some, but none during day Postural symptoms:  Occasional stumble but feels pretty balanced  Falls?  no Loss of smell:  no Loss of taste:  no Urinary Incontinence:  In the last 2 weeks, some "leakage" Difficulty Swallowing:  no Handwriting, micrographia: yes Trouble with ADL's:  no  Trouble buttoning clothing:  no Depression:  no Memory changes:  No  No longer drives..  Quit driving after knee replacement 1 year ago.     PREVIOUS  MEDICATIONS: Requip XL - 12 mg, Lyrica, Pamelor, Seroquel (when with Dr. Thana Farr)  ALLERGIES:  No Known Allergies  CURRENT MEDICATIONS:  Current Outpatient Prescriptions on File Prior to Visit  Medication Sig Dispense Refill  . acetaminophen (TYLENOL) 325 MG tablet Take 650 mg by mouth every 4 (four) hours as needed. As needed for pain.      . Calcium Carbonate-Vitamin D (CALTRATE 600+D) 600-400 MG-UNIT per chew tablet Chew 1 tablet by mouth daily.      . carbidopa-levodopa (SINEMET IR) 25-100 MG per tablet Take 2 tablets by mouth 3 (three) times daily.  180 tablet  2  . clonazePAM (KLONOPIN) 0.5 MG tablet Take 0.5 mg by mouth at bedtime as needed. For sleep      . dofetilide (TIKOSYN) 250 MCG capsule Take 250 mcg by mouth 2 (two) times daily.      . fentaNYL (DURAGESIC - DOSED MCG/HR) 12 MCG/HR Place 1 patch onto the skin every 3 (three) days.      . furosemide (LASIX) 40 MG tablet Take 0.5 tablets (20 mg total) by mouth daily. Take 1/2 tablet daily or as directed  30 tablet  12  . lidocaine (LIDODERM) 5 % Place 1 patch onto the skin daily. Remove & Discard patch within 12 hours or as directed by MD      .  metoprolol tartrate (LOPRESSOR) 25 MG tablet Take 1 tablet (25 mg total) by mouth 2 (two) times daily.  60 tablet  12  . omega-3 acid ethyl esters (LOVAZA) 1 G capsule Take 1 g by mouth daily.      . potassium chloride SA (K-DUR,KLOR-CON) 20 MEQ tablet Take 1 tablet (20 mEq total) by mouth daily.  30 tablet  0  . traMADol (ULTRAM) 50 MG tablet Take 50 mg by mouth every 6 (six) hours as needed. Maximum dose= 8 tablets per day. PAIN       . warfarin (COUMADIN) 5 MG tablet Take 1 tablet (5 mg total) by mouth daily at 6 PM. Take as directed by Dr. Ferd Hibbs office        PAST MEDICAL HISTORY:   Past Medical History  Diagnosis Date  . Atrial fibrillation   . Hypertension     hyperlipidemia-  TEE,CARDIOLYTE STRESS 11/12 with LOV  DR TILLEY AND EKG ON CHART  . Peripheral vascular disease      Hx   DVT x 2  2000  . H/O hiatal hernia   . Bladder infection     06/10/11 with treatment by Dr Timothy Lasso  . Arthritis   . History of breast cancer     2000- breast cancer followed by 6 months of chemo, radiation and  PO meds  . History of DVT (deep vein thrombosis) 06/24/2002  . Parkinsonism     PAST SURGICAL HISTORY:   Past Surgical History  Procedure Date  . Breast surgery     LUMPECTOMY  WITH AXILLIARY DISSECTION   2000  . Back surgery     2006 - X-Stop spacers placedL3-L4, L4-L5; 2008 - laminectomy and fusion, L3-L5; 2011 Laminectomy and extension of fusion L5-S1  . Tonsillectomy   . Cardiac catheterization     bilateral cataract extraction with IOL  . Cholecystectomy   . Joint replacement     RIGHT HIP REPLACEMENT   1996  . Total knee arthroplasty 06/23/2011    Procedure: TOTAL KNEE ARTHROPLASTY;  Surgeon: Loanne Drilling;  Location: [facility];  Service: Orthopedics;  Laterality: Right;  . Breast lumpectomy   . Mastectomy   . Breast lumpectomy     SOCIAL HISTORY:   History   Social History  . Marital Status: Married    Spouse Name: N/A    Number of Children: N/A  . Years of Education: N/A   Occupational History  . Retired     Runner, broadcasting/film/video, [employer]   Social History Main Topics  . Smoking status: Never Smoker   . Smokeless tobacco: Never Used  . Alcohol Use: No  . Drug Use: No  . Sexually Active: Not on file   Other Topics Concern  . Not on file   Social History Narrative  . No narrative on file    FAMILY HISTORY:   Family Status  Relation Status Death Age  . Mother Deceased     CA, breast  . Father Deceased     CVA  . Brother Deceased     2, MI    ROS:  A complete 10 system review of systems was obtained and was unremarkable apart from what is mentioned above.  PHYSICAL EXAMINATION:    VITALS:   There were no vitals filed for this visit.  GEN:  The patient appears stated age and is in NAD. HEENT:  Normocephalic, atraumatic.  The mucous membranes  are moist. The superficial temporal arteries are without ropiness or  tenderness. CV:  RRR Lungs:  CTAB Neck/HEME:  There are no carotid bruits bilaterally.  Neurological examination:  Orientation: The patient is alert and oriented x3. Fund of knowledge is appropriate.  Recent and remote memory are intact.  Attention and concentration are normal.    Able to name objects and repeat phrases. Cranial nerves: There is good facial symmetry. Pupils are equal round and reactive to light bilaterally. Fundoscopic exam reveals clear margins bilaterally. Extraocular muscles are intact. The visual fields are full to confrontational testing. The speech is fluent and clear. Soft palate rises symmetrically and there is no tongue deviation. Hearing is intact to conversational tone. Sensation: Sensation is intact to light and pinprick throughout (facial, trunk, extremities). Vibration is decrease at the bilateral big toe. There is no extinction with double simultaneous stimulation. There is no sensory dermatomal level identified. Motor: Strength is 5/5 in the bilateral upper and lower extremities.   Shoulder shrug is equal and symmetric.  There is no pronator drift. Deep tendon reflexes: Deep tendon reflexes are 1/4 at the bilateral biceps, triceps, brachioradialis, and absent at the patella and achilles. Plantar responses are downgoing bilaterally.  Movement examination: Tone: There is mild increased tone in the right upper extremity with activation.  Tone in the left upper tremor extremity is normal.  The tone in the lower extremities is normal.  Abnormal movements:  There is an intermittent right upper extremity resting tremor.  There is a tremor of the left foot.  Coordination:  There is mild decremation with RAM's Gait and Station: The patient has  significant  difficulty arising out of a deep-seated chair without the use of the hands. The patient's stride length is  decreased with a shuffling gait.  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  She has  stooped posture.  She has an antalgic gait because of left knee pain.  The patient has a  negative pull test.      ASSESSMENT/PLAN: 1.  Idiopathic Parkinson's disease.  This is overall mild.    This is evidenced by tremor, bradykinesia, mild rigidity and mild postural instability.    -She will remain on the carbidopa/levodopa 25/100  but I would like to see her actually try to get in a middle of the day dosage, as we discussed last time.  Perhaps instead of during the 2 tablets twice per day she could take 2 tablets in the morning, one in the afternoon and 1 before dinner.  I am hopeful that this would help with the lightheadedness, nausea and occasional hallucinations.    -I am going to add entacapone 200 mg, one tablet each levodopa dosing (for 3 times a day dosing).  She will call me with any diarrhea.  Risks, benefits, side effects and alternative therapies were discussed.  The opportunity to ask questions was given and they were answered to the best of my ability.  The patient expressed understanding and willingness to follow the outlined treatment protocols. 2  REM behavior disorder.  This is commonly associated with Parkinson's disease.    -I. asked her to check and see if she is actually on the clonazepam and to call me back and let me know. 3.  I once again encouraged physical exercise.  She thought that physical therapy made her arthritis worse.  I talked to her about other types of exercise such as swimming and rowing and stationary bike which would be less of a stress on her joints. 4.  Return in  about 4 months (around 11/21/2012). 5.  Time in room:  40 min  -

## 2012-07-26 ENCOUNTER — Telehealth: Payer: Self-pay | Admitting: Neurology

## 2012-07-26 ENCOUNTER — Other Ambulatory Visit: Payer: Self-pay | Admitting: Neurology

## 2012-07-26 MED ORDER — ENTACAPONE 200 MG PO TABS: ORAL_TABLET | ORAL | Status: DC

## 2012-07-26 NOTE — Telephone Encounter (Signed)
Called and spoke with the patient's husband. Informed him that the "new prescription" (Comtan) was e-scribed to his pharmacy and we discussed how and when to take it. Asked that they call us for additional questions/concerns. He states he will.

## 2012-07-27 ENCOUNTER — Emergency Department (HOSPITAL_COMMUNITY): Payer: Medicare Other

## 2012-07-27 ENCOUNTER — Inpatient Hospital Stay (HOSPITAL_COMMUNITY)
Admission: EM | Admit: 2012-07-27 | Discharge: 2012-07-30 | DRG: 312 | Disposition: A | Discharge status: Home / Self Care | Payer: Medicare Other | Attending: Internal Medicine | Admitting: Internal Medicine

## 2012-07-27 ENCOUNTER — Encounter (HOSPITAL_COMMUNITY): Payer: Self-pay | Admitting: Emergency Medicine

## 2012-07-27 ENCOUNTER — Other Ambulatory Visit: Payer: Self-pay

## 2012-07-27 DIAGNOSIS — G20A1 Parkinson's disease without dyskinesia, without mention of fluctuations: Secondary | ICD-10-CM | POA: Diagnosis present

## 2012-07-27 DIAGNOSIS — I509 Heart failure, unspecified: Secondary | ICD-10-CM | POA: Diagnosis present

## 2012-07-27 DIAGNOSIS — Z7901 Long term (current) use of anticoagulants: Secondary | ICD-10-CM

## 2012-07-27 DIAGNOSIS — I10 Essential (primary) hypertension: Secondary | ICD-10-CM | POA: Diagnosis present

## 2012-07-27 DIAGNOSIS — E785 Hyperlipidemia, unspecified: Secondary | ICD-10-CM | POA: Diagnosis present

## 2012-07-27 DIAGNOSIS — I951 Orthostatic hypotension: Principal | ICD-10-CM | POA: Diagnosis present

## 2012-07-27 DIAGNOSIS — M1990 Unspecified osteoarthritis, unspecified site: Secondary | ICD-10-CM | POA: Diagnosis present

## 2012-07-27 DIAGNOSIS — G8929 Other chronic pain: Secondary | ICD-10-CM | POA: Diagnosis present

## 2012-07-27 DIAGNOSIS — I472 Ventricular tachycardia, unspecified: Secondary | ICD-10-CM | POA: Diagnosis not present

## 2012-07-27 DIAGNOSIS — T428X5A Adverse effect of antiparkinsonism drugs and other central muscle-tone depressants, initial encounter: Secondary | ICD-10-CM | POA: Diagnosis present

## 2012-07-27 DIAGNOSIS — Z86718 Personal history of other venous thrombosis and embolism: Secondary | ICD-10-CM

## 2012-07-27 DIAGNOSIS — I4729 Other ventricular tachycardia: Secondary | ICD-10-CM | POA: Diagnosis not present

## 2012-07-27 DIAGNOSIS — F411 Generalized anxiety disorder: Secondary | ICD-10-CM | POA: Diagnosis present

## 2012-07-27 DIAGNOSIS — I4891 Unspecified atrial fibrillation: Secondary | ICD-10-CM | POA: Diagnosis present

## 2012-07-27 DIAGNOSIS — Z86711 Personal history of pulmonary embolism: Secondary | ICD-10-CM

## 2012-07-27 DIAGNOSIS — I739 Peripheral vascular disease, unspecified: Secondary | ICD-10-CM | POA: Diagnosis present

## 2012-07-27 DIAGNOSIS — Z853 Personal history of malignant neoplasm of breast: Secondary | ICD-10-CM

## 2012-07-27 DIAGNOSIS — Z79899 Other long term (current) drug therapy: Secondary | ICD-10-CM

## 2012-07-27 DIAGNOSIS — N390 Urinary tract infection, site not specified: Secondary | ICD-10-CM | POA: Diagnosis present

## 2012-07-27 DIAGNOSIS — G20 Parkinson's disease: Secondary | ICD-10-CM | POA: Diagnosis present

## 2012-07-27 DIAGNOSIS — Z96649 Presence of unspecified artificial hip joint: Secondary | ICD-10-CM

## 2012-07-27 LAB — BASIC METABOLIC PANEL
CO2: 32 mEq/L (ref 19–32)
Chloride: 98 mEq/L (ref 96–112)
Creatinine, Ser: 0.8 mg/dL (ref 0.50–1.10)
Potassium: 4.3 mEq/L (ref 3.5–5.1)
Sodium: 136 mEq/L (ref 135–145)

## 2012-07-27 LAB — CBC WITH DIFFERENTIAL/PLATELET
Eosinophils Relative: 3 % (ref 0–5)
HCT: 43.1 % (ref 36.0–46.0)
Hemoglobin: 14 g/dL (ref 12.0–15.0)
Lymphocytes Relative: 36 % (ref 12–46)
Lymphs Abs: 2.2 10*3/uL (ref 0.7–4.0)
MCV: 97.3 fL (ref 78.0–100.0)
Monocytes Absolute: 0.5 10*3/uL (ref 0.1–1.0)
RBC: 4.43 MIL/uL (ref 3.87–5.11)
WBC: 6 10*3/uL (ref 4.0–10.5)

## 2012-07-27 LAB — URINALYSIS, ROUTINE W REFLEX MICROSCOPIC
Bilirubin Urine: NEGATIVE
Hgb urine dipstick: NEGATIVE
Nitrite: NEGATIVE
Specific Gravity, Urine: 1.013 (ref 1.005–1.030)
pH: 5.5 (ref 5.0–8.0)

## 2012-07-27 MED ORDER — SODIUM CHLORIDE 0.9 % IV SOLN
INTRAVENOUS | Status: DC
  Administered 2012-07-27: 1000 mL via INTRAVENOUS

## 2012-07-27 MED ORDER — DEXTROSE 5 % IV SOLN
1.0000 g | Freq: Once | INTRAVENOUS | Status: AC
  Administered 2012-07-27: 1 g via INTRAVENOUS
  Filled 2012-07-27: qty 10

## 2012-07-27 NOTE — ED Provider Notes (Signed)
History     CSN: [id number]  Arrival date & time 07/27/12  1746   First MD Initiated Contact with Patient 07/27/12 1909      Chief Complaint  Patient presents with  . Weakness    HPI Pt was seen at 1925.   Per pt, c/o gradual onset and persistence of constant generalized weakness, lightheadedness and near syncope for the past several days.  States her symptoms occur when she gets up to walk.  Pt feels she is urinating more frequently than usual.  Pt also c/o gradual onset and persistence of constant sinus congestion and facial/forehead "pain" for the past several days.  Denies headache, no neck pain, no fevers, no sore throat, no eyes pain.  Denies vertigo, no CP/palpitations, no SOB/cough, no abd pain, no N/V/D, no back pain, no fevers, no focal motor weakness, no tingling/numbness in extremities, no visual changes, no facial droop, no slurred speech, no syncope, no AMS.      Past Medical History  Diagnosis Date  . Atrial fibrillation   . Hypertension     hyperlipidemia-  TEE,CARDIOLYTE STRESS 11/12 with LOV  DR TILLEY AND EKG ON CHART  . Peripheral vascular disease     Hx   DVT x 2  2000  . H/O hiatal hernia   . Bladder infection     06/10/11 with treatment by Dr Timothy Lasso  . Arthritis   . History of breast cancer     2000- breast cancer followed by 6 months of chemo, radiation and  PO meds  . History of DVT (deep vein thrombosis) 06/24/2002  . Parkinsonism     Past Surgical History  Procedure Date  . Breast surgery     LUMPECTOMY  WITH AXILLIARY DISSECTION   2000  . Back surgery     2006 - X-Stop spacers placedL3-L4, L4-L5; 2008 - laminectomy and fusion, L3-L5; 2011 Laminectomy and extension of fusion L5-S1  . Tonsillectomy   . Cardiac catheterization     bilateral cataract extraction with IOL  . Cholecystectomy   . Joint replacement     RIGHT HIP REPLACEMENT   1996  . Total knee arthroplasty 06/23/2011    Procedure: TOTAL KNEE ARTHROPLASTY;  Surgeon: Loanne Drilling;   Location: [facility];  Service: Orthopedics;  Laterality: Right;  . Breast lumpectomy   . Mastectomy   . Breast lumpectomy      History  Substance Use Topics  . Smoking status: Never Smoker   . Smokeless tobacco: Never Used  . Alcohol Use: No    Review of Systems ROS: Statement: All systems negative except as marked or noted in the HPI; Constitutional: Negative for fever and chills.; ; Eyes: Negative for eye pain, redness and discharge. ; ; ENMT: Negative for ear pain, hoarseness, nasal congestion, sore throat. +sinus pressure, facial pain. ; ; Cardiovascular: Negative for chest pain, palpitations, diaphoresis, dyspnea and peripheral edema. ; ; Respiratory: Negative for cough, wheezing and stridor. ; ; Gastrointestinal: Negative for nausea, vomiting, diarrhea, abdominal pain, blood in stool, hematemesis, jaundice and rectal bleeding. . ; ; Genitourinary: Negative for dysuria, flank pain and hematuria. ; ; Musculoskeletal: Negative for back pain and neck pain. Negative for swelling and trauma.; ; Skin: Negative for pruritus, rash, abrasions, blisters, bruising and skin lesion.; ; Neuro: +near syncope, lightheadedness. Negative for headache and neck stiffness. Negative for altered level of consciousness , altered mental status, extremity weakness, paresthesias, involuntary movement, seizure and syncope.       Allergies  Review of patient's allergies indicates no known allergies.  Home Medications   Current Outpatient Rx  Name  Route  Sig  Dispense  Refill  . ACETAMINOPHEN 325 MG PO TABS   Oral   Take 650 mg by mouth every 4 (four) hours as needed. As needed for pain.         Marland Kitchen CALCIUM CARBONATE-VITAMIN D 600-400 MG-UNIT PO CHEW   Oral   Chew 1 tablet by mouth daily.         Marland Kitchen CARBIDOPA-LEVODOPA 25-100 MG PO TABS   Oral   Take 1-2 tablets by mouth 3 (three) times daily. 2 tabs in am, 1 tab midday, 1 tab in pm         . CLONAZEPAM 0.5 MG PO TABS   Oral   Take 0.5 mg by mouth at  bedtime as needed. For sleep         . DOFETILIDE 250 MCG PO CAPS   Oral   Take 250 mcg by mouth 2 (two) times daily.         Marland Kitchen ENTACAPONE 200 MG PO TABS   Oral   Take 200 mg by mouth 3 (three) times daily. Taken with Sinemet dosing.         . FENTANYL 12 MCG/HR TD PT72   Transdermal   Place 1 patch onto the skin every 3 (three) days.         . FUROSEMIDE 40 MG PO TABS   Oral   Take 20 mg by mouth daily.         Marland Kitchen LIDOCAINE 5 % EX PTCH   Transdermal   Place 1 patch onto the skin daily. Remove & Discard patch within 12 hours or as directed by MD         . METOPROLOL TARTRATE 25 MG PO TABS   Oral   Take 1 tablet (25 mg total) by mouth 2 (two) times daily.   60 tablet   12   . OMEGA-3-ACID ETHYL ESTERS 1 G PO CAPS   Oral   Take 1 g by mouth daily.         Marland Kitchen POTASSIUM CHLORIDE CRYS ER 20 MEQ PO TBCR   Oral   Take 1 tablet (20 mEq total) by mouth daily.   30 tablet   0   . SPIRONOLACTONE 25 MG PO TABS   Oral   Take 25 mg by mouth 2 (two) times daily.          . TRAMADOL HCL 50 MG PO TABS   Oral   Take 50 mg by mouth every 6 (six) hours as needed. Maximum dose= 8 tablets per day. PAIN          . WARFARIN SODIUM 5 MG PO TABS   Oral   Take 5 mg by mouth daily.           BP 101/53  Pulse 81  Temp 97.7 F (36.5 C) (Oral)  Resp 17  SpO2 97%  Physical Exam 1930: Physical examination:  Nursing notes reviewed; Vital signs and O2 SAT reviewed;  Constitutional: Well developed, Well nourished, In no acute distress; Head:  Normocephalic, atraumatic; Eyes: EOMI, PERRL, No scleral icterus; ENMT: TM's clear bilat. +edemetous nasal turbinates bilat with clear rhinorrhea, +tender to percuss frontal and maxillary sinuses.  Mouth and pharynx normal, Mucous membranes dry; Neck: Supple, Full range of motion, No lymphadenopathy; Cardiovascular: Regular rate and rhythm, occasional ectopy. No gallop; Respiratory: Breath sounds clear &  equal bilaterally, No rales,  rhonchi, wheezes.  Speaking full sentences with ease, Normal respiratory effort/excursion; Chest: Nontender, Movement normal; Abdomen: Soft, Nontender, Nondistended, Normal bowel sounds; Genitourinary: No CVA tenderness; Extremities: Pulses normal, No tenderness, No edema, No calf edema or asymmetry.; Neuro: AA&Ox3, Major CN grossly intact.  Strength 5/5 equal bilat UE's and LE's.  DTR 2/4 equal bilat UE's and LE's.  No gross sensory deficits.  Normal cerebellar testing bilat UE's (finger-nose) and LE's (heel-shin).  No pronator drift.  Speech clear.  No facial droop.  No nystagmus.;; Skin: Color normal, Warm, Dry.    ED Course  Procedures    MDM  MDM Reviewed: previous chart, nursing note and vitals Reviewed previous: labs and ECG Interpretation: labs, ECG, x-ray and CT scan    Date: 07/27/2012  Rate: 56  Rhythm: normal sinus rhythm and premature atrial contractions (PAC)  QRS Axis: normal  Intervals: normal  ST/T Wave abnormalities: normal  Conduction Disutrbances:none  Narrative Interpretation:   Old EKG Reviewed: unchanged; no significant changes from previous EKG dated 07/10/2012.  Results for orders placed during the hospital encounter of 07/27/12  BASIC METABOLIC PANEL      Component Value Range   Sodium 136  135 - 145 mEq/L   Potassium 4.3  3.5 - 5.1 mEq/L   Chloride 98  96 - 112 mEq/L   CO2 32  19 - 32 mEq/L   Glucose, Bld 108 (*) 70 - 99 mg/dL   BUN 18  6 - 23 mg/dL   Creatinine, Ser 5.40  0.50 - 1.10 mg/dL   Calcium 9.5  8.4 - 98.1 mg/dL   GFR calc non Af Amer 67 (*) >90 mL/min   GFR calc Af Amer 78 (*) >90 mL/min  CBC WITH DIFFERENTIAL      Component Value Range   WBC 6.0  4.0 - 10.5 K/uL   RBC 4.43  3.87 - 5.11 MIL/uL   Hemoglobin 14.0  12.0 - 15.0 g/dL   HCT 19.1  47.8 - 29.5 %   MCV 97.3  78.0 - 100.0 fL   MCH 31.6  26.0 - 34.0 pg   MCHC 32.5  30.0 - 36.0 g/dL   RDW 62.1  30.8 - 65.7 %   Platelets 203  150 - 400 K/uL   Neutrophils Relative 52  43 - 77 %    Neutro Abs 3.1  1.7 - 7.7 K/uL   Lymphocytes Relative 36  12 - 46 %   Lymphs Abs 2.2  0.7 - 4.0 K/uL   Monocytes Relative 9  3 - 12 %   Monocytes Absolute 0.5  0.1 - 1.0 K/uL   Eosinophils Relative 3  0 - 5 %   Eosinophils Absolute 0.2  0.0 - 0.7 K/uL   Basophils Relative 1  0 - 1 %   Basophils Absolute 0.0  0.0 - 0.1 K/uL  TROPONIN I      Component Value Range   Troponin I <0.30  <0.30 ng/mL  URINALYSIS, ROUTINE W REFLEX MICROSCOPIC      Component Value Range   Color, Urine YELLOW  YELLOW   APPearance CLOUDY (*) CLEAR   Specific Gravity, Urine 1.013  1.005 - 1.030   pH 5.5  5.0 - 8.0   Glucose, UA NEGATIVE  NEGATIVE mg/dL   Hgb urine dipstick NEGATIVE  NEGATIVE   Bilirubin Urine NEGATIVE  NEGATIVE   Ketones, ur NEGATIVE  NEGATIVE mg/dL   Protein, ur NEGATIVE  NEGATIVE mg/dL   Urobilinogen, UA  0.2  0.0 - 1.0 mg/dL   Nitrite NEGATIVE  NEGATIVE   Leukocytes, UA MODERATE (*) NEGATIVE  URINE MICROSCOPIC-ADD ON      Component Value Range   Squamous Epithelial / LPF MANY (*) RARE   WBC, UA 11-20  <3 WBC/hpf   Bacteria, UA MANY (*) RARE   Dg Chest 2 View 07/27/2012  *RADIOLOGY REPORT*  Clinical Data: Weakness and hypertension.  CHEST - 2 VIEW  Comparison: 07/10/2012  Findings: Two views of the chest demonstrate stable elevation of the left hemidiaphragm.  No focal chest disease.  Heart and mediastinum are stable.  Sclerosis and degenerative changes near the thoracolumbar junction.  IMPRESSION: No acute chest findings.   Original Report Authenticated By: Richarda Overlie, M.D.    Ct Head Wo Contrast 07/27/2012  *RADIOLOGY REPORT*  Clinical Data: Weakness and sudden onset headache.  CT HEAD WITHOUT CONTRAST  Technique:  Contiguous axial images were obtained from the base of the skull through the vertex without contrast.  Comparison: 10/17/2011.  Findings: Stable enlarged ventricles and subarachnoid spaces.  No significant change in patchy periventricular white matter low density in both frontal  lobes.  No intracranial hemorrhage, mass lesion or CT evidence of acute infarction.  Small right maxillary sinus retention cyst.  Unremarkable bones.  IMPRESSION:  1.  No acute abnormality. 2.  Stable atrophy and mild chronic small vessel white matter ischemic changes.   Original Report Authenticated By: Beckie Salts, M.D.     2245:  Pt is orthostatic with SBP dropping to 100's from 130.  Afebrile.  +UTI, UC pending.  Will tx with rocephin. Dx and testing d/w pt and family.  Questions answered.  Verb understanding, agreeable to admit.  T/C to Dr. Wylene Simmer (on call for Dr. Timothy Lasso), case discussed, including:  HPI, pertinent PM/SHx, VS/PE, dx testing, ED course and treatment:  Agreeable to admit, requests he will come to the ED for eval.            Laray Anger, DO 07/28/12 1727

## 2012-07-27 NOTE — H&P (Signed)
PCP:   [name], MD   Chief Complaint:  Falls and weakness, feeling like passing out  HPI: Patient is an 79 year old female with Parkinson's and chronic pain as well as Afib followed by Dr. Timothy Lasso with [facility] and Dr. Arbutus Leas with Neuro, who last saw her 3 days ago.  The patient was brought to the ER with feeling like she was going to pass out on standing and ? Of an actual syncopal episode.  Dr/ Tat's note from 3 days ago notes that she often forgets her later day Sinemet dose and was started on Comtan.  She has not had diarrhea but notes feeling weaker over the past couple of days.  ER eval for anemia and CT head were negative although she does have a UTI and is currently receiving IV Rocephin and fluids.  I was asked to admit.  She cannot provide specifics, but feels much weaker than she has in the past  Review of Systems:  Review of Systems - Negative except syncope and general weakness and continued arthritis as noted.  Denies GI, GU, Cardiac or Respiratory symptoms and no new neuro deficits  Negative otherwise on a 12 point review Past Medical History: Past Medical History  Diagnosis Date  . Atrial fibrillation   . Hypertension     hyperlipidemia-  TEE,CARDIOLYTE STRESS 11/12 with LOV  DR TILLEY AND EKG ON CHART  . Peripheral vascular disease     Hx   DVT x 2  2000  . H/O hiatal hernia   . Bladder infection     06/10/11 with treatment by Dr Timothy Lasso  . Arthritis   . History of breast cancer     2000- breast cancer followed by 6 months of chemo, radiation and  PO meds  . History of DVT (deep vein thrombosis) 06/24/2002  . Parkinsonism    Past Surgical History  Procedure Date  . Breast surgery     LUMPECTOMY  WITH AXILLIARY DISSECTION   2000  . Back surgery     2006 - X-Stop spacers placedL3-L4, L4-L5; 2008 - laminectomy and fusion, L3-L5; 2011 Laminectomy and extension of fusion L5-S1  . Tonsillectomy   . Cardiac catheterization     bilateral cataract extraction with IOL    . Cholecystectomy   . Joint replacement     RIGHT HIP REPLACEMENT   1996  . Total knee arthroplasty 06/23/2011    Procedure: TOTAL KNEE ARTHROPLASTY;  Surgeon: Loanne Drilling;  Location: [facility] ORS;  Service: Orthopedics;  Laterality: Right;  . Breast lumpectomy   . Mastectomy   . Breast lumpectomy     Medications: Prior to Admission medications   Medication Sig Start Date End Date Taking? Authorizing Provider  acetaminophen (TYLENOL) 325 MG tablet Take 650 mg by mouth every 4 (four) hours as needed. As needed for pain. 10/18/11 10/17/12 Yes [name], MD  Calcium Carbonate-Vitamin D (CALTRATE 600+D) 600-400 MG-UNIT per chew tablet Chew 1 tablet by mouth daily.   Yes Historical Provider, MD  carbidopa-levodopa (SINEMET IR) 25-100 MG per tablet Take 1-2 tablets by mouth 3 (three) times daily. 2 tabs in am, 1 tab midday, 1 tab in pm   Yes Historical Provider, MD  clonazePAM (KLONOPIN) 0.5 MG tablet Take 0.5 mg by mouth at bedtime as needed. For sleep 10/27/11 10/26/12 Yes Milas Gain, MD  dofetilide (TIKOSYN) 250 MCG capsule Take 250 mcg by mouth 2 (two) times daily.   Yes Historical Provider, MD  entacapone (  COMTAN) 200 MG tablet Take 200 mg by mouth 3 (three) times daily. Taken with Sinemet dosing.   Yes Historical Provider, MD  fentaNYL (DURAGESIC - DOSED MCG/HR) 12 MCG/HR Place 1 patch onto the skin every 3 (three) days.   Yes Historical Provider, MD  furosemide (LASIX) 40 MG tablet Take 20 mg by mouth daily.   Yes Historical Provider, MD  lidocaine (LIDODERM) 5 % Place 1 patch onto the skin daily. Remove & Discard patch within 12 hours or as directed by MD   Yes Historical Provider, MD  metoprolol tartrate (LOPRESSOR) 25 MG tablet Take 1 tablet (25 mg total) by mouth 2 (two) times daily. 12/17/11 12/16/12 Yes Othella Boyer, MD  omega-3 acid ethyl esters (LOVAZA) 1 G capsule Take 1 g by mouth daily. 10/18/11 10/17/12 Yes [name], MD  potassium chloride SA (K-DUR,KLOR-CON) 20 MEQ tablet  Take 1 tablet (20 mEq total) by mouth daily. 12/17/11 12/16/12 Yes Othella Boyer, MD  spironolactone (ALDACTONE) 25 MG tablet Take 25 mg by mouth 2 (two) times daily.  07/16/12  Yes Historical Provider, MD  traMADol (ULTRAM) 50 MG tablet Take 50 mg by mouth every 6 (six) hours as needed. Maximum dose= 8 tablets per day. PAIN    Yes Historical Provider, MD  warfarin (COUMADIN) 5 MG tablet Take 5 mg by mouth daily.   Yes Historical Provider, MD    Allergies:  No Known Allergies  Social History:  reports that she has never smoked. She has never used smokeless tobacco. She reports that she does not drink alcohol or use illicit drugs.  Family History: No family history on file.  Physical Exam: Filed Vitals:   07/27/12 1748 07/27/12 2018 07/27/12 2025 07/27/12 2027  BP: 128/62 128/65 113/58 101/53  Pulse: 74 58 67 81  Temp: 97.6 F (36.4 C) 97.7 F (36.5 C)    TempSrc: Oral Oral    Resp: 18 17    SpO2: 95% 97%     General appearance: alert, cooperative and appears stated age Head: Normocephalic, without obvious abnormality, atraumatic Eyes: conjunctivae/corneas clear. PERRL, EOM's intact.  Nose: Nares normal. Septum midline. Mucosa normal. No drainage or sinus tenderness. Throat: lips, mucosa, and tongue normal; teeth and gums normal Neck: no adenopathy, no carotid bruit, no JVD and thyroid not enlarged, symmetric, no tenderness/mass/nodules Resp: clear to auscultation bilaterally Cardio: irregularly irregular rhythm GI: soft, non-tender; bowel sounds normal; no masses,  no organomegaly Extremities: extremities normal, atraumatic, no cyanosis or edema Pulses: 2+ and symmetric Lymph nodes: Cervical adenopathy: no cervical lymphadenopathy Neurologic: Alert and oriented X 3,slight tremor.   Considerable orthostasis and dizziness with standing noted at bedside   Labs on Admission:   Gi Or Norman 07/27/12 2020  NA 136  K 4.3  CL 98  CO2 32  GLUCOSE 108*  BUN 18  CREATININE 0.80    CALCIUM 9.5  MG --  PHOS --   Basename 07/27/12 2020  WBC 6.0  NEUTROABS 3.1  HGB 14.0  HCT 43.1  MCV 97.3  PLT 203    Basename 07/27/12 2020  CKTOTAL --  CKMB --  CKMBINDEX --  TROPONINI <0.30   Lab Results  Component Value Date   INR 2.58* 07/10/2012   INR 2.11* 05/26/2012   INR 1.81* 12/17/2011    Radiological Exams on Admission: Dg Chest 2 View  07/27/2012  *RADIOLOGY REPORT*  Clinical Data: Weakness and hypertension.  CHEST - 2 VIEW  Comparison: 07/10/2012  Findings: Two views of the chest  demonstrate stable elevation of the left hemidiaphragm.  No focal chest disease.  Heart and mediastinum are stable.  Sclerosis and degenerative changes near the thoracolumbar junction.  IMPRESSION: No acute chest findings.   Original Report Authenticated By: Richarda Overlie, M.D.    Dg Chest 2 View  07/10/2012  *RADIOLOGY REPORT*  Clinical Data: Dizziness.  Hypertension.  Atrial fibrillation.  CHEST - 2 VIEW  Comparison: 05/26/2012.  Findings: Chronic elevation of the left hemidiaphragm with eventration.  This appears similar to the prior exam of November 2013.  Cardiopericardial silhouette is within normal limits. Mediastinal contours are normal.  There is no airspace disease. Chronic pulmonary parenchymal scarring is present within the lingula. Left axillary dissection clips are present.  IMPRESSION: No active cardiopulmonary disease.  Chronic changes of the chest.   Original Report Authenticated By: Andreas Newport, M.D.    Ct Head Wo Contrast  07/27/2012  *RADIOLOGY REPORT*  Clinical Data: Weakness and sudden onset headache.  CT HEAD WITHOUT CONTRAST  Technique:  Contiguous axial images were obtained from the base of the skull through the vertex without contrast.  Comparison: 10/17/2011.  Findings: Stable enlarged ventricles and subarachnoid spaces.  No significant change in patchy periventricular white matter low density in both frontal lobes.  No intracranial hemorrhage, mass lesion or CT  evidence of acute infarction.  Small right maxillary sinus retention cyst.  Unremarkable bones.  IMPRESSION:  1.  No acute abnormality. 2.  Stable atrophy and mild chronic small vessel white matter ischemic changes.   Original Report Authenticated By: Beckie Salts, M.D.    Orders placed during the hospital encounter of 07/27/12  . ED EKG  . ED EKG    Assessment/Plan 1) Syncope/presyncope-  Likely multifactorial, related to autonomic issues with Parkinson's as well as volume depletion, UTI and possibly related to narcotic pain meds.  Will hold Lasix, get orthostatics in AM.  May benefit from Neuro consult as addition of Comtan may be related as well but she has been on it a day with new symptoms.  Will keep on telemetry and reassess clinically in the AM.  May benefit from Florinef or Midodrine, but needs volume repletion first.   Check TSH and AM Cortisol levels to rule out Endocrine cause. 2) Parkinson's- Maintain Sinemet and Comtan at present.  PT eval as well. 3) Afib- Rate controlled on Tikosyn.  Tele, Coumadin per pharmacy. 4) CHF- watch fluid balance with IVF, off Lasix 5) OA/Chronic Pain-  Dr. Timothy Lasso may consider reduction in pain meds if narcotics thought to contribute to her symptoms. 6) UTI- IV Rocephin, cultures pending. 7) Anxiety/sleep-  Continue Clonazepam  Yassine Brunsman W 07/27/2012, 11:06 PM

## 2012-07-27 NOTE — ED Notes (Signed)
Pt complains of weakness and a "sudden onset of headache across my forehead"

## 2012-07-28 ENCOUNTER — Telehealth: Payer: Self-pay | Admitting: Neurology

## 2012-07-28 LAB — TSH: TSH: 2.709 u[IU]/mL (ref 0.350–4.500)

## 2012-07-28 LAB — COMPREHENSIVE METABOLIC PANEL
Alkaline Phosphatase: 53 U/L (ref 39–117)
BUN: 15 mg/dL (ref 6–23)
CO2: 29 mEq/L (ref 19–32)
Calcium: 8.9 mg/dL (ref 8.4–10.5)
GFR calc Af Amer: >90 mL/min (ref >90)
GFR calc non Af Amer: 78 mL/min — ABNORMAL LOW (ref >90)
Glucose, Bld: 94 mg/dL (ref 70–99)
Total Protein: 5.6 g/dL — ABNORMAL LOW (ref 6.0–8.3)

## 2012-07-28 LAB — PROTIME-INR: Prothrombin Time: 27.5 seconds — ABNORMAL HIGH (ref 11.6–15.2)

## 2012-07-28 LAB — CBC
HCT: 38.8 % (ref 36.0–46.0)
Hemoglobin: 12.6 g/dL (ref 12.0–15.0)
MCHC: 32.5 g/dL (ref 30.0–36.0)
RDW: 15 % (ref 11.5–15.5)
WBC: 5.2 10*3/uL (ref 4.0–10.5)

## 2012-07-28 MED ORDER — POTASSIUM CHLORIDE IN NACL 20-0.9 MEQ/L-% IV SOLN
INTRAVENOUS | Status: DC
  Administered 2012-07-28 (×2): via INTRAVENOUS
  Filled 2012-07-28 (×3): qty 1000

## 2012-07-28 MED ORDER — DOFETILIDE 250 MCG PO CAPS
250.0000 ug | ORAL_CAPSULE | Freq: Two times a day (BID) | ORAL | Status: DC
  Administered 2012-07-28 – 2012-07-30 (×6): 250 ug via ORAL
  Filled 2012-07-28 (×7): qty 1

## 2012-07-28 MED ORDER — PNEUMOCOCCAL VAC POLYVALENT 25 MCG/0.5ML IJ INJ
0.5000 mL | INJECTION | INTRAMUSCULAR | Status: AC
  Filled 2012-07-28 (×2): qty 0.5

## 2012-07-28 MED ORDER — WARFARIN SODIUM 5 MG PO TABS: 5.0000 mg | ORAL_TABLET | Freq: Every day | ORAL | Status: DC

## 2012-07-28 MED ORDER — ALUM & MAG HYDROXIDE-SIMETH 200-200-20 MG/5ML PO SUSP: 30.0000 mL | Freq: Four times a day (QID) | ORAL | Status: DC | PRN

## 2012-07-28 MED ORDER — ONDANSETRON HCL 4 MG PO TABS: 4.0000 mg | ORAL_TABLET | Freq: Four times a day (QID) | ORAL | Status: DC | PRN

## 2012-07-28 MED ORDER — WARFARIN - PHYSICIAN DOSING INPATIENT: Freq: Every day | Status: DC

## 2012-07-28 MED ORDER — INFLUENZA VIRUS VACC SPLIT PF IM SUSP
0.5000 mL | INTRAMUSCULAR | Status: AC
  Administered 2012-07-29: 0.5 mL via INTRAMUSCULAR
  Filled 2012-07-28 (×2): qty 0.5

## 2012-07-28 MED ORDER — POTASSIUM CHLORIDE CRYS ER 20 MEQ PO TBCR
20.0000 meq | EXTENDED_RELEASE_TABLET | Freq: Every day | ORAL | Status: DC
  Administered 2012-07-28 – 2012-07-30 (×3): 20 meq via ORAL
  Filled 2012-07-28 (×3): qty 1

## 2012-07-28 MED ORDER — DOCUSATE SODIUM 100 MG PO CAPS
100.0000 mg | ORAL_CAPSULE | Freq: Two times a day (BID) | ORAL | Status: DC
  Administered 2012-07-28 – 2012-07-30 (×5): 100 mg via ORAL
  Filled 2012-07-28 (×6): qty 1

## 2012-07-28 MED ORDER — CARBIDOPA-LEVODOPA 25-100 MG PO TABS: 1.0000 | ORAL_TABLET | Freq: Three times a day (TID) | ORAL | Status: DC

## 2012-07-28 MED ORDER — CEFTRIAXONE SODIUM 1 G IJ SOLR
1.0000 g | INTRAMUSCULAR | Status: DC
  Administered 2012-07-28: 1 g via INTRAVENOUS
  Filled 2012-07-28: qty 10

## 2012-07-28 MED ORDER — ACETAMINOPHEN 325 MG PO TABS: 650.0000 mg | ORAL_TABLET | Freq: Four times a day (QID) | ORAL | Status: DC | PRN

## 2012-07-28 MED ORDER — SODIUM CHLORIDE 0.9 % IJ SOLN
3.0000 mL | Freq: Two times a day (BID) | INTRAMUSCULAR | Status: DC
  Administered 2012-07-29 – 2012-07-30 (×3): 3 mL via INTRAVENOUS

## 2012-07-28 MED ORDER — WARFARIN - PHARMACIST DOSING INPATIENT: Freq: Every day | Status: DC

## 2012-07-28 MED ORDER — HYDROCODONE-ACETAMINOPHEN 5-325 MG PO TABS: 1.0000 | ORAL_TABLET | ORAL | Status: DC | PRN

## 2012-07-28 MED ORDER — METOPROLOL TARTRATE 25 MG PO TABS
25.0000 mg | ORAL_TABLET | Freq: Two times a day (BID) | ORAL | Status: DC
  Administered 2012-07-28 – 2012-07-30 (×6): 25 mg via ORAL
  Filled 2012-07-28 (×7): qty 1

## 2012-07-28 MED ORDER — SPIRONOLACTONE 25 MG PO TABS
25.0000 mg | ORAL_TABLET | Freq: Two times a day (BID) | ORAL | Status: DC
  Administered 2012-07-28 – 2012-07-30 (×6): 25 mg via ORAL
  Filled 2012-07-28 (×7): qty 1

## 2012-07-28 MED ORDER — CARBIDOPA-LEVODOPA 25-100 MG PO TABS
1.0000 | ORAL_TABLET | ORAL | Status: DC
  Administered 2012-07-28: 1 via ORAL
  Filled 2012-07-28 (×3): qty 1

## 2012-07-28 MED ORDER — CARBIDOPA-LEVODOPA 25-100 MG PO TABS
2.0000 | ORAL_TABLET | Freq: Every day | ORAL | Status: DC
  Filled 2012-07-28: qty 2

## 2012-07-28 MED ORDER — ENTACAPONE 200 MG PO TABS
200.0000 mg | ORAL_TABLET | Freq: Three times a day (TID) | ORAL | Status: DC
  Administered 2012-07-28: 200 mg via ORAL
  Filled 2012-07-28 (×4): qty 1

## 2012-07-28 MED ORDER — ACETAMINOPHEN 650 MG RE SUPP: 650.0000 mg | Freq: Four times a day (QID) | RECTAL | Status: DC | PRN

## 2012-07-28 MED ORDER — WARFARIN SODIUM 5 MG PO TABS
5.0000 mg | ORAL_TABLET | Freq: Once | ORAL | Status: AC
  Administered 2012-07-28: 5 mg via ORAL
  Filled 2012-07-28: qty 1

## 2012-07-28 MED ORDER — ENTACAPONE 200 MG PO TABS
200.0000 mg | ORAL_TABLET | Freq: Three times a day (TID) | ORAL | Status: DC
  Filled 2012-07-28: qty 1

## 2012-07-28 MED ORDER — CLONAZEPAM 0.5 MG PO TABS
0.5000 mg | ORAL_TABLET | Freq: Every day | ORAL | Status: DC
  Administered 2012-07-29: 0.5 mg via ORAL
  Filled 2012-07-28 (×2): qty 1

## 2012-07-28 MED ORDER — CARBIDOPA-LEVODOPA 25-100 MG PO TABS
1.0000 | ORAL_TABLET | ORAL | Status: DC
Start: 2012-07-28 — End: 2012-07-28

## 2012-07-28 MED ORDER — ONDANSETRON HCL 4 MG/2ML IJ SOLN: 4.0000 mg | Freq: Four times a day (QID) | INTRAMUSCULAR | Status: DC | PRN

## 2012-07-28 MED ORDER — CARBIDOPA-LEVODOPA 25-100 MG PO TABS
2.0000 | ORAL_TABLET | Freq: Three times a day (TID) | ORAL | Status: DC
  Administered 2012-07-28 – 2012-07-30 (×7): 2 via ORAL
  Filled 2012-07-28 (×9): qty 2

## 2012-07-28 MED ORDER — FENTANYL 12 MCG/HR TD PT72
12.5000 ug | MEDICATED_PATCH | TRANSDERMAL | Status: DC
  Administered 2012-07-29: 12.5 ug via TRANSDERMAL
  Filled 2012-07-28: qty 1

## 2012-07-28 NOTE — Progress Notes (Signed)
Notified Dr. Wylene Simmer of pt having a 6 beat run of V-tach. Pt asymptomatic with no complaints. VSS. Lab work WNL. No orders received at this time.

## 2012-07-28 NOTE — Progress Notes (Signed)
   CARE MANAGEMENT NOTE 07/28/2012  Patient:  Angel Greene, Angel Greene   Account Number:  [id number]  Date Initiated:  07/28/2012  Documentation initiated by:  Jiles Crocker  Subjective/Objective Assessment:   ADMITTED WITH WEAKNESS, UTI     Action/Plan:   PCP: [name], MD  LIVES AT HOME WITH SPOUSE; POSSIBLY NEED HHC AT DISCHARGE   Anticipated DC Date:  08/01/2012   Anticipated DC Plan:  HOME W HOME HEALTH SERVICES      DC Planning Services  CM consult          Status of service:  In process, will continue to follow Medicare Important Message given?  NA - LOS <3 / Initial given by admissions (If response is "NO", the following Medicare IM given date fields will be blank)  Per UR Regulation:  Reviewed for med. necessity/level of care/duration of stay  Comments:  07/28/2012- B Reyansh Kushnir RN,BSN,MHA

## 2012-07-28 NOTE — Evaluation (Signed)
Physical Therapy Evaluation Patient Details Name: Angel Greene MRN: [id number] DOB: [birth year] Today's Date: 07/28/2012 Time: [phone] also) PT Time Calculation (min): 14 min (total 21 minutes.)  PT Assessment / Plan / Recommendation Clinical Impression  Pt is 79 y/o admitted 1/23  with presyncope, hypotension, uti. Pt. has h/o Parkinson's disease/ Pt had orthostatics this AM w/ no significant drop. Pt. reported no dizzines during mobility today. Pt. will benefit from PT while in acute care and recommend HHPT at discharge for safety eval. Pt. did not demonstrate any concerning  risks this am. she does hold onto objects for support if she does not have the RW.    PT Assessment  Patient needs continued PT services    Follow Up Recommendations  Home health PT    Does the patient have the potential to tolerate intense rehabilitation      Barriers to Discharge        Equipment Recommendations  None recommended by PT    Recommendations for Other Services OT consult   Frequency Min 3X/week    Precautions / Restrictions Precautions Precautions: Fall   Pertinent Vitals/Pain No c/o      Mobility  Bed Mobility Bed Mobility: Supine to Sit;Sit to Supine Supine to Sit: 5: Supervision;HOB flat Sit to Supine: 5: Supervision;HOB flat Details for Bed Mobility Assistance: pt required no assistance/ Transfers Transfers: Sit to Stand;Stand to Sit Sit to Stand: 5: Supervision;From toilet;From bed;From chair/3-in-1 Stand to Sit: To toilet;To chair/3-in-1;With upper extremity assist;To bed Details for Transfer Assistance: use of rail beside toilet Ambulation/Gait Ambulation/Gait Assistance: 4: Min assist Ambulation Distance (Feet): 150 Feet (and 20 ft x 2 to BR) Assistive device: 4-wheeled walker;1 person hand held assist Ambulation/Gait Assistance Details: 4 wheeled in hallway, handhold and foot of bed and wall to/from BR. Gait Pattern: Step-through pattern;Decreased stride  length;Trunk flexed Gait velocity: decreased    Shoulder Instructions     Exercises     PT Diagnosis: Difficulty walking;Generalized weakness  PT Problem List: Decreased strength;Decreased activity tolerance;Decreased balance;Decreased mobility;Decreased coordination;Decreased knowledge of precautions;Decreased safety awareness;Decreased knowledge of use of DME PT Treatment Interventions: DME instruction;Gait training;Functional mobility training;Stair training;Therapeutic activities;Patient/family education;Therapeutic exercise;Balance training   PT Goals Acute Rehab PT Goals PT Goal Formulation: With patient Time For Goal Achievement: 08/11/12 Potential to Achieve Goals: Good Pt will go Sit to Stand: with modified independence PT Goal: Sit to Stand - Progress: Goal set today Pt will go Stand to Sit: with modified independence PT Goal: Stand to Sit - Progress: Goal set today Pt will Ambulate: >150 feet;with modified independence;with rolling walker PT Goal: Ambulate - Progress: Goal set today Pt will Go Up / Down Stairs: 1-2 stairs;with supervision;with rail(s) PT Goal: Up/Down Stairs - Progress: Goal set today Pt will Perform Home Exercise Program: Independently PT Goal: Perform Home Exercise Program - Progress: Goal set today  Visit Information  Last PT Received On: 07/28/12 Assistance Needed: +1    Subjective Data  Subjective: I got here late, I hope my husband can find me. Patient Stated Goal: to go home.   Prior Functioning  Home Living Lives With: Spouse Available Help at Discharge: Family Type of Home: House Home Access: Stairs to enter Entrance Stairs-Rails: Right;Left Home Layout: Two level;Able to live on main level with bedroom/bathroom Bathroom Shower/Tub: Health visitor: Handicapped height Home Adaptive Equipment: Built-in shower seat;Grab bars in shower Prior Function Level of Independence: Independent with assistive device(s) Able to  Take Stairs?: Yes Driving: Yes Vocation: Retired Special educational needs teacher  Communication: No difficulties    Cognition  Overall Cognitive Status: Appears within functional limits for tasks assessed/performed Arousal/Alertness: Awake/alert Orientation Level: Appears intact for tasks assessed Behavior During Session: [facility] for tasks performed    Extremity/Trunk Assessment Right Upper Extremity Assessment RUE ROM/Strength/Tone: [facility] for tasks assessed Left Upper Extremity Assessment LUE ROM/Strength/Tone: WFL for tasks assessed Right Lower Extremity Assessment RLE ROM/Strength/Tone: [facility] for tasks assessed Left Lower Extremity Assessment LLE ROM/Strength/Tone: WFL for tasks assessed Trunk Assessment Trunk Assessment: Kyphotic (scoliotic)   Balance Static Sitting Balance Static Sitting - Balance Support: No upper extremity supported Static Sitting - Level of Assistance: 7: Independent Static Standing Balance Static Standing - Balance Support: No upper extremity supported;During functional activity Static Standing - Level of Assistance: 5: Stand by assistance  End of Session PT - End of Session Equipment Utilized During Treatment: Gait belt Activity Tolerance: Patient tolerated treatment well Patient left: in bed;with call bell/phone within reach;with bed alarm set Nurse Communication: Mobility status  GP     Rada Hay 07/28/2012, 10:41 AM  [phone]

## 2012-07-28 NOTE — Progress Notes (Signed)
ANTICOAGULATION CONSULT NOTE - Initial Consult  Pharmacy Consult for Warfarin Indication: AFib, h/o DVT x 2  No Known Allergies  Patient Measurements: Height: 5\' 6"  (167.6 cm) Weight: 167 lb 8.8 oz (76 kg) IBW/kg (Calculated) : 59.3   Vital Signs: Temp: 98.1 F (36.7 C) (01/24 0108) Temp src: Oral (01/24 0108) BP: 131/74 mmHg (01/24 0108) Pulse Rate: 62  (01/24 0108)  Labs:  Alvira Philips 07/27/12 2338 07/27/12 2020  HGB -- 14.0  HCT -- 43.1  PLT -- 203  APTT -- --  LABPROT 26.7* --  INR 2.62* --  HEPARINUNFRC -- --  CREATININE -- 0.80  CKTOTAL -- --  CKMB -- --  TROPONINI -- <0.30    Estimated Creatinine Clearance: 57.5 ml/min (by C-G formula based on Cr of 0.8).   Medical History: Past Medical History  Diagnosis Date  . Atrial fibrillation   . Hypertension     hyperlipidemia-  TEE,CARDIOLYTE STRESS 11/12 with LOV  DR TILLEY AND EKG ON CHART  . Peripheral vascular disease     Hx   DVT x 2  2000  . H/O hiatal hernia   . Bladder infection     06/10/11 with treatment by Dr Timothy Lasso  . Arthritis   . History of breast cancer     2000- breast cancer followed by 6 months of chemo, radiation and  PO meds  . History of DVT (deep vein thrombosis) 06/24/2002  . Parkinsonism     Medications:  Scheduled:    . carbidopa-levodopa  1 tablet Oral Custom  . carbidopa-levodopa  2 tablet Oral Daily  . clonazePAM  0.5 mg Oral QHS  . docusate sodium  100 mg Oral BID  . dofetilide  250 mcg Oral BID  . entacapone  200 mg Oral TID  . fentaNYL  12.5 mcg Transdermal Q72H  . metoprolol tartrate  25 mg Oral BID  . potassium chloride SA  20 mEq Oral Daily  . sodium chloride  3 mL Intravenous Q12H  . spironolactone  25 mg Oral BID  . Warfarin - Pharmacist Dosing Inpatient   Does not apply q1800  . [DISCONTINUED] carbidopa-levodopa  1-2 tablet Oral TID  . [DISCONTINUED] warfarin  5 mg Oral q1800  . [DISCONTINUED] Warfarin - Physician Dosing Inpatient   Does not apply q1800    Infusions:    . 0.9 % NaCl with KCl 20 mEq / L    . [COMPLETED] cefTRIAXone (ROCEPHIN)  IV Stopped (07/27/12 2341)  . cefTRIAXone (ROCEPHIN)  IV    . [DISCONTINUED] sodium chloride 1,000 mL (07/27/12 2044)    Assessment:  79 yr old female with AFib and Parkinson's admitted with Syncope and UTI (IV Rocephin initiated).  PTA warfarin home regimen = 5 mg daily.  Last dose taken 1/23 per patient  INR upon admission = 2.62  Goal of Therapy:  INR 2-3   Plan:   No additional warfarin needed at this time  Check daily PT/INR  Will continue dosing warfarin on 1/24 after daily INR resulted  Safiya Girdler, Joselyn Glassman, PharmD 07/28/2012,1:11 AM

## 2012-07-28 NOTE — Progress Notes (Signed)
ANTICOAGULATION CONSULT NOTE - Follow Up  Pharmacy Consult for Warfarin Indication: AFib, h/o DVT x 2  No Known Allergies  Patient Measurements: Height: 5\' 6"  (167.6 cm) Weight: 167 lb 8.8 oz (76 kg) IBW/kg (Calculated) : 59.3   Vital Signs: Temp: 98.2 F (36.8 C) (01/24 0608) Temp src: Oral (01/24 0608) BP: 104/54 mmHg (01/24 0611) Pulse Rate: 71  (01/24 0611)  Labs:  Basename 07/28/12 0440 07/27/12 2338 07/27/12 2020  HGB 12.6 -- 14.0  HCT 38.8 -- 43.1  PLT 203 -- 203  APTT -- -- --  LABPROT 27.5* 26.7* --  INR 2.72* 2.62* --  HEPARINUNFRC -- -- --  CREATININE 0.72 -- 0.80  CKTOTAL -- -- --  CKMB -- -- --  TROPONINI -- -- <0.30    Estimated Creatinine Clearance: 57.5 ml/min (by C-G formula based on Cr of 0.72).  Medications:  Scheduled:     . carbidopa-levodopa  2 tablet Oral TID  . [COMPLETED] cefTRIAXone (ROCEPHIN)  IV  1 g Intravenous Once  . cefTRIAXone (ROCEPHIN)  IV  1 g Intravenous Q24H  . clonazePAM  0.5 mg Oral QHS  . docusate sodium  100 mg Oral BID  . dofetilide  250 mcg Oral BID  . fentaNYL  12.5 mcg Transdermal Q72H  . influenza  inactive virus vaccine  0.5 mL Intramuscular Tomorrow-1000  . metoprolol tartrate  25 mg Oral BID  . pneumococcal 23 valent vaccine  0.5 mL Intramuscular Tomorrow-1000  . potassium chloride SA  20 mEq Oral Daily  . sodium chloride  3 mL Intravenous Q12H  . spironolactone  25 mg Oral BID  . Warfarin - Pharmacist Dosing Inpatient   Does not apply q1800  . [DISCONTINUED] carbidopa-levodopa  1 tablet Oral Custom  . [DISCONTINUED] carbidopa-levodopa  1 tablet Oral Custom  . [DISCONTINUED] carbidopa-levodopa  1-2 tablet Oral TID  . [DISCONTINUED] carbidopa-levodopa  2 tablet Oral Daily  . [DISCONTINUED] entacapone  200 mg Oral TID  . [DISCONTINUED] entacapone  200 mg Oral TID  . [DISCONTINUED] warfarin  5 mg Oral q1800  . [DISCONTINUED] Warfarin - Physician Dosing Inpatient   Does not apply q1800   Infusions:     .  0.9 % NaCl with KCl 20 mEq / L 40 mL/hr at 07/28/12 0724  . [DISCONTINUED] sodium chloride 1,000 mL (07/27/12 2044)    Assessment:  79 yr old female with AFib and Parkinson's admitted 1/23 with Syncope and UTI (IV Rocephin initiated).  PTA warfarin home regimen = 5 mg daily.  Last dose taken 1/23 per patient  INR upon admission = 2.62. INR this am = 2.72  Goal of Therapy:  INR 2-3   Plan:   Warfarin 5mg  PO x1 at 18:00  Check daily PT/INR  Darrol Angel, PharmD Pager: [phone] 07/28/2012 11:13 AM

## 2012-07-28 NOTE — Progress Notes (Signed)
Subjective: 79 year old female with PDs,  chronic pain, Afib and MMP followed by Dr. Timothy Lasso admitted with presyncope, generalized weakness,  and orthostasis. Dr Tat wrote that she often forgets her later day Sinemet dose and was started on Comtan.  Labs and CCT were (-).  UTI noted and started on  IV Rocephin.  IVF and hold of lasix were pursued for the orthostasis.  Currently no Fever/Chills/CP or SOB. She looks at or better than baseline while lying down. Nurses report she has been up and has been holding onto things.   Objective: Vital signs in last 24 hours: Temp:  [97.6 F (36.4 C)-98.2 F (36.8 C)] 98.2 F (36.8 C) (01/24 1610) Pulse Rate:  [58-81] 71  (01/24 0611) Resp:  [16-18] 18  (01/24 0608) BP: (98-131)/(45-74) 104/54 mmHg (01/24 0611) SpO2:  [94 %-99 %] 94 % (01/24 0608) Weight:  [76 kg (167 lb 8.8 oz)] 76 kg (167 lb 8.8 oz) (01/24 0108) Weight change:  Last BM Date: 07/26/12  CBG (last 3)  No results found for this basename: GLUCAP:3 in the last 72 hours  Intake/Output from previous day:  Intake/Output Summary (Last 24 hours) at 07/28/12 0744 Last data filed at 07/28/12 0433  Gross per 24 hour  Intake    300 ml  Output      0 ml  Net    300 ml   01/23 0701 - 01/24 0700 In: 300 [I.V.:300] Out: -    Physical Exam General appearance: alert, cooperative. Head: Normocephalic, without obvious abnormality, atraumatic  Eyes: conjunctivae/corneas clear. PERRL, EOM's intact.  Nose: Nares normal. Septum midline. Mucosa normal. No drainage or sinus tenderness.  Throat: lips, mucosa, and tongue normal; teeth and gums normal  Neck: no adenopathy, no carotid bruit, no JVD and thyroid not enlarged, symmetric, no tenderness/mass/nodules  Resp: clear to auscultation bilaterally  Cardio: irregularly irregular rhythm  GI: soft, non-tender; bowel sounds normal; no masses, no organomegaly  Extremities: extremities normal, atraumatic, no cyanosis or edema  Pulses: 2+ and  symmetric  LNeurologic: Alert and oriented X 3,slight tremor. Orthostasis noted with change of position.    Lab Results:  [facility] 07/28/12 0440 07/27/12 2020  NA 137 136  K 4.0 4.3  CL 100 98  CO2 29 32  GLUCOSE 94 108*  BUN 15 18  CREATININE 0.72 0.80  CALCIUM 8.9 9.5  MG -- --  PHOS -- --     Basename 07/28/12 0440  AST 16  ALT <5  ALKPHOS 53  BILITOT 0.4  PROT 5.6*  ALBUMIN 3.2*     Basename 07/28/12 0440 07/27/12 2020  WBC 5.2 6.0  NEUTROABS -- 3.1  HGB 12.6 14.0  HCT 38.8 43.1  MCV 96.8 97.3  PLT 203 203    Lab Results  Component Value Date   INR 2.72* 07/28/2012   INR 2.62* 07/27/2012   INR 2.58* 07/10/2012     Basename 07/27/12 2020  CKTOTAL --  CKMB --  CKMBINDEX --  TROPONINI <0.30    No results found for this basename: TSH,T4TOTAL,FREET3,T3FREE,THYROIDAB in the last 72 hours  No results found for this basename: VITAMINB12:2,FOLATE:2,FERRITIN:2,TIBC:2,IRON:2,RETICCTPCT:2 in the last 72 hours  Micro Results: No results found for this or any previous visit (from the past 240 hour(s)).   Studies/Results: Dg Chest 2 View  07/27/2012  *RADIOLOGY REPORT*  Clinical Data: Weakness and hypertension.  CHEST - 2 VIEW  Comparison: 07/10/2012  Findings: Two views of the chest demonstrate stable elevation of the left hemidiaphragm.  No focal chest disease.  Heart and mediastinum are stable.  Sclerosis and degenerative changes near the thoracolumbar junction.  IMPRESSION: No acute chest findings.   Original Report Authenticated By: Richarda Overlie, M.D.    Ct Head Wo Contrast  07/27/2012  *RADIOLOGY REPORT*  Clinical Data: Weakness and sudden onset headache.  CT HEAD WITHOUT CONTRAST  Technique:  Contiguous axial images were obtained from the base of the skull through the vertex without contrast.  Comparison: 10/17/2011.  Findings: Stable enlarged ventricles and subarachnoid spaces.  No significant change in patchy periventricular white matter low density in both  frontal lobes.  No intracranial hemorrhage, mass lesion or CT evidence of acute infarction.  Small right maxillary sinus retention cyst.  Unremarkable bones.  IMPRESSION:  1.  No acute abnormality. 2.  Stable atrophy and mild chronic small vessel white matter ischemic changes.   Original Report Authenticated By: Beckie Salts, M.D.      Medications: Scheduled:    . carbidopa-levodopa  2 tablet Oral TID  . cefTRIAXone (ROCEPHIN)  IV  1 g Intravenous Q24H  . clonazePAM  0.5 mg Oral QHS  . docusate sodium  100 mg Oral BID  . dofetilide  250 mcg Oral BID  . fentaNYL  12.5 mcg Transdermal Q72H  . influenza  inactive virus vaccine  0.5 mL Intramuscular Tomorrow-1000  . metoprolol tartrate  25 mg Oral BID  . pneumococcal 23 valent vaccine  0.5 mL Intramuscular Tomorrow-1000  . potassium chloride SA  20 mEq Oral Daily  . sodium chloride  3 mL Intravenous Q12H  . spironolactone  25 mg Oral BID  . Warfarin - Pharmacist Dosing Inpatient   Does not apply q1800   Continuous:    . 0.9 % NaCl with KCl 20 mEq / L 40 mL/hr at 07/28/12 4098     Assessment/Plan: Active Problems:  * No active hospital problems. *   1) Syncope/presyncope- most likely related to the addition of her new drug Comtan as her Sxs correlate with the stat=rt of this med but may be multifactorial, related to autonomic issues with Parkinson's as well as volume depletion, UTI and possibly related to narcotic pain meds. Lasix on hold, orthostatics this AM were (-). As far as Comtan may be related -- I went on Epocrates and Serious Adverse Reactions are Dyskinesia, Ortho hypotension, Syncope, hallucinations, rhabdo.  There are others.  Since her condition clinically correlates with the start of the med - I will hold it.  Will keep on telemetry. Since she did not tilt so I will not start Florinef or Midodrine.  Continue volume repletion but decrease Vol. Check TSH and AM Cortisol levels.  2) Parkinson's- Maintain Sinemet at 2 tabs 3  times per day and Hold Comtan at present. PT eval as well.  3) Afib- Rate controlled on Tikosyn. Tele, Coumadin per pharmacy. Asymptomatic 6 beat run of V-tach last night - monitor and consider Dr Donnie Aho consult. 4) CHF/CM c EF 40-50%- watch fluid balance with IVF, off Lasix  5) OA/Chronic Pain- Dr. Timothy Lasso may consider reduction in pain meds if narcotics thought to contribute to her symptoms.  6) UTI- IV Rocephin, cultures pending.  7) Anxiety/sleep- Continue Clonazepam  9) H/O PE 10/2011 - Coumadin Lifelong. 10) Sig LBP and OA issues - Further surgeries on hold due to severe reaction she had with the anesthesia last yr for her TKR 11) HTN  Plan: Over the next 1-2 days change IV Abx to PO, D/C IVF, get PT eval,  Ambulate, talk with Dr Tat, adjust meds - anticipate D/C by Sunday if all is well.  Labs are fine.    ID -  Anti-infectives     Start     Dose/Rate Route Frequency Ordered Stop   07/28/12 2300   cefTRIAXone (ROCEPHIN) 1 g in dextrose 5 % 50 mL IVPB        1 g 100 mL/hr over 30 Minutes Intravenous Every 24 hours 07/28/12 0047     01 /23/14 2245   cefTRIAXone (ROCEPHIN) 1 g in dextrose 5 % 50 mL IVPB        1 g 100 mL/hr over 30 Minutes Intravenous  Once 07/27/12 2243 07/27/12 2341         DVT Prophylaxis coumadin - INR at goal.    LOS: 1 day   Daley Gosse M 07/28/2012, 7:44 AM

## 2012-07-28 NOTE — Telephone Encounter (Signed)
Returned call from Dr. Timothy Lasso at [facility].  Read pts notes.  Pt with syncope/near syncope.  Agree with hold comtan.  Try to get pt to be more compliant with levodopa alone dosing schedule.  Will check orthostatics as outpt.

## 2012-07-29 LAB — URINE CULTURE: Culture: NO GROWTH

## 2012-07-29 LAB — CORTISOL-AM, BLOOD: Cortisol - AM: 12.8 ug/dL (ref 4.3–22.4)

## 2012-07-29 LAB — PROTIME-INR: Prothrombin Time: 24 seconds — ABNORMAL HIGH (ref 11.6–15.2)

## 2012-07-29 MED ORDER — AMOXICILLIN-POT CLAVULANATE 500-125 MG PO TABS
1.0000 | ORAL_TABLET | Freq: Two times a day (BID) | ORAL | Status: DC
  Administered 2012-07-29 – 2012-07-30 (×3): 500 mg via ORAL
  Filled 2012-07-29 (×6): qty 1

## 2012-07-29 MED ORDER — WARFARIN SODIUM 5 MG PO TABS
5.0000 mg | ORAL_TABLET | Freq: Once | ORAL | Status: AC
  Administered 2012-07-29: 5 mg via ORAL
  Filled 2012-07-29: qty 1

## 2012-07-29 MED ORDER — SACCHAROMYCES BOULARDII 250 MG PO CAPS
250.0000 mg | ORAL_CAPSULE | Freq: Two times a day (BID) | ORAL | Status: DC
  Administered 2012-07-29 – 2012-07-30 (×3): 250 mg via ORAL
  Filled 2012-07-29 (×4): qty 1

## 2012-07-29 NOTE — Progress Notes (Signed)
ANTICOAGULATION CONSULT NOTE   Pharmacy Consult for Warfarin Indication: AFib, h/o DVT x 2  No Known Allergies  Patient Measurements: Height: 5\' 6"  (167.6 cm) Weight: 167 lb 8.8 oz (76 kg) IBW/kg (Calculated) : 59.3   Vital Signs: Temp: 98 F (36.7 C) (01/25 0633) Temp src: Oral (01/25 0633) BP: 140/75 mmHg (01/25 0659) Pulse Rate: 60  (01/25 0659)  Labs:  Basename 07/29/12 0503 07/28/12 0440 07/27/12 2338 07/27/12 2020  HGB -- 12.6 -- 14.0  HCT -- 38.8 -- 43.1  PLT -- 203 -- 203  APTT -- -- -- --  LABPROT 24.0* 27.5* 26.7* --  INR 2.26* 2.72* 2.62* --  HEPARINUNFRC -- -- -- --  CREATININE -- 0.72 -- 0.80  CKTOTAL -- -- -- --  CKMB -- -- -- --  TROPONINI -- -- -- <0.30    Estimated Creatinine Clearance: 57.5 ml/min (by C-G formula based on Cr of 0.72).  Medications:  Scheduled:     . amoxicillin-clavulanate  1 tablet Oral BID  . carbidopa-levodopa  2 tablet Oral TID  . clonazePAM  0.5 mg Oral QHS  . docusate sodium  100 mg Oral BID  . dofetilide  250 mcg Oral BID  . fentaNYL  12.5 mcg Transdermal Q72H  . influenza  inactive virus vaccine  0.5 mL Intramuscular Tomorrow-1000  . metoprolol tartrate  25 mg Oral BID  . pneumococcal 23 valent vaccine  0.5 mL Intramuscular Tomorrow-1000  . potassium chloride SA  20 mEq Oral Daily  . saccharomyces boulardii  250 mg Oral BID  . sodium chloride  3 mL Intravenous Q12H  . spironolactone  25 mg Oral BID  . [COMPLETED] warfarin  5 mg Oral ONCE-1800  . Warfarin - Pharmacist Dosing Inpatient   Does not apply q1800  . [DISCONTINUED] cefTRIAXone (ROCEPHIN)  IV  1 g Intravenous Q24H   Infusions:     . [DISCONTINUED] 0.9 % NaCl with KCl 20 mEq / L 40 mL/hr at 07/28/12 1957    Assessment:  79 yr old female with AFib, hx DVT, and Parkinson's disease admitted 1/23 with Syncope and UTI (IV Rocephin initiated).  PTA warfarin home regimen = 5 mg daily.  Last dose taken 1/23 per patient.  INR was therapeutic upon  admission.  Warfarin 5 mg PO x 1 administered yesterday as per patient's usual regimen.  No bleeding reported.  Goal of Therapy:  INR 2-3   Plan:   Warfarin 5mg  PO x1 at 18:00  Check daily PT/INR while inpatient.  Elie Goody, PharmD, BCPS Pager: [phone] 07/29/2012  11:16 AM

## 2012-07-29 NOTE — Progress Notes (Signed)
Subjective: Still a bit weak. Would like to wait until tomorrow for d/c.  She did walk with a walker in the hall yesterday with assistance. She felt okay with that. No pain now.    Objective: Vital signs in last 24 hours: Temp:  [98 F (36.7 C)-98.5 F (36.9 C)] 98 F (36.7 C) (01/25 0454) Pulse Rate:  [56-75] 60  (01/25 0659) Resp:  [16-18] 18  (01/25 0633) BP: (108-140)/(53-75) 140/75 mmHg (01/25 0659) SpO2:  [94 %-96 %] 95 % (01/25 0981) Weight change:  Last BM Date: 07/26/12  Intake/Output from previous day: 01/24 0701 - 01/25 0700 In: 949 [P.O.:240; I.V.:709] Out: 700 [Urine:700] Intake/Output this shift:    General appearance: alert, cooperative and appears stated age Resp: clear to auscultation bilaterally Cardio: regular rate and rhythm, S1, S2 normal, no murmur, click, rub or gallop GI: soft, non-tender; bowel sounds normal; no masses,  no organomegaly Extremities: extremities normal, atraumatic, no cyanosis or edema Neurologic: Grossly normal   Lab Results:  Basename 07/28/12 0440 07/27/12 2020  WBC 5.2 6.0  HGB 12.6 14.0  HCT 38.8 43.1  PLT 203 203   BMET  Basename 07/28/12 0440 07/27/12 2020  NA 137 136  K 4.0 4.3  CL 100 98  CO2 29 32  GLUCOSE 94 108*  BUN 15 18  CREATININE 0.72 0.80  CALCIUM 8.9 9.5   CMET CMP     Component Value Date/Time   NA 137 07/28/2012 0440   K 4.0 07/28/2012 0440   CL 100 07/28/2012 0440   CO2 29 07/28/2012 0440   GLUCOSE 94 07/28/2012 0440   BUN 15 07/28/2012 0440   CREATININE 0.72 07/28/2012 0440   CALCIUM 8.9 07/28/2012 0440   PROT 5.6* 07/28/2012 0440   ALBUMIN 3.2* 07/28/2012 0440   AST 16 07/28/2012 0440   ALT <5 07/28/2012 0440   ALKPHOS 53 07/28/2012 0440   BILITOT 0.4 07/28/2012 0440   GFRNONAA 78* 07/28/2012 0440   GFRAA >90 07/28/2012 0440     Studies/Results: Dg Chest 2 View  07/27/2012  *RADIOLOGY REPORT*  Clinical Data: Weakness and hypertension.  CHEST - 2 VIEW  Comparison: 07/10/2012  Findings: Two  views of the chest demonstrate stable elevation of the left hemidiaphragm.  No focal chest disease.  Heart and mediastinum are stable.  Sclerosis and degenerative changes near the thoracolumbar junction.  IMPRESSION: No acute chest findings.   Original Report Authenticated By: Richarda Overlie, M.D.    Ct Head Wo Contrast  07/27/2012  *RADIOLOGY REPORT*  Clinical Data: Weakness and sudden onset headache.  CT HEAD WITHOUT CONTRAST  Technique:  Contiguous axial images were obtained from the base of the skull through the vertex without contrast.  Comparison: 10/17/2011.  Findings: Stable enlarged ventricles and subarachnoid spaces.  No significant change in patchy periventricular white matter low density in both frontal lobes.  No intracranial hemorrhage, mass lesion or CT evidence of acute infarction.  Small right maxillary sinus retention cyst.  Unremarkable bones.  IMPRESSION:  1.  No acute abnormality. 2.  Stable atrophy and mild chronic small vessel white matter ischemic changes.   Original Report Authenticated By: Beckie Salts, M.D.     Medications: I have reviewed the patient's current medications.    . carbidopa-levodopa  2 tablet Oral TID  . cefTRIAXone (ROCEPHIN)  IV  1 g Intravenous Q24H  . clonazePAM  0.5 mg Oral QHS  . docusate sodium  100 mg Oral BID  . dofetilide  250 mcg Oral BID  .  fentaNYL  12.5 mcg Transdermal Q72H  . influenza  inactive virus vaccine  0.5 mL Intramuscular Tomorrow-1000  . metoprolol tartrate  25 mg Oral BID  . pneumococcal 23 valent vaccine  0.5 mL Intramuscular Tomorrow-1000  . potassium chloride SA  20 mEq Oral Daily  . sodium chloride  3 mL Intravenous Q12H  . spironolactone  25 mg Oral BID  . Warfarin - Pharmacist Dosing Inpatient   Does not apply q1800     Assessment/Plan: Syncope or presyncope.  She may have had a reaction to the new parkinson's medicine. She is off that now.  She may have had  A uti.  But,  ucx neg.  Switch to oral antibiotic for a few more  days as the u/a was suspicious.  Continue coumadin.  She has a 50 mcg fentanyl patch on her back. We will remove that and make sure she is only on the 12.5 mcg dose.  D/C iv fluid and monitor bp. Hopefully she will be ready for d/c home tomorrow.  Active Problems:  * No active hospital problems. *    LOS: 2 days   Ezequiel Kayser, MD 07/29/2012, 7:36 AM

## 2012-07-30 NOTE — Discharge Summary (Signed)
Physician Discharge Summary  Patient ID: Angel Greene MRN: [id number] DOB/AGE: 1929-12-04 79 y.o.  Admit date: 07/27/2012 Discharge date: 07/30/2012  Admission Diagnoses: presyncope  Discharge Diagnoses:  Parkinson's Disease Presyncope felt to possibly be related to a new parkinson's medicine Possible UTI Gait instability Atrial fibrillation CHF HTN OA History of pulmonary embolism 2013 Anxiety Nonsustained vtach (only 6 beats on tele this admit)   Discharged Condition: stable   Hospital Course: Angel Greene was admitted with an episode of presyncope. She had recently been started on comtan for her parkinson's disease. This was stopped. She was hydrated with NS and she was treated with a few doses of Rocephin for a suspected uti .  She was a bit weak but gradually felt better and she was able  to ambulate with a walker in the hall. She had no recurrence of her symptoms.  By 07/30/12 she was felt stable for discharge home.  Consults: None  Significant Diagnostic Studies: neg. cct  Treatments: Iv hydration,  Antibiotic for possible uti  Discharge Exam:  Blood pressure 124/55, pulse 62, temperature 98.3 F (36.8 C), temperature source Oral, resp. rate 16, height 5\' 6"  (1.676 m), weight 76 kg (167 lb 8.8 oz), SpO2 92.00%.  Physical Exam: Angel Greene is alert oriented. Semi-supine in bed. No pallor or icterus.  Moe times 4 with grossly nL strength. Lungs are cta bilat. No w/r/r. Heart is actually rrr with no m/r/g.  Abd soft, nt, no mass or hsm and she has no sig. Edema.  Disposition:discharge to home.  CBC    Component Value Date/Time   WBC 5.2 07/28/2012 0440   WBC 6.0 01/04/2008 0931   RBC 4.01 07/28/2012 0440   HGB 12.6 07/28/2012 0440   HGB 14.0 01/04/2008 0931   HCT 38.8 07/28/2012 0440   HCT 41.3 01/04/2008 0931   PLT 203 07/28/2012 0440   PLT 253 01/04/2008 0931   MCV 96.8 07/28/2012 0440   MCV 91 01/04/2008 0931   MCH 31.4 07/28/2012 0440   MCH 30.6 01/04/2008 0931   MCHC 32.5 07/28/2012  0440   MCHC 33.8 01/04/2008 0931   RDW 15.0 07/28/2012 0440   RDW 12.0 01/04/2008 0931   LYMPHSABS 2.2 07/27/2012 2020   LYMPHSABS 1.8 01/04/2008 0931   MONOABS 0.5 07/27/2012 2020   EOSABS 0.2 07/27/2012 2020   EOSABS 0.4 01/04/2008 0931   BASOSABS 0.0 07/27/2012 2020   BASOSABS 0.0 01/04/2008 0931    BMET    Component Value Date/Time   NA 137 07/28/2012 0440   K 4.0 07/28/2012 0440   CL 100 07/28/2012 0440   CO2 29 07/28/2012 0440   GLUCOSE 94 07/28/2012 0440   BUN 15 07/28/2012 0440   CREATININE 0.72 07/28/2012 0440   CALCIUM 8.9 07/28/2012 0440   GFRNONAA 78* 07/28/2012 0440   GFRAA >90 07/28/2012 0440        Medication List     As of 07/30/2012  9:05 AM    ASK your doctor about these medications         acetaminophen 325 MG tablet   Commonly known as: TYLENOL   Take 650 mg by mouth every 4 (four) hours as needed. As needed for pain.      CALTRATE 600+D 600-400 MG-UNIT per chew tablet   Generic drug: Calcium Carbonate-Vitamin D   Chew 1 tablet by mouth daily.      carbidopa-levodopa 25-100 MG per tablet   Commonly known as: SINEMET IR   Take 1-2 tablets by mouth  3 (three) times daily. 2 tabs in am, 1 tab midday, 1 tab in pm      clonazePAM 0.5 MG tablet   Commonly known as: KLONOPIN   Take 0.5 mg by mouth at bedtime as needed. For sleep      dofetilide 250 MCG capsule   Commonly known as: TIKOSYN   Take 250 mcg by mouth 2 (two) times daily.      Stop the :   entacapone 200 MG tablet (comtan)            fentaNYL 12.5 MCG/HR   Commonly known as: DURAGESIC - dosed mcg/hr   Place 1 patch onto the skin every 3 (three) days.      furosemide 40 MG tablet   Commonly known as: LASIX   Take 20 mg by mouth daily.      lidocaine 5 %   Commonly known as: LIDODERM   Place 1 patch onto the skin daily. Remove & Discard patch within 12 hours or as directed by MD      metoprolol tartrate 25 MG tablet   Commonly known as: LOPRESSOR   Take 1 tablet (25 mg total) by mouth 2 (two)  times daily.      omega-3 acid ethyl esters 1 G capsule   Commonly known as: LOVAZA   Take 1 g by mouth daily.      potassium chloride SA 20 MEQ tablet   Commonly known as: K-DUR,KLOR-CON   Take 1 tablet (20 mEq total) by mouth daily.      spironolactone 25 MG tablet   Commonly known as: ALDACTONE   Take 25 mg by mouth 2 (two) pills each a.m.      traMADol 50 MG tablet   Commonly known as: ULTRAM   Take 50 mg by mouth every 6 (six) hours as needed. Maximum dose= 8 tablets per day. PAIN        warfarin 5 MG tablet   Commonly known as: COUMADIN   Take 5 mg by mouth daily. At 7 pm      call dr. Timothy Lasso office [phone] for a visit in about a week  (or keep physical in next two weeks if it is that soon). Call if any problems.  SignedRodrigo Ran A 07/30/2012, 9:05 AM

## 2012-07-30 NOTE — Progress Notes (Signed)
Pt had 6 bts of VTach, asleep, asymptomatic, and VSS. NP made aware. No new orders.   07/29/12 2351  Vitals  BP ! 119/50 mmHg  BP Location Right arm  BP Method Automatic  Patient Position, if appropriate Lying  Pulse Rate 63   Pulse Rate Source Dinamap  Cardiac Rhythm NSR  Ectopy Ventricular tachycardia, non-sustained (6 bts)  New onset of dysrhythmia? Yes  Dysrhythmia  Level of Consciousness Arousable (pt sleeping)  Symptoms of Dysrhythmia None  Name of MD Notified Daphane Shepherd   Date MD notified 07/30/12  Time MD notified 0001  Oxygen Therapy  SpO2 92 %  O2 Device None (Room air)

## 2012-10-30 ENCOUNTER — Ambulatory Visit (INDEPENDENT_AMBULATORY_CARE_PROVIDER_SITE_OTHER): Payer: Medicare Other | Admitting: Neurology

## 2012-10-30 ENCOUNTER — Encounter: Payer: Self-pay | Admitting: Neurology

## 2012-10-30 VITALS — BP 116/78 | HR 64 | Temp 97.6°F | Resp 12 | Ht 65.0 in | Wt 161.0 lb

## 2012-10-30 DIAGNOSIS — G20 Parkinson's disease: Secondary | ICD-10-CM

## 2012-10-30 DIAGNOSIS — R55 Syncope and collapse: Secondary | ICD-10-CM | POA: Insufficient documentation

## 2012-10-30 MED ORDER — CLONAZEPAM 0.5 MG PO TABS: ORAL_TABLET | ORAL | Status: DC

## 2012-10-30 NOTE — Patient Instructions (Addendum)
Prescription for Klonopin sent to [facility]  PT will be calling to set up your appointments.  We will see you back in three months.  Call us in two weeks for an update.

## 2012-10-30 NOTE — Progress Notes (Signed)
Angel Greene was seen today in the movement disorders clinic for f/u re: PD.  [name] notes were reviewed.  The patient reported that she went to her neurosurgeon for f/u for her back and sx's were noted and she was referred to [name] in 2008-09.  [name] dx her with PD.  She has been on requip xl - 12 mg in the past.  She is now supposed to be on carbidopa/levodopa 25/100, 2 tablets in the AM, 1 in the afternoon and 1 before dinner.  Her biggest c/o is vivid dreams, so vivid that she describes them as hallucinations.  They only arise, however, out of sleep. She is also supposed to be on klonopin for RBD.  She cannot recall if she is actually taking it.  I called the pharmacy and it appears that she only picked it up one time in October.  I reviewed her notes, and it did not appear that there was a reason for this.  Since last visit, the patient was hospitalized for near syncope.  She has trouble recounting the exact episode since it was several months ago.  She does tell me about a time when she didn't feel good in church, but her husband reports that this was about the same incident.  Nonetheless, the near syncope was not long after I added Comtan, so it was thought that perhaps this was the etiology.  She has had a history of near syncope in the past with multiple episodes of dizziness in the past even before the addition of Comtan.  While in the hospital, on 07/27/2012, the patient did have a 6 beat run of asymptomatic V. tach.  Wearing off:  no  How long before next dose:  n/a Falls:   no N/V:  yes Hallucinations:  yes  visual distortions: no Lightheaded:  yes but it has been improved lately.  Syncope: no Dyskinesia:  no    Specific Symptoms:  Tremor: L arm more than Right but both shake intermittently. Voice: no change Sleep:   Vivid Dreams:  yes  Acting out dreams:  Yes, screams in sleep.   Wet Pillows: some, but none during day Postural symptoms:  Occasional stumble but feels  pretty balanced  Falls?  no Loss of smell:  no Loss of taste:  no Urinary Incontinence:  In the last 2 weeks, some "leakage" Difficulty Swallowing:  no Handwriting, micrographia: yes Trouble with ADL's:  no  Trouble buttoning clothing:  no Depression:  no Memory changes:  Yes per patient's husband  No longer drives..  Quit driving after knee replacement 1 year ago.     PREVIOUS MEDICATIONS: Requip XL - 12 mg, Lyrica, Pamelor, Seroquel (when with Dr. Thana Farr)  ALLERGIES:  No Known Allergies  CURRENT MEDICATIONS:  Current Outpatient Prescriptions on File Prior to Visit  Medication Sig Dispense Refill  . Calcium Carbonate-Vitamin D (CALTRATE 600+D) 600-400 MG-UNIT per chew tablet Chew 1 tablet by mouth daily.      . carbidopa-levodopa (SINEMET IR) 25-100 MG per tablet Take 1-2 tablets by mouth 3 (three) times daily. 2 tabs in am, 1 tab midday, 1 tab in pm      . dofetilide (TIKOSYN) 250 MCG capsule Take 250 mcg by mouth 2 (two) times daily.      . fentaNYL (DURAGESIC - DOSED MCG/HR) 12 MCG/HR Place 1 patch onto the skin every 3 (three) days.      . furosemide (LASIX) 40 MG tablet Take 20 mg by  mouth as needed.       . lidocaine (LIDODERM) 5 % Place 1 patch onto the skin daily. Remove & Discard patch within 12 hours or as directed by MD      . metoprolol tartrate (LOPRESSOR) 25 MG tablet Take 1 tablet (25 mg total) by mouth 2 (two) times daily.  60 tablet  12  . potassium chloride SA (K-DUR,KLOR-CON) 20 MEQ tablet Take 1 tablet (20 mEq total) by mouth daily.  30 tablet  0  . spironolactone (ALDACTONE) 25 MG tablet Take 25 mg by mouth 2 (two) times daily.       . traMADol (ULTRAM) 50 MG tablet Take 50 mg by mouth every 6 (six) hours as needed. Maximum dose= 8 tablets per day. PAIN       . warfarin (COUMADIN) 5 MG tablet Take 5 mg by mouth daily.      Marland Kitchen omega-3 acid ethyl esters (LOVAZA) 1 G capsule Take 1 g by mouth daily.       No current facility-administered medications on file  prior to visit.    PAST MEDICAL HISTORY:   Past Medical History  Diagnosis Date  . Atrial fibrillation   . Hypertension     hyperlipidemia-  TEE,CARDIOLYTE STRESS 11/12 with LOV  DR TILLEY AND EKG ON CHART  . Peripheral vascular disease     Hx   DVT x 2  2000  . H/O hiatal hernia   . Bladder infection     06/10/11 with treatment by Dr Timothy Lasso  . Arthritis   . History of breast cancer     2000- breast cancer followed by 6 months of chemo, radiation and  PO meds  . History of DVT (deep vein thrombosis) 06/24/2002  . Parkinsonism     PAST SURGICAL HISTORY:   Past Surgical History  Procedure Laterality Date  . Breast surgery      LUMPECTOMY  WITH AXILLIARY DISSECTION   2000  . Back surgery      2006 - X-Stop spacers placedL3-L4, L4-L5; 2008 - laminectomy and fusion, L3-L5; 2011 Laminectomy and extension of fusion L5-S1  . Tonsillectomy    . Cardiac catheterization      bilateral cataract extraction with IOL  . Cholecystectomy    . Joint replacement      RIGHT HIP REPLACEMENT   1996  . Total knee arthroplasty  06/23/2011    Procedure: TOTAL KNEE ARTHROPLASTY;  Surgeon: Loanne Drilling;  Location: [facility];  Service: Orthopedics;  Laterality: Right;  . Breast lumpectomy    . Mastectomy    . Breast lumpectomy      SOCIAL HISTORY:   History   Social History  . Marital Status: Married    Spouse Name: N/A    Number of Children: N/A  . Years of Education: N/A   Occupational History  . Retired     Runner, broadcasting/film/video, Mudlogger   Social History Main Topics  . Smoking status: Never Smoker   . Smokeless tobacco: Never Used  . Alcohol Use: No  . Drug Use: No  . Sexually Active: Not on file   Other Topics Concern  . Not on file   Social History Narrative  . No narrative on file    FAMILY HISTORY:   Family Status  Relation Status Death Age  . Mother Deceased     CA, breast  . Father Deceased     CVA  . Brother Deceased     2, MI  ROS:  A complete 10 system review of  systems was obtained and was unremarkable apart from what is mentioned above.  PHYSICAL EXAMINATION:    VITALS:   Filed Vitals:   10/30/12 1500  BP: 116/78  Pulse: 64  Temp: 97.6 F (36.4 C)  Resp: 12  Height: 5\' 5"  (1.651 m)  Weight: 161 lb (73.029 kg)    GEN:  The patient appears stated age and is in NAD. HEENT:  Normocephalic, atraumatic.  The mucous membranes are moist. The superficial temporal arteries are without ropiness or tenderness. CV:  RRR Lungs:  CTAB Neck/HEME:  There are no carotid bruits bilaterally.  Neurological examination:  Orientation: The patient appears much more confused than previous, and her husband supplies more of the history. Cranial nerves: There is good facial symmetry. Pupils are equal round and reactive to light bilaterally. Fundoscopic exam reveals clear margins bilaterally. Extraocular muscles are intact. The visual fields are full to confrontational testing. The speech is fluent and clear. Soft palate rises symmetrically and there is no tongue deviation. Hearing is intact to conversational tone. Sensation: Sensation is intact to light and pinprick throughout (facial, trunk, extremities). Vibration is decrease at the bilateral big toe. There is no extinction with double simultaneous stimulation. There is no sensory dermatomal level identified. Motor: Strength is 5/5 in the bilateral upper and lower extremities.   Shoulder shrug is equal and symmetric.  There is no pronator drift. Deep tendon reflexes: Deep tendon reflexes are 1/4 at the bilateral biceps, triceps, brachioradialis, and absent at the patella and achilles. Plantar responses are downgoing bilaterally.  Movement examination: Tone: There is mild increased tone in the right upper extremity with activation.  Tone in the left upper tremor extremity is normal.  The tone in the lower extremities is normal.  Abnormal movements:  There is an intermittent right upper extremity resting tremor.   There is a tremor of the left foot.  Coordination:  There is mild decremation with RAM's Gait and Station: The patient has  significant  difficulty arising out of a deep-seated chair without the use of the hands. The patient's stride length is  decreased with a shuffling gait.  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  She has  stooped posture.  She has an antalgic gait because of left knee pain.  The patient has a  negative pull test.      ASSESSMENT/PLAN: 1.  Idiopathic Parkinson's disease.  This is overall mild.    This is evidenced by tremor, bradykinesia, mild rigidity and mild postural instability.    -She will remain on the carbidopa/levodopa 25/100,  2 tablets in the morning, one in the afternoon and 1 before dinner.    -I am going to hold the entacapone for now, since she had some near syncope after the addition.  However, given the fact that when she was hospitalized she had a 6 beat run of V. tach and given history of near-syncope and dizziness prior to the addition of entacapone, I am not completely convinced that it was the medication.  Nonetheless, we will hold it.   my ability.  The patient expressed understanding and willingness to follow the outlined treatment protocols.  -I. am very convinced that the patient needs PT and OT.  She has refused this the last 2 visits, but I asked her to at least go and let them evaluate her.  She was agreeable today. 2  REM behavior disorder.  This is commonly associated with  Parkinson's disease.    -I am going to again have her try the clonazepam, 0.5 mg, half a tablet at night.  I do not know what kind of response she had in the past and she cannot remember.  She has not picked it up at the pharmacy since October.  It does not appear that she had any bad side effects.  We will call her in 2 weeks and see how she is doing and see if we need to increase the dose.   -I. asked her to check and see if she is actually on the clonazepam and to call  me back and let me know. 3.  memory loss.  This is likely related to the Parkinson's disease.  Her husband has noticed a slow decline over the last year.  -I. would like to add Exelon, but we are going to wait since we have the clonazepam today.  I told her we would discuss this again next visit. 4.  Return in about 3 months (around 01/29/2013). 5.  Time in room:  25 min  -

## 2012-11-28 ENCOUNTER — Other Ambulatory Visit: Payer: Self-pay | Admitting: Neurology

## 2012-11-28 ENCOUNTER — Telehealth: Payer: Self-pay | Admitting: Neurology

## 2012-11-28 MED ORDER — CARBIDOPA-LEVODOPA 25-100 MG PO TABS: 1.0000 | ORAL_TABLET | Freq: Three times a day (TID) | ORAL | Status: DC

## 2012-11-28 NOTE — Telephone Encounter (Signed)
Done--e-scribed to [employer].

## 2012-11-28 NOTE — Telephone Encounter (Signed)
Received a fax from pharmacy for refill on Carbo-Levo 25/100 mg = 2 po BID. Please advise. Last ov states she takes it 2 q am, 1 in the afternoon and one before dinner.

## 2012-11-28 NOTE — Telephone Encounter (Signed)
Go ahead and refill and put 5 refills on it.

## 2013-01-24 ENCOUNTER — Ambulatory Visit: Payer: Medicare Other | Admitting: Neurology

## 2013-01-24 ENCOUNTER — Encounter: Payer: Self-pay | Admitting: Neurology

## 2013-01-24 ENCOUNTER — Ambulatory Visit (INDEPENDENT_AMBULATORY_CARE_PROVIDER_SITE_OTHER): Payer: Medicare Other | Admitting: Neurology

## 2013-01-24 VITALS — BP 122/76 | HR 68 | Temp 97.9°F | Resp 16 | Wt 156.0 lb

## 2013-01-24 DIAGNOSIS — G4752 REM sleep behavior disorder: Secondary | ICD-10-CM

## 2013-01-24 DIAGNOSIS — G20A1 Parkinson's disease without dyskinesia, without mention of fluctuations: Secondary | ICD-10-CM

## 2013-01-24 DIAGNOSIS — G20 Parkinson's disease: Secondary | ICD-10-CM

## 2013-01-24 NOTE — Patient Instructions (Addendum)
1.  Please check at home and see if you have a prescription for klonopin (clonazepam) and IF SO, let me know if you are taking it, how much of it you are taking and what time you are taking it. 2.  Take your carbidopa/levodopa as follows:  2 in the AM, 1 in the afternoon and 1 in the evening 3.  Follow up in 3-4 months

## 2013-01-24 NOTE — Progress Notes (Signed)
Angel Greene was seen today in the movement disorders clinic for f/u re: PD.  [name] notes were reviewed.  The patient reported that she went to her neurosurgeon for f/u for her back and sx's were noted and she was referred to [name] in 2008-09.  [name] dx her with PD.  She has been on requip xl - 12 mg in the past.  She is now supposed to be on carbidopa/levodopa 25/100, 2 tablets in the AM, 1 in the afternoon and 1 before dinner.  Her biggest c/o is vivid dreams, so vivid that she describes them as hallucinations.  They only arise, however, out of sleep. She is also supposed to be on klonopin for RBD.  She cannot recall if she is actually taking it.  I called the pharmacy and it appears that she only picked it up one time in October.  I reviewed her notes, and it did not appear that there was a reason for this.  Since last visit, the patient was hospitalized for near syncope.  She has trouble recounting the exact episode since it was several months ago.  She does tell me about a time when she didn't feel good in church, but her husband reports that this was about the same incident.  Nonetheless, the near syncope was not long after I added Comtan, so it was thought that perhaps this was the etiology.  She has had a history of near syncope in the past with multiple episodes of dizziness in the past even before the addition of Comtan.  While in the hospital, on 07/27/2012, the patient did have a 6 beat run of asymptomatic V. Tach.  01/24/13 update:  The patient is supposed to be on carbidopa/levodopa 25/100, 2 tablets in the morning, one in the afternoon and one in the evening but she is actually on carbidopa/levodopa 25/100, 2 po bid.   Last visit, we did add clonazepam for REM behavior disorder.  She has noted that she is having more hallucinations since last visit.  However, her description of hallucinations reveals that these are not true hallucinations, but rather vivid dreams that feel very real  to her.  She never has hallucinations during the day.  She wakes up and thinks that what happened in a dream was real.  When asked if she was taking her clonazepam, she appeared confused and unsure.  I called the pharmacy and it was last filled on April 28, #30.  Wearing off:  no  How long before next dose:  n/a Falls:   no N/V:  No Hallucinations:  No  visual distortions: no Lightheaded:  Rarely  Syncope: no Dyskinesia:  no     PREVIOUS MEDICATIONS: Requip XL - 12 mg, Lyrica, Pamelor, Seroquel (when with Dr. Thana Farr)  ALLERGIES:  No Known Allergies  CURRENT MEDICATIONS:  Current Outpatient Prescriptions on File Prior to Visit  Medication Sig Dispense Refill  . Calcium Carbonate-Vitamin D (CALTRATE 600+D) 600-400 MG-UNIT per chew tablet Chew 1 tablet by mouth daily.      . carbidopa-levodopa (SINEMET IR) 25-100 MG per tablet Take 1-2 tablets by mouth 3 (three) times daily. 2 tabs in am, 1 tab midday, 1 tab in pm  180 tablet  5  . clonazePAM (KLONOPIN) 0.5 MG tablet Half tab before bed  30 tablet  1  . dofetilide (TIKOSYN) 250 MCG capsule Take 250 mcg by mouth 2 (two) times daily.      . fentaNYL (DURAGESIC - DOSED  MCG/HR) 12 MCG/HR Place 1 patch onto the skin every 3 (three) days.      . furosemide (LASIX) 40 MG tablet Take 20 mg by mouth as needed.       . lidocaine (LIDODERM) 5 % Place 1 patch onto the skin daily. Remove & Discard patch within 12 hours or as directed by MD      . metoprolol tartrate (LOPRESSOR) 25 MG tablet Take 1 tablet (25 mg total) by mouth 2 (two) times daily.  60 tablet  12  . omega-3 acid ethyl esters (LOVAZA) 1 G capsule Take 1 g by mouth daily.      . potassium chloride SA (K-DUR,KLOR-CON) 20 MEQ tablet Take 1 tablet (20 mEq total) by mouth daily.  30 tablet  0  . spironolactone (ALDACTONE) 25 MG tablet Take 25 mg by mouth 2 (two) times daily.       . traMADol (ULTRAM) 50 MG tablet Take 50 mg by mouth every 6 (six) hours as needed. Maximum dose= 8 tablets  per day. PAIN       . warfarin (COUMADIN) 5 MG tablet Take 5 mg by mouth daily.       No current facility-administered medications on file prior to visit.    PAST MEDICAL HISTORY:   Past Medical History  Diagnosis Date  . Atrial fibrillation   . Hypertension     hyperlipidemia-  TEE,CARDIOLYTE STRESS 11/12 with LOV  DR TILLEY AND EKG ON CHART  . Peripheral vascular disease     Hx   DVT x 2  2000  . H/O hiatal hernia   . Bladder infection     06/10/11 with treatment by Dr Timothy Lasso  . Arthritis   . History of breast cancer     2000- breast cancer followed by 6 months of chemo, radiation and  PO meds  . History of DVT (deep vein thrombosis) 06/24/2002  . Parkinsonism     PAST SURGICAL HISTORY:   Past Surgical History  Procedure Laterality Date  . Breast surgery      LUMPECTOMY  WITH AXILLIARY DISSECTION   2000  . Back surgery      2006 - X-Stop spacers placedL3-L4, L4-L5; 2008 - laminectomy and fusion, L3-L5; 2011 Laminectomy and extension of fusion L5-S1  . Tonsillectomy    . Cardiac catheterization      bilateral cataract extraction with IOL  . Cholecystectomy    . Joint replacement      RIGHT HIP REPLACEMENT   1996  . Total knee arthroplasty  06/23/2011    Procedure: TOTAL KNEE ARTHROPLASTY;  Surgeon: Loanne Drilling;  Location: [facility];  Service: Orthopedics;  Laterality: Right;  . Breast lumpectomy    . Mastectomy    . Breast lumpectomy      SOCIAL HISTORY:   History   Social History  . Marital Status: Married    Spouse Name: N/A    Number of Children: N/A  . Years of Education: N/A   Occupational History  . Retired     Runner, broadcasting/film/video, Mudlogger   Social History Main Topics  . Smoking status: Never Smoker   . Smokeless tobacco: Never Used  . Alcohol Use: No  . Drug Use: No  . Sexually Active: Not on file   Other Topics Concern  . Not on file   Social History Narrative  . No narrative on file    FAMILY HISTORY:   Family Status  Relation Status Death Age   .  Mother Deceased     CA, breast  . Father Deceased     CVA  . Brother Deceased     2, MI    ROS:  A complete 10 system review of systems was obtained and was unremarkable apart from what is mentioned above.  PHYSICAL EXAMINATION:    VITALS:   Filed Vitals:   01/24/13 0947  BP: 122/76  Pulse: 68  Temp: 97.9 F (36.6 C)  Resp: 16  Weight: 156 lb (70.761 kg)    GEN:  The patient appears stated age and is in NAD. HEENT:  Normocephalic, atraumatic.  The mucous membranes are moist. The superficial temporal arteries are without ropiness or tenderness. CV:  RRR Lungs:  CTAB Neck/HEME:  There are no carotid bruits bilaterally.  Neurological examination:  Orientation: A complete Mini-Mental status examination was performed today and the patient scored a 25/30. Cranial nerves: There is good facial symmetry. Pupils are equal round and reactive to light bilaterally. Fundoscopic exam reveals clear margins bilaterally. Extraocular muscles are intact. The visual fields are full to confrontational testing. The speech is fluent and clear. Soft palate rises symmetrically and there is no tongue deviation. Hearing is intact to conversational tone. Sensation: Sensation is intact to light and pinprick throughout (facial, trunk, extremities). Vibration is decrease at the bilateral big toe. There is no extinction with double simultaneous stimulation. There is no sensory dermatomal level identified. Motor: Strength is 5/5 in the bilateral upper and lower extremities.   Shoulder shrug is equal and symmetric.  There is no pronator drift. Deep tendon reflexes: Deep tendon reflexes are 1/4 at the bilateral biceps, triceps, brachioradialis, and absent at the patella and achilles. Plantar responses are downgoing bilaterally.  Movement examination: Tone: There is normal tone in both upper extremities today..  The tone in the lower extremities is normal.  Abnormal movements:  There is an intermittent right  upper extremity resting tremor.   Coordination:  There is mild decremation with RAM's Gait and Station: The patient has  significant  difficulty arising out of a deep-seated chair without the use of the hands. The patient's stride length is  decreased with a shuffling gait.  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  She has  stooped posture.  She has an antalgic gait because of left knee pain.  The patient has a  negative pull test.      ASSESSMENT/PLAN: 1.  Idiopathic Parkinson's disease.  This is overall mild.    This is evidenced by tremor, bradykinesia, mild rigidity and mild postural instability.    -She will remain on the carbidopa/levodopa 25/100,  and I encouraged her to take it on the following dosing schedule: 2 tablets in the morning, one in the afternoon and 1 before dinner.    -I am going to hold the entacapone for now, since she had some near syncope after the addition.  However, given the fact that when she was hospitalized she had a 6 beat run of V. tach and given history of near-syncope and dizziness prior to the addition of entacapone, I am not completely convinced that it was the medication.  Nonetheless, we will hold it.   my ability.  The patient expressed understanding and willingness to follow the outlined treatment protocols.  -I. am very convinced that the patient needs PT and OT.  I referred her last visit, but she admits that she did not want to participate and does not want to go now either. 2  REM behavior disorder.  This is commonly associated with Parkinson's disease.    -For the last 2 visits, we have had difficulty telling whether or not the patient is taking her clonazepam.  I think that she takes it until the prescription runs out, forgets to fill it, and then notices increased vivid dreams, which feel like hallucinations to her.  I asked her to go home, look for about clonazepam, and see if she is taking it.  If not, then we definitely need to restart this.  I  asked my medical assistant to followup with her on this as well.  She is not having any true hallucinations. 3.  memory loss.  This is likely related to the Parkinson's disease.  Her husband has noticed a slow decline over the last year but states that she has been stable since last visit and we decided to hold on the [employer], but will continue to consider this.  She is not driving.. 4..  Return in about 3 months (around 04/26/2013). 5.  Time in room:  25 min

## 2013-01-26 ENCOUNTER — Telehealth: Payer: Self-pay

## 2013-01-26 NOTE — Telephone Encounter (Signed)
Pt called back she does have the clonazepam, but wasn't taking it regularly since it said as needed before bed, she thought she didn't need to take it if she wasn't having any problems sleeping or resting.

## 2013-01-26 NOTE — Telephone Encounter (Signed)
Pt aware and will let us know how this works.

## 2013-01-26 NOTE — Telephone Encounter (Signed)
She needs to take it nightly.  Start with 1/2 tab before bedtime and if that takes care of vivid dreams (what she calls hallucinations) then stay at that dose.  If not, then increase to 1 full tab before bedtime and take every single night.

## 2013-03-19 ENCOUNTER — Telehealth: Payer: Self-pay | Admitting: Neurology

## 2013-03-19 NOTE — Telephone Encounter (Signed)
About one month ago Pt started feeling very slow physically and mentally. She said she just feels worn out all the time over nothing. She is concerned her medications could be causing this??

## 2013-03-19 NOTE — Telephone Encounter (Signed)
Might be from the klonopin.  Is she taking that?  If so, how much (1/2 tablet at night?).

## 2013-03-19 NOTE — Telephone Encounter (Signed)
Lethargic episodes, this is not common for her. Could it be related to meds. [phone] / Roanna Raider

## 2013-03-20 NOTE — Telephone Encounter (Signed)
Pt is not taking Klonopin anymore, she said she only took it 1 time then didn't take it again. She is applying 2 Lidocaine patches daily instead of one but that is the only medication change made recently.

## 2013-03-20 NOTE — Telephone Encounter (Signed)
I've looked over her meds and I only have her on carbidopa/levodopa.  That should not cause this sx.  She is on other meds that could be causing it (esp fentanyl patch if she is still on this).  However, I think that the best thing would be to make a f/u with her PCP as I don't think this is PD related if coming on fairly suddenly.

## 2013-03-20 NOTE — Telephone Encounter (Signed)
Called Pt and relayed your message, she is going to call her PCP to make an appointment for a follow up to try and figure out why she is so tired all of the sudden.

## 2013-09-10 ENCOUNTER — Ambulatory Visit (INDEPENDENT_AMBULATORY_CARE_PROVIDER_SITE_OTHER): Payer: Medicare Other | Admitting: Neurology

## 2013-09-10 ENCOUNTER — Encounter: Payer: Self-pay | Admitting: Neurology

## 2013-09-10 VITALS — BP 118/64 | HR 64 | Resp 14 | Ht 66.0 in | Wt 138.0 lb

## 2013-09-10 DIAGNOSIS — F0280 Dementia in other diseases classified elsewhere without behavioral disturbance: Secondary | ICD-10-CM

## 2013-09-10 DIAGNOSIS — G4752 REM sleep behavior disorder: Secondary | ICD-10-CM

## 2013-09-10 DIAGNOSIS — G20 Parkinson's disease: Secondary | ICD-10-CM

## 2013-09-10 DIAGNOSIS — G20A1 Parkinson's disease without dyskinesia, without mention of fluctuations: Secondary | ICD-10-CM

## 2013-09-10 MED ORDER — CARBIDOPA-LEVODOPA 25-100 MG PO TABS: ORAL_TABLET | ORAL | Status: DC

## 2013-09-10 NOTE — Patient Instructions (Signed)
6 month follow up

## 2013-09-10 NOTE — Progress Notes (Signed)
Angel Greene was seen today in the movement disorders clinic for f/u re: PD.  [name] notes were reviewed.  The patient reported that she went to her neurosurgeon for f/u for her back and sx's were noted and she was referred to [name] in 2008-09.  [name] dx her with PD.  She has been on requip xl - 12 mg in the past.  She is now supposed to be on carbidopa/levodopa 25/100, 2 tablets in the AM, 1 in the afternoon and 1 before dinner.  Her biggest c/o is vivid dreams, so vivid that she describes them as hallucinations.  They only arise, however, out of sleep. She is also supposed to be on klonopin for RBD.  She cannot recall if she is actually taking it.  I called the pharmacy and it appears that she only picked it up one time in October.  I reviewed her notes, and it did not appear that there was a reason for this.  Since last visit, the patient was hospitalized for near syncope.  She has trouble recounting the exact episode since it was several months ago.  She does tell me about a time when she didn't feel good in church, but her husband reports that this was about the same incident.  Nonetheless, the near syncope was not long after I added Comtan, so it was thought that perhaps this was the etiology.  She has had a history of near syncope in the past with multiple episodes of dizziness in the past even before the addition of Comtan.  While in the hospital, on 07/27/2012, the patient did have a 6 beat run of asymptomatic V. Tach.  01/24/13 update:  The patient is supposed to be on carbidopa/levodopa 25/100, 2 tablets in the morning, one in the afternoon and one in the evening but she is actually on carbidopa/levodopa 25/100, 2 po bid.   Last visit, we did add clonazepam for REM behavior disorder.  She has noted that she is having more hallucinations since last visit.  However, her description of hallucinations reveals that these are not true hallucinations, but rather vivid dreams that feel very real  to her.  She never has hallucinations during the day.  She wakes up and thinks that what happened in a dream was real.  When asked if she was taking her clonazepam, she appeared confused and unsure.  I called the pharmacy and it was last filled on April 28, #30.  09/10/13 update:  The patient presents today, accompanied by her husband who helps supplement the history.  The patient has a history of mild Parkinson's disease.  She is on carbidopa/levodopa 25/100.  Last visit, I changed her from taking 2 tablets by mouth twice a day to 2 tablets in the morning, one at lunch and 2 in the evening.  She admits that she may miss the middle of the day dosing.  She is administering her medications.  She has refused physical therapy.  The patient does have REM behavior disorder.  Intermittently, we have prescribed clonazepam, but the last time that she tried it she only took it one time and then discontinued it.  I did review notes from her primary care physician.  There continues to be concerned both from her primary care physician as well as myself regarding memory.  Her husband states that they have looked into [facility] but haven't filled out any applications yet.    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 Thana Farr)  ALLERGIES:  No Known Allergies  CURRENT MEDICATIONS:  Current Outpatient Prescriptions on File Prior to Visit  Medication Sig Dispense Refill  . Calcium Carbonate-Vitamin D (CALTRATE 600+D) 600-400 MG-UNIT per chew tablet Chew 1 tablet by mouth daily.      . ciprofloxacin (CIPRO) 250 MG tablet Take 250 mg by mouth 2 (two) times daily.      Marland Kitchen dofetilide (TIKOSYN) 250 MCG capsule Take 250 mcg by mouth 2 (two) times  daily.      . fentaNYL (DURAGESIC - DOSED MCG/HR) 12 MCG/HR Place 1 patch onto the skin every 3 (three) days.      . furosemide (LASIX) 40 MG tablet Take 20 mg by mouth as needed.       . lidocaine (LIDODERM) 5 % Place 1 patch onto the skin daily. Remove & Discard patch within 12 hours or as directed by MD      . metoprolol tartrate (LOPRESSOR) 25 MG tablet Take 1 tablet (25 mg total) by mouth 2 (two) times daily.  60 tablet  12  . omega-3 acid ethyl esters (LOVAZA) 1 G capsule Take 1 g by mouth daily.      . potassium chloride SA (K-DUR,KLOR-CON) 20 MEQ tablet Take 1 tablet (20 mEq total) by mouth daily.  30 tablet  0  . spironolactone (ALDACTONE) 25 MG tablet Take 25 mg by mouth 2 (two) times daily.       . traMADol (ULTRAM) 50 MG tablet Take 50 mg by mouth every 6 (six) hours as needed. Maximum dose= 8 tablets per day. PAIN       . warfarin (COUMADIN) 5 MG tablet Take 5 mg by mouth daily.       No current facility-administered medications on file prior to visit.    PAST MEDICAL HISTORY:   Past Medical History  Diagnosis Date  . Atrial fibrillation   . Hypertension     hyperlipidemia-  TEE,CARDIOLYTE STRESS 11/12 with LOV  DR TILLEY AND EKG ON CHART  . Peripheral vascular disease     Hx   DVT x 2  2000  . H/O hiatal hernia   . Bladder infection     06/10/11 with treatment by Dr Timothy Lasso  . Arthritis   . History of breast cancer     2000- breast cancer followed by 6 months of chemo, radiation and  PO meds  . History of DVT (deep vein thrombosis) 06/24/2002  . Parkinsonism     PAST SURGICAL HISTORY:   Past Surgical History  Procedure Laterality Date  . Breast surgery      LUMPECTOMY  WITH AXILLIARY DISSECTION   2000  . Back surgery      2006 - X-Stop spacers placedL3-L4, L4-L5; 2008 - laminectomy and fusion, L3-L5; 2011 Laminectomy and extension of fusion L5-S1  . Tonsillectomy    . Cardiac catheterization      bilateral cataract extraction with IOL  . Cholecystectomy    .  Joint replacement      RIGHT HIP REPLACEMENT   1996  . Total knee arthroplasty  06/23/2011    Procedure: TOTAL KNEE ARTHROPLASTY;  Surgeon: Loanne Drilling;  Location: [facility];  Service: Orthopedics;  Laterality: Right;  . Breast lumpectomy    . Mastectomy    . Breast lumpectomy      SOCIAL HISTORY:   History   Social History  . Marital Status: Married    Spouse Name: N/A    Number of Children: N/A  . Years of Education: N/A   Occupational History  . Retired     Runner, broadcasting/film/videoTeacher, [employer]   Social History Main Topics  . Smoking status: Never Smoker   . Smokeless tobacco: Never Used  . Alcohol Use: No  . Drug Use: No  . Sexual Activity: Not on file   Other Topics Concern  . Not on file   Social History Narrative  . No narrative on file    FAMILY HISTORY:   Family Status  Relation Status Death Age  . Mother Deceased     CA, breast  . Father Deceased     CVA  . Brother Deceased     2, MI    ROS:  A complete 10 system review of systems was obtained and was unremarkable apart from what is mentioned above.  PHYSICAL EXAMINATION:    VITALS:   Filed Vitals:   09/10/13 1111  BP: 118/64  Pulse: 64  Resp: 14  Height: 5\' 6"  (1.676 m)  Weight: 138 lb (62.596 kg)    GEN:  The patient appears stated age and is in NAD. HEENT:  Normocephalic, atraumatic.  The mucous membranes are moist. The superficial temporal arteries are without ropiness or tenderness. CV:  RRR Lungs:  CTAB Neck/HEME:  There are no carotid bruits bilaterally.  Neurological examination:  Orientation: A MoCA was done today and the pt scored 14/30.  Oriented to month/date/year. Cranial nerves: There is good facial symmetry. Pupils are equal round and reactive to light bilaterally. Fundoscopic exam reveals clear margins bilaterally. Extraocular muscles are intact. The visual fields are full to confrontational testing. The speech is fluent and clear. Soft palate rises symmetrically and there is no tongue  deviation. Hearing is intact to conversational tone. Sensation: Sensation is intact to light and pinprick throughout (facial, trunk, extremities). Vibration is decrease at the bilateral big toe. There is no extinction with double simultaneous stimulation. There is no sensory dermatomal level identified. Motor: Strength is 5/5 in the bilateral upper and lower extremities.   Shoulder shrug is equal and symmetric.  There is no pronator drift. Deep tendon reflexes: Deep tendon reflexes are 1/4 at the bilateral biceps, triceps, brachioradialis, and absent at the patella and achilles. Plantar responses are downgoing bilaterally.  Movement examination: Tone: There is normal tone in both upper extremities today..  The tone in the lower extremities is normal.  Abnormal movements:  There is an intermittent right upper extremity resting tremor.   Coordination:  There is mild decremation with RAM's Gait and Station: The patient has  significant  difficulty arising out of a deep-seated chair without the use of the hands. The patient's stride length is  decreased but less shuffling today.  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  She has  stooped posture.  She has an antalgic gait because of left knee pain.  The patient has a  negative pull test.      ASSESSMENT/PLAN: 1.  Idiopathic Parkinson's disease.  This is overall mild.    This is evidenced by tremor, bradykinesia, mild rigidity and mild postural instability.    -She will remain on the carbidopa/levodopa 25/100,  and I encouraged her  to take it on the following dosing schedule: 2 tablets in the morning, one in the afternoon and 2 before dinner.    -I am going to hold the entacapone for now, since she had some near syncope after the addition.  However, given the fact that when she was hospitalized she had a 6 beat run of V. tach and given history of near-syncope and dizziness prior to the addition of entacapone, I am not completely convinced that it  was the medication.  Nonetheless, we will hold it.   my ability.  The patient expressed understanding and willingness to follow the outlined treatment protocols.  -I. am very convinced that the patient needs PT and OT.  She doesn't wish to go.  -I talked to them about her husband taking over the medications. 2  REM behavior disorder.  This is commonly associated with Parkinson's disease.    -On no meds.  Tried klonopin but not consistently 3.  memory loss.  This is likely related to the Parkinson's disease.    -Length discussion about safety.  They refused acetylcholinesterase inhibitors.  We discussed safety.  Would like to see them continue to pursue [facility].  Greater than 50% in counseling. 4..  Return in about 6 months (around 03/13/2014). 5.  Time in room:  25 min

## 2013-09-19 ENCOUNTER — Other Ambulatory Visit: Payer: Self-pay

## 2013-09-19 DIAGNOSIS — Z1231 Encounter for screening mammogram for malignant neoplasm of breast: Secondary | ICD-10-CM

## 2013-10-01 ENCOUNTER — Ambulatory Visit: Payer: Medicare Other

## 2013-10-01 ENCOUNTER — Ambulatory Visit
Admission: RE | Admit: 2013-10-01 | Discharge: 2013-10-01 | Disposition: A | Discharge status: Home / Self Care | Payer: Medicare Other | Source: Ambulatory Visit

## 2013-10-01 DIAGNOSIS — Z1231 Encounter for screening mammogram for malignant neoplasm of breast: Secondary | ICD-10-CM

## 2013-10-09 ENCOUNTER — Other Ambulatory Visit: Payer: Self-pay | Admitting: Emergency Medicine

## 2013-10-09 DIAGNOSIS — R928 Other abnormal and inconclusive findings on diagnostic imaging of breast: Secondary | ICD-10-CM

## 2013-10-22 ENCOUNTER — Other Ambulatory Visit: Payer: Self-pay | Admitting: Internal Medicine

## 2013-10-22 ENCOUNTER — Ambulatory Visit
Admission: RE | Admit: 2013-10-22 | Discharge: 2013-10-22 | Disposition: A | Discharge status: Home / Self Care | Payer: Medicare Other | Source: Ambulatory Visit | Attending: Emergency Medicine | Admitting: Emergency Medicine

## 2013-10-22 DIAGNOSIS — R928 Other abnormal and inconclusive findings on diagnostic imaging of breast: Secondary | ICD-10-CM

## 2013-10-29 ENCOUNTER — Other Ambulatory Visit: Payer: Medicare Other

## 2014-01-12 ENCOUNTER — Encounter (HOSPITAL_COMMUNITY): Payer: Self-pay | Admitting: Emergency Medicine

## 2014-01-12 ENCOUNTER — Emergency Department (HOSPITAL_COMMUNITY): Payer: Medicare Other

## 2014-01-12 ENCOUNTER — Inpatient Hospital Stay (HOSPITAL_COMMUNITY)
Admission: EM | Admit: 2014-01-12 | Discharge: 2014-01-15 | DRG: 470 | Disposition: A | Discharge status: Skilled Nursing Facility | Payer: Medicare Other | Attending: Internal Medicine | Admitting: Internal Medicine

## 2014-01-12 DIAGNOSIS — Z96649 Presence of unspecified artificial hip joint: Secondary | ICD-10-CM

## 2014-01-12 DIAGNOSIS — Z853 Personal history of malignant neoplasm of breast: Secondary | ICD-10-CM

## 2014-01-12 DIAGNOSIS — I10 Essential (primary) hypertension: Secondary | ICD-10-CM | POA: Diagnosis present

## 2014-01-12 DIAGNOSIS — W010XXA Fall on same level from slipping, tripping and stumbling without subsequent striking against object, initial encounter: Secondary | ICD-10-CM | POA: Diagnosis present

## 2014-01-12 DIAGNOSIS — F0280 Dementia in other diseases classified elsewhere without behavioral disturbance: Secondary | ICD-10-CM

## 2014-01-12 DIAGNOSIS — Z981 Arthrodesis status: Secondary | ICD-10-CM

## 2014-01-12 DIAGNOSIS — S72009A Fracture of unspecified part of neck of unspecified femur, initial encounter for closed fracture: Secondary | ICD-10-CM

## 2014-01-12 DIAGNOSIS — Z9849 Cataract extraction status, unspecified eye: Secondary | ICD-10-CM

## 2014-01-12 DIAGNOSIS — I4891 Unspecified atrial fibrillation: Secondary | ICD-10-CM | POA: Diagnosis present

## 2014-01-12 DIAGNOSIS — S72033A Displaced midcervical fracture of unspecified femur, initial encounter for closed fracture: Principal | ICD-10-CM | POA: Diagnosis present

## 2014-01-12 DIAGNOSIS — Z86718 Personal history of other venous thrombosis and embolism: Secondary | ICD-10-CM

## 2014-01-12 DIAGNOSIS — G20A1 Parkinson's disease without dyskinesia, without mention of fluctuations: Secondary | ICD-10-CM | POA: Diagnosis present

## 2014-01-12 DIAGNOSIS — G20 Parkinson's disease: Secondary | ICD-10-CM | POA: Diagnosis present

## 2014-01-12 DIAGNOSIS — I482 Chronic atrial fibrillation, unspecified: Secondary | ICD-10-CM

## 2014-01-12 DIAGNOSIS — S72002A Fracture of unspecified part of neck of left femur, initial encounter for closed fracture: Secondary | ICD-10-CM

## 2014-01-12 DIAGNOSIS — Y92009 Unspecified place in unspecified non-institutional (private) residence as the place of occurrence of the external cause: Secondary | ICD-10-CM

## 2014-01-12 DIAGNOSIS — G8929 Other chronic pain: Secondary | ICD-10-CM | POA: Diagnosis present

## 2014-01-12 DIAGNOSIS — Z86711 Personal history of pulmonary embolism: Secondary | ICD-10-CM

## 2014-01-12 DIAGNOSIS — I2699 Other pulmonary embolism without acute cor pulmonale: Secondary | ICD-10-CM | POA: Diagnosis present

## 2014-01-12 DIAGNOSIS — Z7901 Long term (current) use of anticoagulants: Secondary | ICD-10-CM

## 2014-01-12 DIAGNOSIS — Z96659 Presence of unspecified artificial knee joint: Secondary | ICD-10-CM

## 2014-01-12 LAB — CBC WITH DIFFERENTIAL/PLATELET
Basophils Absolute: 0 10*3/uL (ref 0.0–0.1)
Basophils Relative: 0 % (ref 0–1)
EOS ABS: 0.1 10*3/uL (ref 0.0–0.7)
EOS PCT: 1 % (ref 0–5)
HCT: 45.8 % (ref 36.0–46.0)
HEMOGLOBIN: 15.3 g/dL — AB (ref 12.0–15.0)
LYMPHS ABS: 1.3 10*3/uL (ref 0.7–4.0)
LYMPHS PCT: 15 % (ref 12–46)
MCH: 31.8 pg (ref 26.0–34.0)
MCHC: 33.4 g/dL (ref 30.0–36.0)
MCV: 95.2 fL (ref 78.0–100.0)
MONO ABS: 0.5 10*3/uL (ref 0.1–1.0)
MONOS PCT: 6 % (ref 3–12)
Neutro Abs: 7 10*3/uL (ref 1.7–7.7)
Neutrophils Relative %: 78 % — ABNORMAL HIGH (ref 43–77)
Platelets: 223 10*3/uL (ref 150–400)
RBC: 4.81 MIL/uL (ref 3.87–5.11)
RDW: 14.8 % (ref 11.5–15.5)
WBC: 8.9 10*3/uL (ref 4.0–10.5)

## 2014-01-12 LAB — BASIC METABOLIC PANEL
Anion gap: 15 (ref 5–15)
BUN: 33 mg/dL — AB (ref 6–23)
CALCIUM: 10.5 mg/dL (ref 8.4–10.5)
CO2: 29 meq/L (ref 19–32)
CREATININE: 1.03 mg/dL (ref 0.50–1.10)
Chloride: 95 mEq/L — ABNORMAL LOW (ref 96–112)
GFR calc Af Amer: 57 mL/min — ABNORMAL LOW (ref >90)
GFR, EST NON AFRICAN AMERICAN: 49 mL/min — AB (ref >90)
GLUCOSE: 116 mg/dL — AB (ref 70–99)
Potassium: 4.5 mEq/L (ref 3.7–5.3)
Sodium: 139 mEq/L (ref 137–147)

## 2014-01-12 LAB — URINALYSIS, ROUTINE W REFLEX MICROSCOPIC
BILIRUBIN URINE: NEGATIVE
Glucose, UA: NEGATIVE mg/dL
HGB URINE DIPSTICK: NEGATIVE
Ketones, ur: NEGATIVE mg/dL
Nitrite: NEGATIVE
Protein, ur: NEGATIVE mg/dL
Specific Gravity, Urine: 1.019 (ref 1.005–1.030)
UROBILINOGEN UA: 0.2 mg/dL (ref 0.0–1.0)
pH: 5 (ref 5.0–8.0)

## 2014-01-12 LAB — PROTIME-INR
INR: 2.68 — ABNORMAL HIGH (ref 0.00–1.49)
PROTHROMBIN TIME: 28.5 s — AB (ref 11.6–15.2)

## 2014-01-12 LAB — URINE MICROSCOPIC-ADD ON

## 2014-01-12 MED ORDER — SODIUM CHLORIDE 0.9 % IV SOLN
20.0000 mL | INTRAVENOUS | Status: DC
  Administered 2014-01-12: 20 mL via INTRAVENOUS

## 2014-01-12 MED ORDER — ONDANSETRON HCL 4 MG/2ML IJ SOLN
4.0000 mg | Freq: Once | INTRAMUSCULAR | Status: AC
  Administered 2014-01-12: 4 mg via INTRAVENOUS
  Filled 2014-01-12: qty 2

## 2014-01-12 MED ORDER — FENTANYL CITRATE 0.05 MG/ML IJ SOLN
50.0000 ug | INTRAMUSCULAR | Status: DC | PRN
  Administered 2014-01-13: 50 ug via INTRAVENOUS
  Filled 2014-01-12: qty 2

## 2014-01-12 MED ORDER — HYDROMORPHONE HCL PF 1 MG/ML IJ SOLN
0.5000 mg | INTRAMUSCULAR | Status: DC | PRN
  Administered 2014-01-12: 0.5 mg via INTRAVENOUS
  Filled 2014-01-12: qty 1

## 2014-01-12 NOTE — ED Notes (Addendum)
Left hip pain. Patient fell backwards at 9am and has not been able to walk since then. Patient has had Right hip replacement. Patient needs left knee replacement.

## 2014-01-13 ENCOUNTER — Inpatient Hospital Stay (HOSPITAL_COMMUNITY): Payer: Medicare Other

## 2014-01-13 ENCOUNTER — Emergency Department (HOSPITAL_COMMUNITY): Payer: Medicare Other

## 2014-01-13 ENCOUNTER — Encounter (HOSPITAL_COMMUNITY): Payer: Medicare Other | Admitting: Anesthesiology

## 2014-01-13 ENCOUNTER — Inpatient Hospital Stay (HOSPITAL_COMMUNITY): Payer: Medicare Other | Admitting: Anesthesiology

## 2014-01-13 ENCOUNTER — Encounter (HOSPITAL_COMMUNITY): Payer: Self-pay | Admitting: *Deleted

## 2014-01-13 ENCOUNTER — Encounter (HOSPITAL_COMMUNITY)
Admission: EM | Disposition: A | Discharge status: Skilled Nursing Facility | Payer: Self-pay | Source: Home / Self Care | Attending: Internal Medicine

## 2014-01-13 DIAGNOSIS — Z86718 Personal history of other venous thrombosis and embolism: Secondary | ICD-10-CM

## 2014-01-13 DIAGNOSIS — S72009A Fracture of unspecified part of neck of unspecified femur, initial encounter for closed fracture: Secondary | ICD-10-CM

## 2014-01-13 DIAGNOSIS — I4891 Unspecified atrial fibrillation: Secondary | ICD-10-CM

## 2014-01-13 DIAGNOSIS — G20 Parkinson's disease: Secondary | ICD-10-CM

## 2014-01-13 DIAGNOSIS — S72002A Fracture of unspecified part of neck of left femur, initial encounter for closed fracture: Secondary | ICD-10-CM

## 2014-01-13 DIAGNOSIS — F0280 Dementia in other diseases classified elsewhere without behavioral disturbance: Secondary | ICD-10-CM

## 2014-01-13 HISTORY — PX: HIP ARTHROPLASTY: SHX981

## 2014-01-13 LAB — CBC
HCT: 41.7 % (ref 36.0–46.0)
Hemoglobin: 13.7 g/dL (ref 12.0–15.0)
MCH: 31.5 pg (ref 26.0–34.0)
MCHC: 32.9 g/dL (ref 30.0–36.0)
MCV: 95.9 fL (ref 78.0–100.0)
PLATELETS: 196 10*3/uL (ref 150–400)
RBC: 4.35 MIL/uL (ref 3.87–5.11)
RDW: 15.3 % (ref 11.5–15.5)
WBC: 7.5 10*3/uL (ref 4.0–10.5)

## 2014-01-13 LAB — TYPE AND SCREEN
ABO/RH(D): AB POS
ANTIBODY SCREEN: NEGATIVE

## 2014-01-13 LAB — SURGICAL PCR SCREEN
MRSA, PCR: NEGATIVE
STAPHYLOCOCCUS AUREUS: NEGATIVE

## 2014-01-13 LAB — CREATININE, SERUM
Creatinine, Ser: 0.95 mg/dL (ref 0.50–1.10)
GFR calc non Af Amer: 54 mL/min — ABNORMAL LOW (ref >90)
GFR, EST AFRICAN AMERICAN: 63 mL/min — AB (ref >90)

## 2014-01-13 LAB — PROTIME-INR
INR: 1.58 — AB (ref 0.00–1.49)
PROTHROMBIN TIME: 18.9 s — AB (ref 11.6–15.2)

## 2014-01-13 SURGERY — HEMIARTHROPLASTY, HIP, DIRECT ANTERIOR APPROACH, FOR FRACTURE
Anesthesia: General | Site: Hip | Laterality: Left

## 2014-01-13 MED ORDER — FENTANYL CITRATE 0.05 MG/ML IJ SOLN: 25.0000 ug | INTRAMUSCULAR | Status: DC | PRN

## 2014-01-13 MED ORDER — CARBIDOPA-LEVODOPA 25-100 MG PO TABS
2.0000 | ORAL_TABLET | Freq: Two times a day (BID) | ORAL | Status: DC
  Administered 2014-01-13 – 2014-01-15 (×6): 2 via ORAL
  Filled 2014-01-13 (×7): qty 2

## 2014-01-13 MED ORDER — METOCLOPRAMIDE HCL 5 MG/ML IJ SOLN: 5.0000 mg | Freq: Three times a day (TID) | INTRAMUSCULAR | Status: DC | PRN

## 2014-01-13 MED ORDER — LIDOCAINE HCL (CARDIAC) 20 MG/ML IV SOLN
INTRAVENOUS | Status: AC
  Filled 2014-01-13: qty 5

## 2014-01-13 MED ORDER — MORPHINE SULFATE 2 MG/ML IJ SOLN: 0.5000 mg | INTRAMUSCULAR | Status: DC | PRN

## 2014-01-13 MED ORDER — FENTANYL CITRATE 0.05 MG/ML IJ SOLN
INTRAMUSCULAR | Status: DC | PRN
  Administered 2014-01-13 (×2): 50 ug via INTRAVENOUS
  Administered 2014-01-13: 100 ug via INTRAVENOUS
  Administered 2014-01-13: 50 ug via INTRAVENOUS

## 2014-01-13 MED ORDER — PROPOFOL 10 MG/ML IV BOLUS
INTRAVENOUS | Status: AC
  Filled 2014-01-13: qty 20

## 2014-01-13 MED ORDER — LACTATED RINGERS IV SOLN: INTRAVENOUS | Status: DC

## 2014-01-13 MED ORDER — ONDANSETRON HCL 4 MG/2ML IJ SOLN
INTRAMUSCULAR | Status: DC | PRN
  Administered 2014-01-13: 4 mg via INTRAVENOUS

## 2014-01-13 MED ORDER — PHENYLEPHRINE 40 MCG/ML (10ML) SYRINGE FOR IV PUSH (FOR BLOOD PRESSURE SUPPORT)
PREFILLED_SYRINGE | INTRAVENOUS | Status: AC
  Filled 2014-01-13: qty 10

## 2014-01-13 MED ORDER — SUCCINYLCHOLINE CHLORIDE 20 MG/ML IJ SOLN
INTRAMUSCULAR | Status: DC | PRN
  Administered 2014-01-13: 100 mg via INTRAVENOUS

## 2014-01-13 MED ORDER — ONDANSETRON HCL 4 MG/2ML IJ SOLN
INTRAMUSCULAR | Status: AC
  Filled 2014-01-13: qty 2

## 2014-01-13 MED ORDER — 0.9 % SODIUM CHLORIDE (POUR BTL) OPTIME
TOPICAL | Status: DC | PRN
  Administered 2014-01-13: 1000 mL

## 2014-01-13 MED ORDER — FENTANYL 12 MCG/HR TD PT72: 12.5000 ug | MEDICATED_PATCH | TRANSDERMAL | Status: DC

## 2014-01-13 MED ORDER — HYDROCODONE-ACETAMINOPHEN 5-325 MG PO TABS: 1.0000 | ORAL_TABLET | Freq: Four times a day (QID) | ORAL | Status: DC | PRN

## 2014-01-13 MED ORDER — PHENOL 1.4 % MT LIQD: 1.0000 | OROMUCOSAL | Status: DC | PRN

## 2014-01-13 MED ORDER — ONDANSETRON HCL 4 MG PO TABS: 4.0000 mg | ORAL_TABLET | Freq: Four times a day (QID) | ORAL | Status: DC | PRN

## 2014-01-13 MED ORDER — SODIUM CHLORIDE 0.9 % IV SOLN
INTRAVENOUS | Status: DC
Start: 2014-01-13 — End: 2014-01-15
  Administered 2014-01-13: 50 mL/h via INTRAVENOUS
  Administered 2014-01-14 – 2014-01-15 (×2): via INTRAVENOUS

## 2014-01-13 MED ORDER — ACETAMINOPHEN 650 MG RE SUPP: 650.0000 mg | Freq: Four times a day (QID) | RECTAL | Status: DC | PRN

## 2014-01-13 MED ORDER — VITAMIN K1 10 MG/ML IJ SOLN
10.0000 mg | Freq: Once | INTRAVENOUS | Status: AC
  Administered 2014-01-13: 10 mg via INTRAVENOUS
  Filled 2014-01-13: qty 1

## 2014-01-13 MED ORDER — LIDOCAINE HCL (PF) 2 % IJ SOLN
INTRAMUSCULAR | Status: DC | PRN
  Administered 2014-01-13: 75 mg via INTRADERMAL

## 2014-01-13 MED ORDER — MENTHOL 3 MG MT LOZG: 1.0000 | LOZENGE | OROMUCOSAL | Status: DC | PRN

## 2014-01-13 MED ORDER — PROMETHAZINE HCL 25 MG/ML IJ SOLN: 6.2500 mg | INTRAMUSCULAR | Status: DC | PRN

## 2014-01-13 MED ORDER — CHLORHEXIDINE GLUCONATE 4 % EX LIQD: 60.0000 mL | Freq: Once | CUTANEOUS | Status: DC

## 2014-01-13 MED ORDER — HYDROCODONE-ACETAMINOPHEN 5-325 MG PO TABS
1.0000 | ORAL_TABLET | Freq: Four times a day (QID) | ORAL | Status: DC | PRN
  Administered 2014-01-15: 2 via ORAL
  Filled 2014-01-13: qty 2

## 2014-01-13 MED ORDER — WARFARIN SODIUM 5 MG PO TABS: 5.0000 mg | ORAL_TABLET | Freq: Every day | ORAL | Status: DC

## 2014-01-13 MED ORDER — CIPROFLOXACIN HCL 500 MG PO TABS: 250.0000 mg | ORAL_TABLET | Freq: Two times a day (BID) | ORAL | Status: DC

## 2014-01-13 MED ORDER — FENTANYL CITRATE 0.05 MG/ML IJ SOLN
INTRAMUSCULAR | Status: AC
  Filled 2014-01-13: qty 5

## 2014-01-13 MED ORDER — CEFAZOLIN SODIUM-DEXTROSE 2-3 GM-% IV SOLR
2.0000 g | Freq: Four times a day (QID) | INTRAVENOUS | Status: AC
  Administered 2014-01-13 – 2014-01-14 (×2): 2 g via INTRAVENOUS
  Filled 2014-01-13 (×2): qty 50

## 2014-01-13 MED ORDER — METOCLOPRAMIDE HCL 10 MG PO TABS: 5.0000 mg | ORAL_TABLET | Freq: Three times a day (TID) | ORAL | Status: DC | PRN

## 2014-01-13 MED ORDER — CHLORHEXIDINE GLUCONATE 4 % EX LIQD
60.0000 mL | Freq: Once | CUTANEOUS | Status: DC
  Filled 2014-01-13: qty 60

## 2014-01-13 MED ORDER — PROPOFOL 10 MG/ML IV BOLUS
INTRAVENOUS | Status: DC | PRN
  Administered 2014-01-13: 100 mg via INTRAVENOUS

## 2014-01-13 MED ORDER — ONDANSETRON HCL 4 MG/2ML IJ SOLN: 4.0000 mg | Freq: Four times a day (QID) | INTRAMUSCULAR | Status: DC | PRN

## 2014-01-13 MED ORDER — CEFAZOLIN SODIUM-DEXTROSE 2-3 GM-% IV SOLR
2.0000 g | INTRAVENOUS | Status: AC
  Administered 2014-01-13: 2 g via INTRAVENOUS
  Filled 2014-01-13 (×2): qty 50

## 2014-01-13 MED ORDER — PHENYLEPHRINE HCL 10 MG/ML IJ SOLN
INTRAMUSCULAR | Status: DC | PRN
  Administered 2014-01-13 (×3): 80 ug via INTRAVENOUS

## 2014-01-13 MED ORDER — ACETAMINOPHEN 325 MG PO TABS
650.0000 mg | ORAL_TABLET | Freq: Four times a day (QID) | ORAL | Status: DC | PRN
  Administered 2014-01-13 – 2014-01-15 (×5): 650 mg via ORAL
  Filled 2014-01-13 (×5): qty 2

## 2014-01-13 MED ORDER — ENOXAPARIN SODIUM 40 MG/0.4ML ~~LOC~~ SOLN
40.0000 mg | SUBCUTANEOUS | Status: DC
  Administered 2014-01-14 – 2014-01-15 (×2): 40 mg via SUBCUTANEOUS
  Filled 2014-01-13 (×3): qty 0.4

## 2014-01-13 MED ORDER — CEFAZOLIN SODIUM-DEXTROSE 2-3 GM-% IV SOLR: 2.0000 g | INTRAVENOUS | Status: DC

## 2014-01-13 MED ORDER — CARBIDOPA-LEVODOPA 25-100 MG PO TABS
1.0000 | ORAL_TABLET | Freq: Two times a day (BID) | ORAL | Status: DC
  Filled 2014-01-13 (×3): qty 1

## 2014-01-13 MED ORDER — ACETAMINOPHEN 325 MG PO TABS: 650.0000 mg | ORAL_TABLET | Freq: Four times a day (QID) | ORAL | Status: AC | PRN

## 2014-01-13 MED ORDER — LACTATED RINGERS IV SOLN
INTRAVENOUS | Status: DC | PRN
  Administered 2014-01-13 (×2): via INTRAVENOUS

## 2014-01-13 MED ORDER — DOFETILIDE 250 MCG PO CAPS
250.0000 ug | ORAL_CAPSULE | Freq: Two times a day (BID) | ORAL | Status: DC
  Administered 2014-01-13 – 2014-01-15 (×5): 250 ug via ORAL
  Filled 2014-01-13 (×6): qty 1

## 2014-01-13 MED ORDER — CARBIDOPA-LEVODOPA 25-100 MG PO TABS
2.0000 | ORAL_TABLET | ORAL | Status: DC
  Filled 2014-01-13: qty 2

## 2014-01-13 MED ORDER — METOPROLOL TARTRATE 25 MG PO TABS
25.0000 mg | ORAL_TABLET | Freq: Two times a day (BID) | ORAL | Status: DC
  Administered 2014-01-13 – 2014-01-15 (×4): 25 mg via ORAL
  Filled 2014-01-13 (×6): qty 1

## 2014-01-13 MED ORDER — CARBIDOPA-LEVODOPA 25-100 MG PO TABS
1.0000 | ORAL_TABLET | ORAL | Status: DC
  Administered 2014-01-14: 1 via ORAL
  Filled 2014-01-13 (×3): qty 1

## 2014-01-13 SURGICAL SUPPLY — 35 items
BAG SPEC THK2 15X12 ZIP CLS (MISCELLANEOUS) ×1
BAG ZIPLOCK 12X15 (MISCELLANEOUS) ×2 IMPLANT
BLADE SAW SAG 73X25 THK (BLADE) ×1
BLADE SAW SGTL 73X25 THK (BLADE) ×1 IMPLANT
CAPT HIP HD POR BIPOL/UNIPOL ×1 IMPLANT
DRAPE INCISE IOBAN 66X45 STRL (DRAPES) ×2 IMPLANT
DRAPE ORTHO SPLIT 77X108 STRL (DRAPES) ×4
DRAPE POUCH INSTRU U-SHP 10X18 (DRAPES) ×2 IMPLANT
DRAPE SURG ORHT 6 SPLT 77X108 (DRAPES) ×2 IMPLANT
DRAPE U-SHAPE 47X51 STRL (DRAPES) ×2 IMPLANT
DRSG EMULSION OIL 3X16 NADH (GAUZE/BANDAGES/DRESSINGS) ×2 IMPLANT
DRSG MEPILEX BORDER 4X4 (GAUZE/BANDAGES/DRESSINGS) IMPLANT
DRSG MEPILEX BORDER 4X8 (GAUZE/BANDAGES/DRESSINGS) ×2 IMPLANT
ELECT REM PT RETURN 9FT ADLT (ELECTROSURGICAL) ×2
ELECTRODE REM PT RTRN 9FT ADLT (ELECTROSURGICAL) ×1 IMPLANT
EVACUATOR 1/8 PVC DRAIN (DRAIN) IMPLANT
GAUZE SPONGE 4X4 12PLY STRL (GAUZE/BANDAGES/DRESSINGS) IMPLANT
GLOVE ORTHO TXT STRL SZ7.5 (GLOVE) ×2 IMPLANT
GLOVE SURG ORTHO 8.5 STRL (GLOVE) ×2 IMPLANT
GOWN STRL REUS W/TWL LRG LVL3 (GOWN DISPOSABLE) ×4 IMPLANT
IMMOBILIZER KNEE 20 (SOFTGOODS)
IMMOBILIZER KNEE 20 THIGH 36 (SOFTGOODS) IMPLANT
MANIFOLD NEPTUNE II (INSTRUMENTS) ×2 IMPLANT
PACK TOTAL JOINT (CUSTOM PROCEDURE TRAY) ×2 IMPLANT
POSITIONER SURGICAL ARM (MISCELLANEOUS) ×2 IMPLANT
STAPLER VISISTAT 35W (STAPLE) ×2 IMPLANT
STRIP CLOSURE SKIN 1/2X4 (GAUZE/BANDAGES/DRESSINGS) ×1 IMPLANT
SUT ETHIBOND NAB CT1 #1 30IN (SUTURE) ×6 IMPLANT
SUT MNCRL AB 4-0 PS2 18 (SUTURE) IMPLANT
SUT VIC AB 1 CT1 36 (SUTURE) ×4 IMPLANT
SUT VIC AB 2-0 CT1 27 (SUTURE) ×4
SUT VIC AB 2-0 CT1 TAPERPNT 27 (SUTURE) ×2 IMPLANT
TOWEL OR 17X26 10 PK STRL BLUE (TOWEL DISPOSABLE) ×4 IMPLANT
TOWER CARTRIDGE SMART MIX (DISPOSABLE) ×1 IMPLANT
TRAY FOLEY CATH 14FRSI W/METER (CATHETERS) IMPLANT

## 2014-01-13 NOTE — Anesthesia Preprocedure Evaluation (Signed)
Anesthesia Evaluation  Patient identified by MRN, date of birth, ID band Patient awake    Reviewed: Allergy & Precautions, H&P , NPO status , Patient's Chart, lab work & pertinent test results  Airway Mallampati: II TM Distance: >3 FB Neck ROM: Full    Dental no notable dental hx.  Bridge upper front 2.:   Pulmonary neg pulmonary ROS,  breath sounds clear to auscultation  Pulmonary exam normal       Cardiovascular hypertension, Pt. on medications and Pt. on home beta blockers + Peripheral Vascular Disease and DVT + dysrhythmias Atrial Fibrillation Rhythm:Irregular Rate:Normal     Neuro/Psych Early parkinson's. Clearance Dr. Anne HahnWillis  Neuromuscular disease negative neurological ROS  negative psych ROS   GI/Hepatic negative GI ROS, Neg liver ROS, hiatal hernia,   Endo/Other  negative endocrine ROS  Renal/GU negative Renal ROS  negative genitourinary   Musculoskeletal  (+) Arthritis -, Osteoarthritis,    Abdominal (+) + obese,   Peds negative pediatric ROS (+)  Hematology negative hematology ROS (+)   Anesthesia Other Findings   Reproductive/Obstetrics negative OB ROS                           Anesthesia Physical Anesthesia Plan  ASA: III  Anesthesia Plan: General   Post-op Pain Management:    Induction: Intravenous  Airway Management Planned: Oral ETT  Additional Equipment:   Intra-op Plan:   Post-operative Plan: Extubation in OR  Informed Consent: I have reviewed the patients History and Physical, chart, labs and discussed the procedure including the risks, benefits and alternatives for the proposed anesthesia with the patient or authorized representative who has indicated his/her understanding and acceptance.   Dental advisory given  Plan Discussed with: CRNA  Anesthesia Plan Comments:         Anesthesia Quick Evaluation

## 2014-01-13 NOTE — Progress Notes (Signed)
Time 2210

## 2014-01-13 NOTE — Transfer of Care (Signed)
Immediate Anesthesia Transfer of Care Note  Patient: Angel AlasMary F Greene  Procedure(s) Performed: Procedure(s): ARTHROPLASTY BIPOLAR HIP (Left)  Patient Location: PACU  Anesthesia Type:General  Level of Consciousness: awake and confused  Airway & Oxygen Therapy: Patient Spontanous Breathing and Patient connected to face mask oxygen  Post-op Assessment: Report given to PACU RN and Post -op Vital signs reviewed and stable  Post vital signs: Reviewed and stable  Complications: No apparent anesthesia complications

## 2014-01-13 NOTE — Brief Op Note (Signed)
01/12/2014 - 01/13/2014  3:44 PM  PATIENT:  Angel Greene  80 y.o. female  PRE-OPERATIVE DIAGNOSIS:  left hip fracture, displaced femoral neck fracture  POST-OPERATIVE DIAGNOSIS:  left hip fracture, displaced femoral neck fracture  PROCEDURE:  Procedure(s): ARTHROPLASTY BIPOLAR HIP (Left), DePuy Tri Loc  SURGEON:  Surgeon(s) and Role:    * Verlee RossettiSteven R Jillaine Waren, MD - Primary  PHYSICIAN ASSISTANT:   ASSISTANTS: Thea Gisthomas B Dixon, PA-C   ANESTHESIA:   general  EBL:  Total I/O In: 1160 [P.O.:160; I.V.:1000] Out: 375 [Urine:225; Blood:150]  BLOOD ADMINISTERED:none  DRAINS: none   LOCAL MEDICATIONS USED:  NONE  SPECIMEN:  No Specimen  DISPOSITION OF SPECIMEN:  N/A  COUNTS:  YES  TOURNIQUET:  * No tourniquets in log *  DICTATION: .Other Dictation: Dictation Number [id number]  PLAN OF CARE: Admit to inpatient   PATIENT DISPOSITION:  PACU - hemodynamically stable.   Delay start of Pharmacological VTE agent (>24hrs) due to surgical blood loss or risk of bleeding: no

## 2014-01-13 NOTE — Progress Notes (Signed)
[facility] PROGRESS NOTE  Angel Greene FIE:[id number] DOB: 05/10/1930 DOA: 01/12/2014 PCP: Gwen PoundsUSSO,JOHN M, MD  Assessment/Plan  Closed left subcapital hip fracture -  Appreciate orthopedics assistance -  Pain medication and DVT prophylaxis per surgery (anticipate resumption of coumadin) -  NPO and non-weight bearing prior to procedure  Chronic coumadin therapy for A.Fib PE and DVT -  INR < 1.7 -  Plan to resume coumadin post-surgery  Cipro 250mg  BID listed as home med - unclear how long she has been on therapy or how long she is scheduled to be on therapy at this time. UA is negative at this time so will hold off on this medication for now.  Additionally, there is a drug-drug interaction with tikosyn, but fortunately, her QTc is wnl.    A.Fib, rate controlled -  continue Tykosin and beta blocker -  A/c as above  HTN, BP low normal  - place hold parameter on beta blocker - continue holding diuretics and potassium  Parkinson's, stable -  Continue sinemet  Diet:  NPO Access:  PIV IVF:  off Proph:  SCDs   Code Status: full Family Communication: spoke with patient alone Disposition Plan: to OR later today   Consultants:  Dr. Ranell PatrickNorris  Procedures:  XR left hip  CXR  CT head   Antibiotics:  None  HPI/Subjective:  Pain controlled with pain medication and by lying still.  Denies nausea.    Objective: Filed Vitals:   01/13/14 0305 01/13/14 0330 01/13/14 0400 01/13/14 0640  BP: 117/68   123/73  Pulse: 86   83  Temp: 98 F (36.7 C)   98.6 F (37 C)  TempSrc: Oral   Oral  Resp: 20  18 16   Weight:  65 kg (143 lb 4.8 oz)    SpO2: 96%  84% 99%    Intake/Output Summary (Last 24 hours) at 01/13/14 1056 Last data filed at 01/13/14 0836  Gross per 24 hour  Intake    220 ml  Output    250 ml  Net    -30 ml   Filed Weights   01/13/14 0330  Weight: 65 kg (143 lb 4.8 oz)    Exam:   General:  Thin CF, No acute distress  HEENT:  NCAT,  MMM  Cardiovascular:  IRRR, nl S1, S2 no mrg, 2+ pulses, warm extremities  Respiratory:  CTAB anteriorly, no increased WOB  Abdomen:   NABS, soft, NT/ND  MSK:   Normal tone and bulk, trace bilateral ankle edema, 2+ pedal pulses, able to move toes on both feet, < 2 sec CR, TTP along superior left lateral thigh  Neuro:  Grossly intact  Psych:  Slow to answer, but A&Ox4  Data Reviewed: Basic Metabolic Panel:  Recent Labs Lab 01/12/14 2300  NA 139  K 4.5  CL 95*  CO2 29  GLUCOSE 116*  BUN 33*  CREATININE 1.03  CALCIUM 10.5   Liver Function Tests: No results found for this basename: AST, ALT, ALKPHOS, BILITOT, PROT, ALBUMIN,  in the last 168 hours No results found for this basename: LIPASE, AMYLASE,  in the last 168 hours No results found for this basename: AMMONIA,  in the last 168 hours CBC:  Recent Labs Lab 01/12/14 2300  WBC 8.9  NEUTROABS 7.0  HGB 15.3*  HCT 45.8  MCV 95.2  PLT 223   Cardiac Enzymes: No results found for this basename: CKTOTAL, CKMB, CKMBINDEX, TROPONINI,  in the last 168 hours BNP (last 3 results) No  results found for this basename: PROBNP,  in the last 8760 hours CBG: No results found for this basename: GLUCAP,  in the last 168 hours  Recent Results (from the past 240 hour(s))  SURGICAL PCR SCREEN     Status: None   Collection Time    01/13/14  4:12 AM      Result Value Ref Range Status   MRSA, PCR NEGATIVE  NEGATIVE Final   Staphylococcus aureus NEGATIVE  NEGATIVE Final   Comment:            The Xpert SA Assay (FDA     approved for NASAL specimens     in patients over 60 years of age),     is one component of     a comprehensive surveillance     program.  Test performance has     been validated by [employer] for patients greater     than or equal to 66 year old.     It is not intended     to diagnose infection nor to     guide or monitor treatment.     Studies: Dg Hip Complete Left  01/12/2014   CLINICAL DATA:  Left  hip pain. Patient fell at 9 a.m. with difficulty walking stents. Left anterior hip pain.  EXAM: LEFT HIP - COMPLETE 2+ VIEW  COMPARISON:  None.  FINDINGS: Transverse subcapital fracture of the left hip with impaction of fracture fragments and valgus angulation. Degenerative changes in the left hip previous right hip hemiarthroplasty. Postoperative changes in the lower lumbar spine. Visualized pelvis appears intact.  IMPRESSION: Subcapital fracture of the left proximal femur.   Electronically Signed   By: Burman Nieves M.D.   On: 01/12/2014 22:23   Ct Head Wo Contrast  01/13/2014   CLINICAL DATA:  Fall, inability to walk.  On anticoagulant.  EXAM: CT HEAD WITHOUT CONTRAST  TECHNIQUE: Contiguous axial images were obtained from the base of the skull through the vertex without intravenous contrast.  COMPARISON:  07/27/2012  FINDINGS: Patient is angled on the gantry. Right maxillary sinus mucous retention cyst or polyp noted. No acute hemorrhage, infarct, or mass lesion is identified. Mild cortical volume loss with proportional ventricular prominence reidentified. Minimal soft tissue density within the left external auditory canal is compatible with cerumen. No skull fracture. No soft tissue abnormality.  IMPRESSION: No acute intracranial findings.   Electronically Signed   By: Christiana Pellant M.D.   On: 01/13/2014 00:57   Dg Chest Port 1 View  01/12/2014   CLINICAL DATA:  Preop left hip fracture.  EXAM: PORTABLE CHEST - 1 VIEW  COMPARISON:  07/27/2012  FINDINGS: There is elevation of the left hemidiaphragm with infiltration or atelectasis in the left lung base. Emphysematous changes in the lungs. Normal heart size and pulmonary vascularity. Probable fibrosis in the apices. Calcified and tortuous aorta. Postoperative changes in the left axilla.  IMPRESSION: Atelectasis or infiltration in the left lung base. Underlying emphysematous changes.   Electronically Signed   By: Burman Nieves M.D.   On: 01/12/2014  23:37    Scheduled Meds: . carbidopa-levodopa  1 tablet Oral Q24H  . carbidopa-levodopa  2 tablet Oral BID  . dofetilide  250 mcg Oral BID  . fentaNYL  12.5 mcg Transdermal Q72H  . metoprolol tartrate  25 mg Oral BID   Continuous Infusions: . sodium chloride Stopped (01/13/14 0026)    Principal Problem:   Closed left hip fracture Active  Problems:   Atrial fibrillation   History of DVT (deep vein thrombosis)   History of pulmonary embolus   Hypertension    Time spent: 30 min    Nil Xiong, [facility]  [facility] Pager [phone]. If 7PM-7AM, please contact night-coverage at [email or web address], password [facility] 01/13/2014, 10:56 AM  LOS: 1 day

## 2014-01-13 NOTE — ED Provider Notes (Signed)
CSN: [id number]     Arrival date & time 01/12/14  2118 History   First MD Initiated Contact with Patient 01/12/14 2133     Chief Complaint  Patient presents with  . Hip Pain    Left hip pain. Patient fell backwards at 9am and has not been able to walk since then. Patient has had left hip replacement. Patient needs left knee replacement.   HPI Comments: Patient is an 80 y.o. Female who presents to the ED with left groin pain.  Patient states that she was getting ice out of the freezer and lost her balance.  She fell backwards and landed on her bottom and then rolled back and hit her head.  Patient states that she immediately had some groin pain.  She did hit her head but did not have any loss of consciousness at that time.  Patient states that her husband helped her off the floor but she was having difficulty ambulating or bearing weight.  She states that her pain is a 7/10 at rest and is excruciating with movement.  Her pain does not radiate.  She has found no relieving factors at this time.  Patient does have a right total hip and a partial right knee which were performed by Dr. Lequita Halt from [facility].  She states that she is on coumadin therapy for blood clots in her legs and her lungs, atrial fibrillation.  Her only other significant medical history includes parkinsons disease.   Patient is a 80 y.o. female presenting with hip pain. The history is provided by the patient. No language interpreter was used.  Hip Pain Associated symptoms include arthralgias and joint swelling. Pertinent negatives include no chest pain, chills, fatigue, fever, headaches, numbness or weakness.    Past Medical History  Diagnosis Date  . Atrial fibrillation   . Hypertension     hyperlipidemia-  TEE,CARDIOLYTE STRESS 11/12 with LOV  DR TILLEY AND EKG ON CHART  . Peripheral vascular disease     Hx   DVT x 2  2000  . H/O hiatal hernia   . Bladder infection     06/10/11 with treatment by Dr Timothy Lasso  .  Arthritis   . History of breast cancer     2000- breast cancer followed by 6 months of chemo, radiation and  PO meds  . History of DVT (deep vein thrombosis) 06/24/2002  . Parkinsonism    Past Surgical History  Procedure Laterality Date  . Breast surgery      LUMPECTOMY  WITH AXILLIARY DISSECTION   2000  . Back surgery      2006 - X-Stop spacers placedL3-L4, L4-L5; 2008 - laminectomy and fusion, L3-L5; 2011 Laminectomy and extension of fusion L5-S1  . Tonsillectomy    . Cardiac catheterization      bilateral cataract extraction with IOL  . Cholecystectomy    . Joint replacement      RIGHT HIP REPLACEMENT   1996  . Total knee arthroplasty  06/23/2011    Procedure: TOTAL KNEE ARTHROPLASTY;  Surgeon: Loanne Drilling;  Location: [facility];  Service: Orthopedics;  Laterality: Right;  . Breast lumpectomy    . Mastectomy    . Breast lumpectomy     History reviewed. No pertinent family history. History  Substance Use Topics  . Smoking status: Never Smoker   . Smokeless tobacco: Never Used  . Alcohol Use: No   OB History   Grav Para Term Preterm Abortions TAB SAB Ect Mult Living  Review of Systems  Constitutional: Negative for fever, chills and fatigue.  HENT: Negative for trouble swallowing.   Eyes: Negative for visual disturbance.  Respiratory: Negative for chest tightness and shortness of breath.   Cardiovascular: Negative for chest pain and palpitations.  Musculoskeletal: Positive for arthralgias, gait problem and joint swelling. Negative for back pain.  Neurological: Negative for dizziness, syncope, weakness, light-headedness, numbness and headaches.  All other systems reviewed and are negative.     Allergies  Review of patient's allergies indicates no known allergies.  Home Medications   Prior to Admission medications   Medication Sig Start Date End Date Taking? Authorizing Provider  Calcium Carbonate-Vitamin D (CALTRATE 600+D) 600-400 MG-UNIT per chew  tablet Chew 2 tablets by mouth daily. 2 tablets in the morning, 1 tablet at noon, and 2 tablets at night.   Yes Historical Provider, MD  carbidopa-levodopa (SINEMET IR) 25-100 MG per tablet Take 1-2 tablets by mouth 3 (three) times daily.   Yes Historical Provider, MD  ciprofloxacin (CIPRO) 250 MG tablet Take 250 mg by mouth 2 (two) times daily.   Yes Historical Provider, MD  dofetilide (TIKOSYN) 250 MCG capsule Take 250 mcg by mouth 2 (two) times daily.   Yes Historical Provider, MD  fentaNYL (DURAGESIC - DOSED MCG/HR) 12 MCG/HR Place 1 patch onto the skin every 3 (three) days.   Yes Historical Provider, MD  furosemide (LASIX) 40 MG tablet Take 20 mg by mouth as needed for fluid.    Yes Historical Provider, MD  lidocaine (LIDODERM) 5 % Place 1 patch onto the skin daily. Remove & Discard patch within 12 hours or as directed by MD   Yes Historical Provider, MD  metoprolol tartrate (LOPRESSOR) 25 MG tablet Take 1 tablet (25 mg total) by mouth 2 (two) times daily. 12/17/11 01/25/16 Yes Othella Boyer, MD  omega-3 acid ethyl esters (LOVAZA) 1 G capsule Take 1 g by mouth daily. 10/18/11 01/25/16 Yes [name], MD  potassium chloride SA (K-DUR,KLOR-CON) 20 MEQ tablet Take 1 tablet (20 mEq total) by mouth daily. 12/17/11 01/13/15 Yes Othella Boyer, MD  spironolactone (ALDACTONE) 25 MG tablet Take 25 mg by mouth 2 (two) times daily.  07/16/12  Yes Historical Provider, MD  traMADol (ULTRAM) 50 MG tablet Take 50 mg by mouth every 6 (six) hours as needed. Maximum dose= 8 tablets per day. PAIN    Yes Historical Provider, MD  warfarin (COUMADIN) 5 MG tablet Take 5 mg by mouth daily.   Yes Historical Provider, MD   BP 118/59  Pulse 80  Temp(Src) 98.6 F (37 C) (Oral)  Resp 16  SpO2 91% Physical Exam  Nursing note and vitals reviewed. Constitutional: She is oriented to person, place, and time. She appears well-developed and well-nourished. No distress.  HENT:  Head: Normocephalic and atraumatic.   Mouth/Throat: Oropharynx is clear and moist. No oropharyngeal exudate.  Eyes: Conjunctivae and EOM are normal. Pupils are equal, round, and reactive to light. No scleral icterus.  Neck: Normal range of motion. Neck supple. No JVD present. No thyromegaly present.  Cardiovascular: Normal rate, regular rhythm, normal heart sounds and intact distal pulses.  Exam reveals no gallop and no friction rub.   No murmur heard. Pulmonary/Chest: Effort normal and breath sounds normal. No respiratory distress. She has no wheezes. She has no rales. She exhibits no tenderness.  Abdominal: Soft. Bowel sounds are normal. She exhibits no distension and no mass. There is no tenderness. There is no rebound and no guarding.  Musculoskeletal:  Right hip is without erythema, tenderness, warmth, or effusion.  There is no tenderness to palpation.  There is full active pain free ROM.  No irritability with internal or external rotation.  Left hip is without erythema, warmth, or ecchymosis.  There is tenderness to palpation of the groin and greater trochanter.  ROM is limited due pain.  There is extreme irritability with internal rotation of the hip.    Sensation is intact over bilateral lower extremities, there are 1+ pedal pulses bilaterally.    Lymphadenopathy:    She has no cervical adenopathy.  Neurological: She is alert and oriented to person, place, and time. No cranial nerve deficit or sensory deficit. Coordination normal.  Skin: Skin is warm and dry. She is not diaphoretic.    ED Course  Procedures (including critical care time) Labs Review Labs Reviewed  PROTIME-INR - Abnormal; Notable for the following:    Prothrombin Time 28.5 (*)    INR 2.68 (*)    All other components within normal limits  URINALYSIS, ROUTINE W REFLEX MICROSCOPIC - Abnormal; Notable for the following:    APPearance CLOUDY (*)    Leukocytes, UA SMALL (*)    All other components within normal limits  CBC WITH DIFFERENTIAL - Abnormal;  Notable for the following:    Hemoglobin 15.3 (*)    Neutrophils Relative % 78 (*)    All other components within normal limits  BASIC METABOLIC PANEL - Abnormal; Notable for the following:    Chloride 95 (*)    Glucose, Bld 116 (*)    BUN 33 (*)    GFR calc non Af Amer 49 (*)    GFR calc Af Amer 57 (*)    All other components within normal limits  URINE MICROSCOPIC-ADD ON - Abnormal; Notable for the following:    Squamous Epithelial / LPF MANY (*)    Bacteria, UA FEW (*)    All other components within normal limits  PROTIME-INR  TYPE AND SCREEN    Imaging Review Dg Hip Complete Left  01/12/2014   CLINICAL DATA:  Left hip pain. Patient fell at 9 a.m. with difficulty walking stents. Left anterior hip pain.  EXAM: LEFT HIP - COMPLETE 2+ VIEW  COMPARISON:  None.  FINDINGS: Transverse subcapital fracture of the left hip with impaction of fracture fragments and valgus angulation. Degenerative changes in the left hip previous right hip hemiarthroplasty. Postoperative changes in the lower lumbar spine. Visualized pelvis appears intact.  IMPRESSION: Subcapital fracture of the left proximal femur.   Electronically Signed   By: Burman Nieves M.D.   On: 01/12/2014 22:23   Dg Chest Port 1 View  01/12/2014   CLINICAL DATA:  Preop left hip fracture.  EXAM: PORTABLE CHEST - 1 VIEW  COMPARISON:  07/27/2012  FINDINGS: There is elevation of the left hemidiaphragm with infiltration or atelectasis in the left lung base. Emphysematous changes in the lungs. Normal heart size and pulmonary vascularity. Probable fibrosis in the apices. Calcified and tortuous aorta. Postoperative changes in the left axilla.  IMPRESSION: Atelectasis or infiltration in the left lung base. Underlying emphysematous changes.   Electronically Signed   By: Burman Nieves M.D.   On: 01/12/2014 23:37     EKG Interpretation   Date/Time:  Saturday January 12 2014 22:49:09 EDT Ventricular Rate:  101 PR Interval:  195 QRS Duration:  138 QT Interval:  356 QTC Calculation: 461 R Axis:   38 Text Interpretation:  Sinus tachycardia Multiform ventricular premature  complexes Right bundle branch block Baseline wander in lead(s) I aVL  Confirmed by Rubin PayorPICKERING  MD, Harrold DonathNATHAN [phone]([id number]) on 01/12/2014 11:02:54 PM      MDM   Final diagnoses:  Closed left hip fracture, initial encounter   Patient is an 80 y.o. Who [facility] with [facility].  Plain film xray shows left subcapital fracture.  Patient is on a blood thinner and is over the age of 89 so I have ordered a head CT which is pending to rule out any intracranial bleed.  I have consulted with Dr. Ranell PatrickNorris from [facility] at this time and he states that he would like to have the patient admitted to the hospitalist service at this time.  He will operate on her as soon as her coumadin therapy has been reversed.  I have spoken with Dr. Julian ReilGardner from [facility] who has seen the patient here in the ED.  He will admit her at this time.        Eben Burowourtney A Forcucci, PA-C 01/13/14 [phone]

## 2014-01-13 NOTE — Progress Notes (Signed)
   Subjective:     left femur fracture s/p fall last night Pt resting comfortably in no acute distress INR 1.58 currently  Will plan for surgery later today Keep npo   Patient reports pain as mild.  Objective:   VITALS:   Filed Vitals:   01/13/14 0640  BP: 123/73  Pulse: 83  Temp: 98.6 F (37 C)  Resp: 16    Left lower extremity with minimal shortening  nv intact distally No rashes  LABS  Recent Labs  01/12/14 2300  HGB 15.3*  HCT 45.8  WBC 8.9  PLT 223     Recent Labs  01/12/14 2300  NA 139  K 4.5  BUN 33*  CREATININE 1.03  GLUCOSE 116*     Assessment/Plan:    Left femur fracture Plan for surgery later today Keep NPO Pain control as needed      Alphonsa OverallBrad Janice Bodine, MPAS, PA-C  01/13/2014, 7:13 AM

## 2014-01-13 NOTE — Anesthesia Postprocedure Evaluation (Signed)
Anesthesia Post Note  Patient: Angel Greene  Procedure(s) Performed: Procedure(s) (LRB): ARTHROPLASTY BIPOLAR HIP (Left)  Anesthesia type: General  Patient location: PACU  Post pain: Pain level controlled  Post assessment: Post-op Vital signs reviewed  Last Vitals:  Filed Vitals:   01/13/14 1715  BP: 113/64  Pulse: 96  Temp: 36.9 C  Resp: 18    Post vital signs: Reviewed  Level of consciousness: sedated  Complications: No apparent anesthesia complications

## 2014-01-13 NOTE — Consult Note (Signed)
Reason for Consult:Broken Left Hip Referring Physician: Zionah Criswell is an 80 y.o. female.  HPI: 80 yo female s/p ground level fall this PM in her kitchen.  Patient denies LOC or chest pain.  Complains of left hip pain and inability to stand on the left leg.  Past Medical History  Diagnosis Date  . Atrial fibrillation   . Hypertension     hyperlipidemia-  TEE,CARDIOLYTE STRESS 11/12 with LOV  DR TILLEY AND EKG ON CHART  . Peripheral vascular disease     Hx   DVT x 2  2000  . H/O hiatal hernia   . Bladder infection     06/10/11 with treatment by Dr Virgina Jock  . Arthritis   . History of breast cancer     2000- breast cancer followed by 6 months of chemo, radiation and  PO meds  . History of DVT (deep vein thrombosis) 06/24/2002  . Parkinsonism     Past Surgical History  Procedure Laterality Date  . Breast surgery      LUMPECTOMY  WITH AXILLIARY DISSECTION   2000  . Back surgery      2006 - X-Stop spacers placedL3-L4, L4-L5; 2008 - laminectomy and fusion, L3-L5; 2011 Laminectomy and extension of fusion L5-S1  . Tonsillectomy    . Cardiac catheterization      bilateral cataract extraction with IOL  . Cholecystectomy    . Joint replacement      RIGHT HIP REPLACEMENT   1996  . Total knee arthroplasty  06/23/2011    Procedure: TOTAL KNEE ARTHROPLASTY;  Surgeon: Gearlean Alf;  Location: [facility];  Service: Orthopedics;  Laterality: Right;  . Breast lumpectomy    . Mastectomy    . Breast lumpectomy      History reviewed. No pertinent family history.  Social History:  reports that she has never smoked. She has never used smokeless tobacco. She reports that she does not drink alcohol or use illicit drugs.  Allergies: No Known Allergies  Medications: I have reviewed the patient's current medications.  Results for orders placed during the hospital encounter of 01/12/14 (from the past 48 hour(s))  PROTIME-INR     Status: Abnormal   Collection Time    01/12/14 11:00 PM       Result Value Ref Range   Prothrombin Time 28.5 (*) 11.6 - 15.2 seconds   INR 2.68 (*) 0.00 - 1.49  CBC WITH DIFFERENTIAL     Status: Abnormal   Collection Time    01/12/14 11:00 PM      Result Value Ref Range   WBC 8.9  4.0 - 10.5 K/uL   RBC 4.81  3.87 - 5.11 MIL/uL   Hemoglobin 15.3 (*) 12.0 - 15.0 g/dL   HCT 45.8  36.0 - 46.0 %   MCV 95.2  78.0 - 100.0 fL   MCH 31.8  26.0 - 34.0 pg   MCHC 33.4  30.0 - 36.0 g/dL   RDW 14.8  11.5 - 15.5 %   Platelets 223  150 - 400 K/uL   Neutrophils Relative % 78 (*) 43 - 77 %   Neutro Abs 7.0  1.7 - 7.7 K/uL   Lymphocytes Relative 15  12 - 46 %   Lymphs Abs 1.3  0.7 - 4.0 K/uL   Monocytes Relative 6  3 - 12 %   Monocytes Absolute 0.5  0.1 - 1.0 K/uL   Eosinophils Relative 1  0 - 5 %   Eosinophils  Absolute 0.1  0.0 - 0.7 K/uL   Basophils Relative 0  0 - 1 %   Basophils Absolute 0.0  0.0 - 0.1 K/uL  BASIC METABOLIC PANEL     Status: Abnormal   Collection Time    01/12/14 11:00 PM      Result Value Ref Range   Sodium 139  137 - 147 mEq/L   Potassium 4.5  3.7 - 5.3 mEq/L   Chloride 95 (*) 96 - 112 mEq/L   CO2 29  19 - 32 mEq/L   Glucose, Bld 116 (*) 70 - 99 mg/dL   BUN 33 (*) 6 - 23 mg/dL   Creatinine, Ser 1.03  0.50 - 1.10 mg/dL   Calcium 10.5  8.4 - 10.5 mg/dL   GFR calc non Af Amer 49 (*) >90 mL/min   GFR calc Af Amer 57 (*) >90 mL/min   Comment: (NOTE)     The eGFR has been calculated using the CKD EPI equation.     This calculation has not been validated in all clinical situations.     eGFR's persistently <90 mL/min signify possible Chronic Kidney     Disease.   Anion gap 15  5 - 15  URINALYSIS, ROUTINE W REFLEX MICROSCOPIC     Status: Abnormal   Collection Time    01/12/14 11:01 PM      Result Value Ref Range   Color, Urine YELLOW  YELLOW   APPearance CLOUDY (*) CLEAR   Specific Gravity, Urine 1.019  1.005 - 1.030   pH 5.0  5.0 - 8.0   Glucose, UA NEGATIVE  NEGATIVE mg/dL   Hgb urine dipstick NEGATIVE  NEGATIVE    Bilirubin Urine NEGATIVE  NEGATIVE   Ketones, ur NEGATIVE  NEGATIVE mg/dL   Protein, ur NEGATIVE  NEGATIVE mg/dL   Urobilinogen, UA 0.2  0.0 - 1.0 mg/dL   Nitrite NEGATIVE  NEGATIVE   Leukocytes, UA SMALL (*) NEGATIVE  URINE MICROSCOPIC-ADD ON     Status: Abnormal   Collection Time    01/12/14 11:01 PM      Result Value Ref Range   Squamous Epithelial / LPF MANY (*) RARE   WBC, UA 0-2  <3 WBC/hpf   Bacteria, UA FEW (*) RARE    Dg Hip Complete Left  01/12/2014   CLINICAL DATA:  Left hip pain. Patient fell at 9 a.m. with difficulty walking stents. Left anterior hip pain.  EXAM: LEFT HIP - COMPLETE 2+ VIEW  COMPARISON:  None.  FINDINGS: Transverse subcapital fracture of the left hip with impaction of fracture fragments and valgus angulation. Degenerative changes in the left hip previous right hip hemiarthroplasty. Postoperative changes in the lower lumbar spine. Visualized pelvis appears intact.  IMPRESSION: Subcapital fracture of the left proximal femur.   Electronically Signed   By: Lucienne Capers M.D.   On: 01/12/2014 22:23   Dg Chest Port 1 View  01/12/2014   CLINICAL DATA:  Preop left hip fracture.  EXAM: PORTABLE CHEST - 1 VIEW  COMPARISON:  07/27/2012  FINDINGS: There is elevation of the left hemidiaphragm with infiltration or atelectasis in the left lung base. Emphysematous changes in the lungs. Normal heart size and pulmonary vascularity. Probable fibrosis in the apices. Calcified and tortuous aorta. Postoperative changes in the left axilla.  IMPRESSION: Atelectasis or infiltration in the left lung base. Underlying emphysematous changes.   Electronically Signed   By: Lucienne Capers M.D.   On: 01/12/2014 23:37    ROS Blood pressure  118/59, pulse 80, temperature 98.6 F (37 C), temperature source Oral, resp. rate 16, SpO2 91.00%. Physical Exam  Healthy appearing elderly female in NAD laying on the ED stretcher. Neck and back nontender, bilateral UEs with normal ROM and no pain and  normal strength. Abdomen soft, right leg with normal ROM and no pain.  Able to move her left ankle and foot but not the knee or hip without pain.    Assessment/Plan: Displaced femoral neck fracture of the left hip. Patient will need hemi-arthroplasty once her Coumadin has been reversed with Vitamin K.  Will need INR below 1.7 for surgery. NPO after breakfast.  Charyl Minervini,STEVEN R 01/13/2014, 12:08 AM

## 2014-01-13 NOTE — H&P (Signed)
[facility] History and Physical  SHARONNE RICKETTS IHK:[id number] DOB: [birth year] DOA: 01/12/2014  Referring physician: EDP PCP: [name], MD   Chief Complaint: Fall   HPI: Angel Greene is a 80 y.o. female who suffered a mechanical fall while turning away from her fridge at home.  No LOC.  After fall she had L hip pain and inability to ambulate so was brought in to the ED.  Review of Systems: Systems reviewed.  As above, otherwise negative  Past Medical History  Diagnosis Date  . Atrial fibrillation   . Hypertension     hyperlipidemia-  TEE,CARDIOLYTE STRESS 11/12 with LOV  DR TILLEY AND EKG ON CHART  . Peripheral vascular disease     Hx   DVT x 2  2000  . H/O hiatal hernia   . Bladder infection     06/10/11 with treatment by Dr Timothy Lasso  . Arthritis   . History of breast cancer     2000- breast cancer followed by 6 months of chemo, radiation and  PO meds  . History of DVT (deep vein thrombosis) 06/24/2002  . Parkinsonism    Past Surgical History  Procedure Laterality Date  . Breast surgery      LUMPECTOMY  WITH AXILLIARY DISSECTION   2000  . Back surgery      2006 - X-Stop spacers placedL3-L4, L4-L5; 2008 - laminectomy and fusion, L3-L5; 2011 Laminectomy and extension of fusion L5-S1  . Tonsillectomy    . Cardiac catheterization      bilateral cataract extraction with IOL  . Cholecystectomy    . Joint replacement      RIGHT HIP REPLACEMENT   1996  . Total knee arthroplasty  06/23/2011    Procedure: TOTAL KNEE ARTHROPLASTY;  Surgeon: Loanne Drilling;  Location: [facility];  Service: Orthopedics;  Laterality: Right;  . Breast lumpectomy    . Mastectomy    . Breast lumpectomy     Social History:  reports that she has never smoked. She has never used smokeless tobacco. She reports that she does not drink alcohol or use illicit drugs.  No Known Allergies  History reviewed. No pertinent family history.   Prior to Admission medications   Medication Sig Start Date End  Date Taking? Authorizing Provider  Calcium Carbonate-Vitamin D (CALTRATE 600+D) 600-400 MG-UNIT per chew tablet Chew 2 tablets by mouth daily. 2 tablets in the morning, 1 tablet at noon, and 2 tablets at night.   Yes Historical Provider, MD  carbidopa-levodopa (SINEMET IR) 25-100 MG per tablet Take 1-2 tablets by mouth 3 (three) times daily.   Yes Historical Provider, MD  ciprofloxacin (CIPRO) 250 MG tablet Take 250 mg by mouth 2 (two) times daily.   Yes Historical Provider, MD  dofetilide (TIKOSYN) 250 MCG capsule Take 250 mcg by mouth 2 (two) times daily.   Yes Historical Provider, MD  fentaNYL (DURAGESIC - DOSED MCG/HR) 12 MCG/HR Place 1 patch onto the skin every 3 (three) days.   Yes Historical Provider, MD  furosemide (LASIX) 40 MG tablet Take 20 mg by mouth as needed for fluid.    Yes Historical Provider, MD  lidocaine (LIDODERM) 5 % Place 1 patch onto the skin daily. Remove & Discard patch within 12 hours or as directed by MD   Yes Historical Provider, MD  metoprolol tartrate (LOPRESSOR) 25 MG tablet Take 1 tablet (25 mg total) by mouth 2 (two) times daily. 12/17/11 01/25/16 Yes Othella Boyer, MD  omega-3 acid ethyl  esters (LOVAZA) 1 G capsule Take 1 g by mouth daily. 10/18/11 01/25/16 Yes [name], MD  potassium chloride SA (K-DUR,KLOR-CON) 20 MEQ tablet Take 1 tablet (20 mEq total) by mouth daily. 12/17/11 01/13/15 Yes Othella Boyer, MD  spironolactone (ALDACTONE) 25 MG tablet Take 25 mg by mouth 2 (two) times daily.  07/16/12  Yes Historical Provider, MD  traMADol (ULTRAM) 50 MG tablet Take 50 mg by mouth every 6 (six) hours as needed. Maximum dose= 8 tablets per day. PAIN    Yes Historical Provider, MD  warfarin (COUMADIN) 5 MG tablet Take 5 mg by mouth daily.   Yes Historical Provider, MD   Physical Exam: Filed Vitals:   01/12/14 2329  BP: 118/59  Pulse: 80  Temp:   Resp: 16    BP 118/59  Pulse 80  Temp(Src) 98.6 F (37 C) (Oral)  Resp 16  SpO2 91%  General Appearance:     Alert, oriented, slight difficulty with memory (eg trouble remembering why she is on coumadin etc), no distress, appears stated age  Head:    Normocephalic, atraumatic  Eyes:    PERRL, EOMI, sclera non-icteric        Nose:   Nares without drainage or epistaxis. Mucosa, turbinates normal  Throat:   Moist mucous membranes. Oropharynx without erythema or exudate.  Neck:   Supple. No carotid bruits.  No thyromegaly.  No lymphadenopathy.   Back:     No CVA tenderness, no spinal tenderness  Lungs:     Clear to auscultation bilaterally, without wheezes, rhonchi or rales  Chest wall:    No tenderness to palpitation  Heart:    Regular rate and rhythm without murmurs, gallops, rubs  Abdomen:     Soft, non-tender, nondistended, normal bowel sounds, no organomegaly  Genitalia:    deferred  Rectal:    deferred  Extremities:   L hip TTP, painful ROM.  Pulses:   2+ and symmetric all extremities  Skin:   Skin color, texture, turgor normal, no rashes or lesions  Lymph nodes:   Cervical, supraclavicular, and axillary nodes normal  Neurologic:   CNII-XII intact. Normal strength, sensation and reflexes      throughout    Labs on Admission:  Basic Metabolic Panel:  Recent Labs Lab 01/12/14 2300  NA 139  K 4.5  CL 95*  CO2 29  GLUCOSE 116*  BUN 33*  CREATININE 1.03  CALCIUM 10.5   Liver Function Tests: No results found for this basename: AST, ALT, ALKPHOS, BILITOT, PROT, ALBUMIN,  in the last 168 hours No results found for this basename: LIPASE, AMYLASE,  in the last 168 hours No results found for this basename: AMMONIA,  in the last 168 hours CBC:  Recent Labs Lab 01/12/14 2300  WBC 8.9  NEUTROABS 7.0  HGB 15.3*  HCT 45.8  MCV 95.2  PLT 223   Cardiac Enzymes: No results found for this basename: CKTOTAL, CKMB, CKMBINDEX, TROPONINI,  in the last 168 hours  BNP (last 3 results) No results found for this basename: PROBNP,  in the last 8760 hours CBG: No results found for this  basename: GLUCAP,  in the last 168 hours  Radiological Exams on Admission: Dg Hip Complete Left  01/12/2014   CLINICAL DATA:  Left hip pain. Patient fell at 9 a.m. with difficulty walking stents. Left anterior hip pain.  EXAM: LEFT HIP - COMPLETE 2+ VIEW  COMPARISON:  None.  FINDINGS: Transverse subcapital fracture of the left hip  with impaction of fracture fragments and valgus angulation. Degenerative changes in the left hip previous right hip hemiarthroplasty. Postoperative changes in the lower lumbar spine. Visualized pelvis appears intact.  IMPRESSION: Subcapital fracture of the left proximal femur.   Electronically Signed   By: Burman NievesWilliam  Stevens M.D.   On: 01/12/2014 22:23   Ct Head Wo Contrast  01/13/2014   CLINICAL DATA:  Fall, inability to walk.  On anticoagulant.  EXAM: CT HEAD WITHOUT CONTRAST  TECHNIQUE: Contiguous axial images were obtained from the base of the skull through the vertex without intravenous contrast.  COMPARISON:  07/27/2012  FINDINGS: Patient is angled on the gantry. Right maxillary sinus mucous retention cyst or polyp noted. No acute hemorrhage, infarct, or mass lesion is identified. Mild cortical volume loss with proportional ventricular prominence reidentified. Minimal soft tissue density within the left external auditory canal is compatible with cerumen. No skull fracture. No soft tissue abnormality.  IMPRESSION: No acute intracranial findings.   Electronically Signed   By: Christiana PellantGretchen  Green M.D.   On: 01/13/2014 00:57   Dg Chest Port 1 View  01/12/2014   CLINICAL DATA:  Preop left hip fracture.  EXAM: PORTABLE CHEST - 1 VIEW  COMPARISON:  07/27/2012  FINDINGS: There is elevation of the left hemidiaphragm with infiltration or atelectasis in the left lung base. Emphysematous changes in the lungs. Normal heart size and pulmonary vascularity. Probable fibrosis in the apices. Calcified and tortuous aorta. Postoperative changes in the left axilla.  IMPRESSION: Atelectasis or  infiltration in the left lung base. Underlying emphysematous changes.   Electronically Signed   By: Burman NievesWilliam  Stevens M.D.   On: 01/12/2014 23:37    EKG: Independently reviewed.  Assessment/Plan Principal Problem:   Closed left hip fracture Active Problems:   Atrial fibrillation   History of DVT (deep vein thrombosis)   History of pulmonary embolus   1. Closed left hip fracture - orthopaedic surgery will see patient in AM, their note is in chart.  Hip fracture pathway ordered, patient NPO after breakfast tomorrow per ortho instructions (1000 am).  SCDs only ordered for DVT ppx at this time (theraputic INR at this time). 2. Chronic coumadin therapy for A.Fib PE and DVT - reversing coumadin for hip surgery, repeat INR ordered for morning, goal < 1.7 for surgery.  10mg  vit K ordered. 3. Cipro 250mg  BID listed as home med - unclear how long she has been on therapy or how long she is scheduled to be on therapy at this time.  UA is negative at this time so will hold off on this medication for now.  Patient unfortunately unable to tell me why this is in home med rec list. 4. A.Fib - continue Tykosin 5. HTN - continue beta blocker, holding diuretics and potassium    Code Status: Full  Family Communication: No family in room, patients husband is her decision maker and she wants him involved in care. Disposition Plan: Admit to inpatient   Time spent: 70 min  GARDNER, JARED M. [facility] Pager [phone]  If 7AM-7PM, please contact the day team taking care of the patient [email or web address] Password [email or web address] 01/13/2014, 1:06 AM

## 2014-01-14 ENCOUNTER — Encounter (HOSPITAL_COMMUNITY): Payer: Self-pay | Admitting: Orthopedic Surgery

## 2014-01-14 ENCOUNTER — Telehealth: Payer: Self-pay | Admitting: Neurology

## 2014-01-14 DIAGNOSIS — Z7901 Long term (current) use of anticoagulants: Secondary | ICD-10-CM

## 2014-01-14 LAB — CBC
HCT: 35.5 % — ABNORMAL LOW (ref 36.0–46.0)
HEMOGLOBIN: 12 g/dL (ref 12.0–15.0)
MCH: 31.8 pg (ref 26.0–34.0)
MCHC: 33.8 g/dL (ref 30.0–36.0)
MCV: 94.2 fL (ref 78.0–100.0)
Platelets: 167 10*3/uL (ref 150–400)
RBC: 3.77 MIL/uL — AB (ref 3.87–5.11)
RDW: 15 % (ref 11.5–15.5)
WBC: 5.9 10*3/uL (ref 4.0–10.5)

## 2014-01-14 LAB — BASIC METABOLIC PANEL
Anion gap: 10 (ref 5–15)
BUN: 21 mg/dL (ref 6–23)
CHLORIDE: 100 meq/L (ref 96–112)
CO2: 29 mEq/L (ref 19–32)
Calcium: 8.6 mg/dL (ref 8.4–10.5)
Creatinine, Ser: 0.9 mg/dL (ref 0.50–1.10)
GFR, EST AFRICAN AMERICAN: 67 mL/min — AB (ref >90)
GFR, EST NON AFRICAN AMERICAN: 58 mL/min — AB (ref >90)
GLUCOSE: 107 mg/dL — AB (ref 70–99)
POTASSIUM: 4 meq/L (ref 3.7–5.3)
Sodium: 139 mEq/L (ref 137–147)

## 2014-01-14 LAB — PROTIME-INR
INR: 1.24 (ref 0.00–1.49)
Prothrombin Time: 15.6 seconds — ABNORMAL HIGH (ref 11.6–15.2)

## 2014-01-14 LAB — MAGNESIUM: Magnesium: 1.6 mg/dL (ref 1.5–2.5)

## 2014-01-14 MED ORDER — WARFARIN - PHARMACIST DOSING INPATIENT: Freq: Every day | Status: DC

## 2014-01-14 MED ORDER — WARFARIN SODIUM 7.5 MG PO TABS
7.5000 mg | ORAL_TABLET | Freq: Once | ORAL | Status: AC
  Administered 2014-01-14: 7.5 mg via ORAL
  Filled 2014-01-14: qty 1

## 2014-01-14 MED ORDER — ENSURE COMPLETE PO LIQD
237.0000 mL | Freq: Two times a day (BID) | ORAL | Status: DC
  Administered 2014-01-14 – 2014-01-15 (×2): 237 mL via ORAL

## 2014-01-14 MED ORDER — MAGNESIUM OXIDE 400 (241.3 MG) MG PO TABS
400.0000 mg | ORAL_TABLET | Freq: Once | ORAL | Status: AC
  Administered 2014-01-14: 400 mg via ORAL
  Filled 2014-01-14: qty 1

## 2014-01-14 NOTE — Progress Notes (Signed)
Noted pt has fentanyl 50 mcg patch on upper post neck.Pt states her husband placed it on Sat and changes every 3 days for her chronic pain. Fentanyl 15 mcg patch ordered for 10am this morning.Will report findings to Sharrie RothmanKim K oncoming RN to clarify with physician.Breakfast ordered due to physician note that pt would be NPO after breakfast for surgery Sunday 01/13/14. Linward HeadlandBeverly, Deanthony Maull D

## 2014-01-14 NOTE — Progress Notes (Signed)
Clinical Social Work Department BRIEF PSYCHOSOCIAL ASSESSMENT 01/14/2014  Patient:  Angel Greene,Angel Greene     Account Number:  [id number]     Admit date:  01/12/2014  Clinical Social Worker:  Candie ChromanHAIDINGER,Adelai Achey, LCSW  Date/Time:  01/14/2014 11:01 AM  Referred by:  Physician  Date Referred:  01/14/2014 Referred for  SNF Placement   Other Referral:   Interview type:  Patient Other interview type:    PSYCHOSOCIAL DATA Living Status:  HUSBAND Admitted from facility:   Level of care:   Primary support name:  [name] Primary support relationship to patient:  SPOUSE Degree of support available:   supportive    CURRENT CONCERNS Current Concerns  Post-Acute Placement   Other Concerns:    SOCIAL WORK ASSESSMENT / PLAN Pt is an 80 yr old female living at home prior to hospitalization. Pt fell resulting in a hip fx. Pt had surgery on 7/12. ST Rehab will be needed following hospital d/c. Pt has requested placement at [facility] ( [facility] ). CSW has contacted SNF and d/c plan has been confirmed. CSW will continue to follow to assist with d/c planning to SNF.   Assessment/plan status:  Psychosocial Support/Ongoing Assessment of Needs Other assessment/ plan:   Information/referral to community resources:   Insurance coverage for SNF and ambulance transport has been reviewed.    PATIENT'S/FAMILY'S RESPONSE TO PLAN OF CARE: Pt is disappointed she fell and ended up in the hospital. States she is comfortable. Pt feels ST Rehab will be the best plan for her at d/c. " I've been to [facility] before and they took good care of me."     Cori RazorJamie Ruthann Angulo LCSW [phone]

## 2014-01-14 NOTE — Evaluation (Signed)
Physical Therapy Evaluation Patient Details Name: Angel AquasMary F Virginia MRN: [id number] DOB: [birth year] Today's Date: 01/14/2014   History of Present Illness  Pt  admitted 01/12/14 after a fall and fracture d L hip. S/p L hip hemiarthroplasty 01/13/14- posterior approach. Pt has h/o Parkinson's .  Clinical Impression  Pt tolerated mobilizing to edge of abed and standing and taking a few steps to the recliner. Pt will benefit from PT to address problems listed in note below.    Follow Up Recommendations SNF;Supervision/Assistance - 24 hour    Equipment Recommendations  None recommended by PT    Recommendations for Other Services       Precautions / Restrictions Precautions Precautions: Fall;Posterior Hip Precaution Comments: posted precautions Restrictions Weight Bearing Restrictions: No LLE Weight Bearing: Weight bearing as tolerated      Mobility  Bed Mobility Overal bed mobility: Needs Assistance;+2 for physical assistance;+ 2 for safety/equipment Bed Mobility: Supine to Sit     Supine to sit: Mod assist;+2 for physical assistance;+2 for safety/equipment;HOB elevated     General bed mobility comments: cues for hip precautios, assist to get trunk upright, Assist to move LLe to edge  Transfers Overall transfer level: Needs assistance Equipment used: Rolling walker (2 wheeled) Transfers: Sit to/from UGI CorporationStand;Stand Pivot Transfers Sit to Stand: +2 physical assistance;+2 safety/equipment;Mod assist;From elevated surface         General transfer comment: cues for precautions, for use of RW , for WBAT.  Ambulation/Gait Ambulation/Gait assistance: Mod assist;+2 physical assistance;+2 safety/equipment Ambulation Distance (Feet): 5 Feet Assistive device: Rolling walker (2 wheeled) Gait Pattern/deviations: Step-to pattern     General Gait Details: cues for safety  Stairs            Wheelchair Mobility    Modified Rankin (Stroke Patients Only)       Balance                                              Pertinent Vitals/Pain Pt reports pain was minimal, had tylenol    Home Living Family/patient expects to be discharged to:: Skilled nursing facility Living Arrangements: Spouse/significant other                    Prior Function Level of Independence: Independent with assistive device(s)               Hand Dominance        Extremity/Trunk Assessment   Upper Extremity Assessment: Overall WFL for tasks assessed           Lower Extremity Assessment: LLE deficits/detail   LLE Deficits / Details: assistance to move Leg to edge of bed.     Communication      Cognition Arousal/Alertness: Awake/alert Behavior During Therapy: WFL for tasks assessed/performed Overall Cognitive Status: No family/caregiver present to determine baseline cognitive functioning (pt a bit scattered in expaining functional status PTA)                      General Comments      Exercises        Assessment/Plan    PT Assessment Patient needs continued PT services  PT Diagnosis Difficulty walking;Acute pain   PT Problem List Decreased strength;Decreased range of motion;Decreased activity tolerance;Decreased mobility;Decreased knowledge of precautions;Decreased safety awareness;Decreased knowledge of use of DME  PT Treatment Interventions DME instruction;Gait training;Functional  mobility training;Therapeutic activities;Therapeutic exercise;Patient/family education   PT Goals (Current goals can be found in the Care Plan section) Acute Rehab PT Goals Patient Stated Goal: to get up if I can PT Goal Formulation: With patient Time For Goal Achievement: 01/21/14 Potential to Achieve Goals: Good    Frequency Min 3X/week   Barriers to discharge        Co-evaluation               End of Session Equipment Utilized During Treatment: Gait belt Activity Tolerance: Patient tolerated treatment well Patient left: in  chair;with call bell/phone within reach Nurse Communication: Mobility status         Time: 1032-1055 PT Time Calculation (min): 23 min   Charges:   PT Evaluation $Initial PT Evaluation Tier I: 1 Procedure PT Treatments $Therapeutic Activity: 23-37 mins   PT G Codes:          Rada Hay 01/14/2014, 11:09 AM Blanchard Kelch PT [phone]

## 2014-01-14 NOTE — Progress Notes (Signed)
CSW spoke with pt's spouse and met with pt to confirm d/c plan. Pt / spouse are on a waiting list to enter [facility] ( independent living ). Pt / spouse have requested ST Rehab at [facility] following hospital d/c. SNF has been contacted and is able to offer a rehab bed. [facility] has been contacted and updated. CSW will continue to follow to assist with d/c planning needs.  Werner Lean LCSW [phone]

## 2014-01-14 NOTE — Telephone Encounter (Signed)
Message copied by Griffin DakinSANDS, SHERRI R on Mon Jan 14, 2014  5:53 PM ------      Message from: TAT, REBECCA S      Created: Mon Jan 14, 2014  5:06 PM       Pt is an in patient as she fractured her hip so I am sure she won't be at her appointment.  I see that she was admitted to the hospital on 01/12/14.  Not sure if she was called last week but sure she won't be in. ------

## 2014-01-14 NOTE — Telephone Encounter (Signed)
appt cancelled as requested by Dr. Arbutus Leasat / Oneita KrasSherri S.

## 2014-01-14 NOTE — Progress Notes (Signed)
   Subjective: 1 Day Post-Op Procedure(s) (LRB): ARTHROPLASTY BIPOLAR HIP (Left)  Pt resting comfortably in no acute distress Patient reports pain as mild.  Objective:   VITALS:   Filed Vitals:   01/14/14 0950  BP: 97/63  Pulse: 84  Temp: 97.9 F (36.6 C)  Resp: 16    Left hip incision healing appropriately No erythema or drainage nv intact distally  LABS  Recent Labs  01/12/14 2300 01/13/14 0530 01/14/14 0540  HGB 15.3* 13.7 12.0  HCT 45.8 41.7 35.5*  WBC 8.9 7.5 5.9  PLT 223 196 167     Recent Labs  01/12/14 2300 01/13/14 0530 01/14/14 0540  NA 139  --  139  K 4.5  --  4.0  BUN 33*  --  21  CREATININE 1.03 0.95 0.90  GLUCOSE 116*  --  107*     Assessment/Plan: 1 Day Post-Op Procedure(s) (LRB): ARTHROPLASTY BIPOLAR HIP (Left) PT/OT as able Pain control as needed D/c planning to expected SNF     Brad Florina Glas, MPAS, PA-C  01/14/2014, 10:04 AM

## 2014-01-14 NOTE — Op Note (Signed)
Angel Greene, Angel Greene                 ACCOUNT NO.:  [id number]  MEDICAL RECORD NO.:  [id number]  LOCATION:  [facility]                         FACILITY:  [facility]  PHYSICIAN:  Almedia Balls. Ranell Patrick, M.D. DATE OF BIRTH:  10/27/1929  DATE OF PROCEDURE:  01/13/2014 DATE OF DISCHARGE:                              OPERATIVE REPORT   PREOPERATIVE DIAGNOSIS:  Left displaced femoral neck fracture.  POSTOPERATIVE DIAGNOSIS:  Left displaced femoral neck fracture.  PROCEDURE PERFORMED:  Left hip hemiarthroplasty using DePuy Tri-Lock prosthesis.  ATTENDING SURGEON:  Almedia Balls. Ranell Patrick, MD  ASSISTANT:  Donnie Coffin. Dixon, PA-C, who was scrubbed the entire procedure and necessary for satisfactory completion of surgery.  ANESTHESIA:  General anesthesia was used.  ESTIMATED BLOOD LOSS:  Less than 200 mL.  FLUID REPLACEMENT:  1500 mL of crystalloid.  INSTRUMENT COUNTS:  Correct.  COMPLICATIONS:  There were no complications.  ANTIBIOTICS:  Perioperative antibiotics were given.  INDICATIONS:  Patient is an 80 year old female who suffered a ground- level fall injuring her left hip.  The patient presented with a displaced femoral neck fracture to [facility] Emergency Department, and Orthopedics was consulted.  Discussed options with the patient and family.  We elected proceed with hemiarthroplasty.  Informed consent obtained.  DESCRIPTION OF PROCEDURE:  After adequate level of anesthesia achieved, the patient was positioned supine on the operating room table.  She was brought up into right lateral decubitus position with the left hip up, down leg padded appropriately.  An axillary roll utilized.  Once the patient was stabilized, we sterilely prepped and draped the left hip and leg.  A time-out was called.  We then proceeded with a Aldean Jewett posterior approach to the hip utilizing a 10 blade scalpel for the skin, dissection down through subcutaneous tissues using Bovie.  We identified the tensor  fascia lata, divided in line with the skin incision, and then at this point, identified the short external rotators, divided this out of the posterior aspect of the femur with progressive internal rotation. We identified the fractured hip.  There were no pathologic features identified other than the fractured femoral neck.  At this juncture, we went ahead and performed the neck cut using neck resection guide, 1 fingerbreadth above the lesser trochanter of the femur.  With an oscillating saw, we removed extraneous fracture bone fragments and we then went ahead and retrieved the femoral head which was sized to 45 mm, we resected ligamentum teres ensured that the acetabulum was in good condition which it was.  At this stage, we next went ahead and prepared the femur with initial cookie cutter, then canal initiator, and then a little lateralizer for the femur and then sequential broaching up to size 4, DePuy Tri-Lock prosthesis with appropriate anteversion.  Once that was inserted to the proper depths and version, we went ahead and trialed with the standard offset neck adapter and a 45 monopolar head. We reduced the hip, replaced with soft tissue balancing and stability, with the 90-90, internal rotation, and flexion.  At this point, we retrieved the trial prosthesis, thoroughly irrigated the canal and then proceeded to select the real prosthesis and inserted the size 4 DePuy  Tri-Lock stem with standard offset and inserted that in position with the appropriate version again and then selected a +0 neck adapter.  We then went ahead and impacted a 45 monopolar head, reduced the hip, replaced the stability again and leg lengths.  We then irrigated thoroughly and then closed the capsule with interrupted #1 Vicryl suture followed by the tensor fascia lata with interrupted #1 Vicryl suture figure-of-eight for the most distal portion and then more proximally over the fascia was then just ran a #1  Vicryl running suture in that layer then subcu closure with 2-0 Vicryl followed by staples for skin. Sterile compressive bandage.  The patient was then transferred from the lateral decubitus position to the supine position, and was stable.  Leg lengths were equal.  The patient was awakened, taken in stable condition to the recovery room, having tolerated the surgery well.     Almedia BallsSteven R. Ranell PatrickNorris, M.D.     SRN/MEDQ  D:  01/13/2014  T:  01/13/2014  Job:  [id number]

## 2014-01-14 NOTE — Progress Notes (Signed)
ANTICOAGULATION CONSULT NOTE - Initial Consult  Pharmacy Consult for warfarin Indication: atrial fibrillation, hx DVT  No Known Allergies  Patient Measurements: Height: 5\' 6"  (167.6 cm) Weight: 143 lb 4.8 oz (65 kg) IBW/kg (Calculated) : 59.3  Vital Signs: Temp: 97.9 F (36.6 C) (07/13 0950) Temp src: Oral (07/13 0950) BP: 97/63 mmHg (07/13 0950) Pulse Rate: 84 (07/13 0950)  Labs:  Recent Labs  01/12/14 2300 01/13/14 0530 01/13/14 0536 01/14/14 0540  HGB 15.3* 13.7  --  12.0  HCT 45.8 41.7  --  35.5*  PLT 223 196  --  167  LABPROT 28.5*  --  18.9* 15.6*  INR 2.68*  --  1.58* 1.24  CREATININE 1.03 0.95  --  0.90    Estimated Creatinine Clearance: 45.1 ml/min (by C-G formula based on Cr of 0.9).   Medical History: Past Medical History  Diagnosis Date  . Atrial fibrillation   . Hypertension     hyperlipidemia-  TEE,CARDIOLYTE STRESS 11/12 with LOV  DR TILLEY AND EKG ON CHART  . Peripheral vascular disease     Hx   DVT x 2  2000  . H/O hiatal hernia   . Bladder infection     06/10/11 with treatment by Dr Timothy Lassousso  . Arthritis   . History of breast cancer     2000- breast cancer followed by 6 months of chemo, radiation and  PO meds  . History of DVT (deep vein thrombosis) 06/24/2002  . Parkinsonism     Medications:  Scheduled:  . carbidopa-levodopa  1 tablet Oral Q24H  . carbidopa-levodopa  2 tablet Oral BID  . dofetilide  250 mcg Oral BID  . enoxaparin (LOVENOX) injection  40 mg Subcutaneous Q24H  . fentaNYL  12.5 mcg Transdermal Q72H  . metoprolol tartrate  25 mg Oral BID   Infusions:  . sodium chloride 50 mL/hr at 01/14/14 0759   PRN: acetaminophen, acetaminophen, fentaNYL, HYDROcodone-acetaminophen, HYDROmorphone (DILAUDID) injection, menthol-cetylpyridinium, metoCLOPramide (REGLAN) injection, metoCLOPramide, morphine injection, ondansetron (ZOFRAN) IV, ondansetron, phenol  Assessment: 80 y/o F on chronic warfarin anticoagulation for atrial  fibrillation and hx of DVT, admitted with L hip fracture after a mechanical fall.  Her warfarin [facility]  Patient states her usual regimen is 5 mg daily except 7.5 mg on Mondays.  INR was therapeutic (2.68) on admission    She received vitamin K 10 mg IV on 7/12 at 0126 in preparation for hip fracture repair, which was performed that afternoon (bipolar hip arthroplasty).  Orders are now received to resume warfarin with pharmacy dosing assistance.   Patient is also on prophylactic-dose Lovenox as ordered by MD.   Goal of Therapy:  INR 2-3 Monitor platelets by anticoagulation protocol: Yes   Plan:  1. Warfarin 7.5 mg PO x 1 today at 6pm 2. Lovenox 40 mg SQ q24h as ordered by MD 3. Follow PT/INR daily while inpatient.  Elie Goodyandy Gaila Engebretsen, PharmD, BCPS Pager: [phone] 01/14/2014  11:46 AM

## 2014-01-14 NOTE — Progress Notes (Signed)
[facility] PROGRESS NOTE  ANNIA GOMM ZOX:[id number] DOB: 06/05/1930 DOA: 01/12/2014 PCP: [name], MD  Brief narrative: 80yo woman with left hip fracture s/p correction by orthopedic surgery team.    Assessment/Plan Closed left hip fracture: Post op day 1 of left hip hemiarthroplasty - Doing well, with some pain with movement, making her movement limited - Worked with PT, SNF for rehab - OT deferred assessment at this time.  - Pain control and DVT prophylaxis per surgery (she is on coumadin as an outpatient) and this has been restarted  Atrial fibrillation - Currently rate controlled, on metoprolol and Tikosyn - On coumadin for this and below  History of DVT (deep vein thrombosis) and History of pulmonary embolus - On coumadin with lovenox bridge, resumed post surgery - INR 1.24 today  Hypertension - Some low blood pressures today, will continue to monitor.  She is only on metoprolol and is also receiving tylenol for pain.  - Continue to monitor - On low dose IVF at 50cc/hr, and eating PO well today  Parkinson's disease - Continue sinemet  Diet: Heart healthy  DVT PPx: On lovenox/coumadin   Consultants:  Orthopedic surgery  Procedures/Studies: Dg Hip Complete Left  01/12/2014   CLINICAL DATA:  Left hip pain. Patient fell at 9 a.m. with difficulty walking stents. Left anterior hip pain.  EXAM: LEFT HIP - COMPLETE 2+ VIEW  COMPARISON:  None.  FINDINGS: Transverse subcapital fracture of the left hip with impaction of fracture fragments and valgus angulation. Degenerative changes in the left hip previous right hip hemiarthroplasty. Postoperative changes in the lower lumbar spine. Visualized pelvis appears intact.  IMPRESSION: Subcapital fracture of the left proximal femur.   Electronically Signed   By: Burman Nieves M.D.   On: 01/12/2014 22:23   Ct Head Wo Contrast  01/13/2014   CLINICAL DATA:  Fall, inability to walk.  On anticoagulant.  EXAM: CT HEAD WITHOUT  CONTRAST  TECHNIQUE: Contiguous axial images were obtained from the base of the skull through the vertex without intravenous contrast.  COMPARISON:  07/27/2012  FINDINGS: Patient is angled on the gantry. Right maxillary sinus mucous retention cyst or polyp noted. No acute hemorrhage, infarct, or mass lesion is identified. Mild cortical volume loss with proportional ventricular prominence reidentified. Minimal soft tissue density within the left external auditory canal is compatible with cerumen. No skull fracture. No soft tissue abnormality.  IMPRESSION: No acute intracranial findings.   Electronically Signed   By: Christiana Pellant M.D.   On: 01/13/2014 00:57   Dg Pelvis Portable  01/13/2014   CLINICAL DATA:  Status post hip replacement.  EXAM: PORTABLE PELVIS 1-2 VIEWS  COMPARISON:  Plain films left hip 01/12/2014.  FINDINGS: The patient has a new bipolar left hip hemiarthroplasty. The device is located. No acute fracture is identified. Gas in the soft tissues and surgical staples are noted. Old right hip replacement and postoperative change of lower lumbar fusion noted.  IMPRESSION: Status post left hip replacement without evidence of complication.   Electronically Signed   By: Drusilla Kanner M.D.   On: 01/13/2014 16:33   Dg Chest Port 1 View  01/12/2014   CLINICAL DATA:  Preop left hip fracture.  EXAM: PORTABLE CHEST - 1 VIEW  COMPARISON:  07/27/2012  FINDINGS: There is elevation of the left hemidiaphragm with infiltration or atelectasis in the left lung base. Emphysematous changes in the lungs. Normal heart size and pulmonary vascularity. Probable fibrosis in the apices. Calcified and tortuous aorta. Postoperative  changes in the left axilla.  IMPRESSION: Atelectasis or infiltration in the left lung base. Underlying emphysematous changes.   Electronically Signed   By: Burman Nieves M.D.   On: 01/12/2014 23:37       Antibiotics:  On home ciprofloxacin, unclear how long or course.  Not continued  here.   Code Status: Full Family Communication: Pt at bedside Disposition Plan: SNF when medically stable  HPI/Subjective: No events overnight.  Patient has been up with therapy and notes pain in left hip that is improved with being still and pain medications.    Objective: Filed Vitals:   01/14/14 0600 01/14/14 0757 01/14/14 0950 01/14/14 1148  BP: 114/70  97/63   Pulse: 84  84   Temp:   97.9 F (36.6 C)   TempSrc:   Oral   Resp: 16 18 16 16   Height:      Weight:      SpO2: 98% 95% 98% 96%    Intake/Output Summary (Last 24 hours) at 01/14/14 1324 Last data filed at 01/14/14 1000  Gross per 24 hour  Intake 2600.83 ml  Output    980 ml  Net 1620.83 ml    Exam:   General:  Thin, NAD, Pt is alert, follows commands appropriately  Cardiovascular: Irreg Irreg, normal rate, no murmur noted  Respiratory: Clear to auscultation bilaterally, no wheezing  Abdomen: Soft, non tender, non distended, bowel sounds present, no guarding  Extremities: No edema, moves toes and can feel light pressure, pulses palpable in feet  Neuro: Grossly nonfocal  Data Reviewed: Basic Metabolic Panel:  Recent Labs Lab 01/12/14 2300 01/13/14 0530 01/14/14 0540  NA 139  --  139  K 4.5  --  4.0  CL 95*  --  100  CO2 29  --  29  GLUCOSE 116*  --  107*  BUN 33*  --  21  CREATININE 1.03 0.95 0.90  CALCIUM 10.5  --  8.6  MG  --   --  1.6   CBC:  Recent Labs Lab 01/12/14 2300 01/13/14 0530 01/14/14 0540  WBC 8.9 7.5 5.9  NEUTROABS 7.0  --   --   HGB 15.3* 13.7 12.0  HCT 45.8 41.7 35.5*  MCV 95.2 95.9 94.2  PLT 223 196 167     Recent Results (from the past 240 hour(s))  SURGICAL PCR SCREEN     Status: None   Collection Time    01/13/14  4:12 AM      Result Value Ref Range Status   MRSA, PCR NEGATIVE  NEGATIVE Final   Staphylococcus aureus NEGATIVE  NEGATIVE Final   Comment:            The Xpert SA Assay (FDA     approved for NASAL specimens     in patients over 21 years  of age),     is one component of     a comprehensive surveillance     program.  Test performance has     been validated by [employer] for patients greater     than or equal to 15 year old.     It is not intended     to diagnose infection nor to     guide or monitor treatment.     Scheduled Meds: . carbidopa-levodopa  1 tablet Oral Q24H  . carbidopa-levodopa  2 tablet Oral BID  . dofetilide  250 mcg Oral BID  . enoxaparin (LOVENOX) injection  40 mg Subcutaneous Q24H  . fentaNYL  12.5 mcg Transdermal Q72H  . metoprolol tartrate  25 mg Oral BID  . warfarin  7.5 mg Oral ONCE-1800  . Warfarin - Pharmacist Dosing Inpatient   Does not apply q1800   Continuous Infusions: . sodium chloride 50 mL/hr at 01/14/14 0759     Debe CoderMULLEN, EMILY, MD  [facility] Pager [phone] If 7PM-7AM, please contact night-coverage [email or web address] Password [email or web address] 01/14/2014, 1:24 PM   LOS: 2 days

## 2014-01-14 NOTE — Progress Notes (Signed)
Physical Therapy Treatment Patient Details Name: Angel Greene MRN: [id number] DOB: 1930/07/03 Today's Date: 01/14/2014    History of Present Illness Pt  admitted 01/12/14 after a fall and fracture d L hip. S/p L hip hemiarthroplasty 01/13/14- posterior approach. Pt has h/o Parkinson's .    PT Comments    Pt continues to tolerate mobility, placing weight on L leg.  Follow Up Recommendations  SNF;Supervision/Assistance - 24 hour     Equipment Recommendations  None recommended by PT    Recommendations for Other Services       Precautions / Restrictions Precautions Precautions: Fall;Posterior Hip Restrictions LLE Weight Bearing: Weight bearing as tolerated    Mobility  Bed Mobility Overal bed mobility: Needs Assistance;+2 for physical assistance;+ 2 for safety/equipment Bed Mobility: Sit to Supine       Sit to supine: Mod assist   General bed mobility comments: cues for hip precautios, assist Legs onto bed.  Transfers Overall transfer level: Needs assistance Equipment used: Rolling walker (2 wheeled) Transfers: Sit to/from [employer];Stand Pivot Transfers Sit to Stand: +2 physical assistance;+2 safety/equipment;Mod assist;From elevated surface Stand pivot transfers: Mod assist;+2 physical assistance;+2 safety/equipment       General transfer comment: cues for precautions, for use of RW , for WBAT.  Ambulation/Gait                 Stairs            Wheelchair Mobility    Modified Rankin (Stroke Patients Only)       Balance                                    Cognition Arousal/Alertness: Awake/alert   Overall Cognitive Status: Difficult to assess Area of Impairment: Following commands     Memory: Decreased recall of precautions              Exercises      General Comments        Pertinent Vitals/Pain Reports pain is mild.    Home Living                      Prior Function            PT Goals  (current goals can now be found in the care plan section) Progress towards PT goals: Progressing toward goals    Frequency  Min 3X/week    PT Plan Current plan remains appropriate    Co-evaluation             End of Session   Activity Tolerance: Patient tolerated treatment well Patient left: in bed;with call bell/phone within reach;with family/visitor present     Time: [id number] PT Time Calculation (min): 17 min  Charges:  $Therapeutic Activity: 8-22 mins                    G Codes:      Angel Greene, Angel Greene 01/14/2014, 3:13 PM

## 2014-01-14 NOTE — Progress Notes (Signed)
OT Cancellation Note  Patient Details Name: Angel Greene MRN: [id number] DOB: Sep 29, 1929   Cancelled Treatment:    Noted plans for SNF. Will defer OT eval to SNF [name], [place] KungLorraine D 01/14/2014, 12:09 PM

## 2014-01-14 NOTE — Progress Notes (Signed)
INITIAL NUTRITION ASSESSMENT  DOCUMENTATION CODES Per approved criteria  -Not Applicable   INTERVENTION: Ensure Complete po BID, each supplement provides 350 kcal and 13 grams of protein Provide preferences and encourage PO intake. RD to monitor.  NUTRITION DIAGNOSIS: Inadequate oral intake related to post op as evidenced by patient report.   Goal: Intake of meals and supplements to meet >90% estimated needs.  Monitor:  Intake, labs, weight trend.  Reason for Assessment: consult s/p fx hip  80 y.o. female  Admitting Dx: Closed left hip fracture  ASSESSMENT: Patient admitted s/p hip fracture.   Poor-fair intake regular diet. Good intake prior to admit.  Patient and husband cooked. Patient to be placed for rehab. Patient with about 30 lb weight loss in the past 1 1/2 year but stable for the past 4 years.  Height: Ht Readings from Last 1 Encounters:  01/13/14 5\' 6"  (1.676 m)    Weight: Wt Readings from Last 1 Encounters:  01/13/14 143 lb 4.8 oz (65 kg)    Ideal Body Weight: 130 lbs  % Ideal Body Weight: 110  Wt Readings from Last 10 Encounters:  01/13/14 143 lb 4.8 oz (65 kg)  01/13/14 143 lb 4.8 oz (65 kg)  09/10/13 138 lb (62.596 kg)  01/24/13 156 lb (70.761 kg)  10/30/12 161 lb (73.029 kg)  07/28/12 167 lb 8.8 oz (76 kg)  07/24/12 166 lb (75.297 kg)  05/26/12 174 lb (78.926 kg)  04/11/12 171 lb (77.565 kg)  02/02/12 176 lb (79.833 kg)    Usual Body Weight: 138 lbs 4 months ago.  % Usual Body Weight: 104  BMI:  Body mass index is 23.14 kg/(m^2).  Estimated Nutritional Needs: Kcal: 1800-2000 Protein: 70-80 gm  Fluid: 1.8 L daily  Skin: incision  Diet Order: Cardiac  EDUCATION NEEDS: -Education needs addressed   Intake/Output Summary (Last 24 hours) at 01/14/14 1408 Last data filed at 01/14/14 1403  Gross per 24 hour  Intake 2920.83 ml  Output   1434 ml  Net 1486.83 ml     Labs:   Recent Labs Lab 01/12/14 2300 01/13/14 0530  01/14/14 0540  NA 139  --  139  K 4.5  --  4.0  CL 95*  --  100  CO2 29  --  29  BUN 33*  --  21  CREATININE 1.03 0.95 0.90  CALCIUM 10.5  --  8.6  MG  --   --  1.6  GLUCOSE 116*  --  107*    CBG (last 3)  No results found for this basename: GLUCAP,  in the last 72 hours  Scheduled Meds: . carbidopa-levodopa  1 tablet Oral Q24H  . carbidopa-levodopa  2 tablet Oral BID  . dofetilide  250 mcg Oral BID  . enoxaparin (LOVENOX) injection  40 mg Subcutaneous Q24H  . fentaNYL  12.5 mcg Transdermal Q72H  . metoprolol tartrate  25 mg Oral BID  . warfarin  7.5 mg Oral ONCE-1800  . Warfarin - Pharmacist Dosing Inpatient   Does not apply q1800    Continuous Infusions: . sodium chloride 50 mL/hr at 01/14/14 0759    Past Medical History  Diagnosis Date  . Atrial fibrillation   . Hypertension     hyperlipidemia-  TEE,CARDIOLYTE STRESS 11/12 with LOV  DR TILLEY AND EKG ON CHART  . Peripheral vascular disease     Hx   DVT x 2  2000  . H/O hiatal hernia   . Bladder infection  06/10/11 with treatment by Dr Timothy Lasso  . Arthritis   . History of breast cancer     2000- breast cancer followed by 6 months of chemo, radiation and  PO meds  . History of DVT (deep vein thrombosis) 06/24/2002  . Parkinsonism     Past Surgical History  Procedure Laterality Date  . Breast surgery      LUMPECTOMY  WITH AXILLIARY DISSECTION   2000  . Back surgery      2006 - X-Stop spacers placedL3-L4, L4-L5; 2008 - laminectomy and fusion, L3-L5; 2011 Laminectomy and extension of fusion L5-S1  . Tonsillectomy    . Cardiac catheterization      bilateral cataract extraction with IOL  . Cholecystectomy    . Joint replacement      RIGHT HIP REPLACEMENT   1996  . Total knee arthroplasty  06/23/2011    Procedure: TOTAL KNEE ARTHROPLASTY;  Surgeon: Loanne Drilling;  Location: [facility];  Service: Orthopedics;  Laterality: Right;  . Breast lumpectomy    . Mastectomy    . Breast lumpectomy      Oran Rein, RD,  LDN Clinical Inpatient Dietitian Pager:  [phone] Weekend and after hours pager:  [phone]

## 2014-01-14 NOTE — ED Provider Notes (Signed)
Medical screening examination/treatment/procedure(s) were conducted as a shared visit with non-physician practitioner(s) and myself.  I personally evaluated the patient during the encounter.   EKG Interpretation   Date/Time:  Saturday January 12 2014 22:49:09 EDT Ventricular Rate:  101 PR Interval:  195 QRS Duration: 138 QT Interval:  356 QTC Calculation: 461 R Axis:   38 Text Interpretation:  Sinus tachycardia Multiform ventricular premature  complexes Right bundle branch block Baseline wander in lead(s) I aVL  Confirmed by Rubin PayorPICKERING  MD, Harrold DonathNATHAN [phone]([id number]) on 01/12/2014 11:02:54 PM     Patient with fall. Hip fracture on left. Is on anticoagulation. Will admit to internal medicine with ortho consult  Juliet Rudeathan R. Rubin PayorPickering, MD 01/14/14 [phone]

## 2014-01-14 NOTE — Progress Notes (Addendum)
Clinical Social Work Department CLINICAL SOCIAL WORK PLACEMENT NOTE 01/14/2014  Patient:  Angel AquasSIGMON,[name]  Account Number:  [id number] Admit date:  01/12/2014  Clinical Social Worker:  Cori RazorJAMIE Damyiah Moxley, LCSW  Date/time:  01/14/2014 11:10 AM  Clinical Social Work is seeking post-discharge placement for this patient at the following level of care:   SKILLED NURSING   (*CSW will update this form in Epic as items are completed)     Patient/family provided with [facility] Department of Clinical Social Work's list of facilities offering this level of care within the geographic area requested by the patient (or if unable, by the patient's family).  01/14/2014  Patient/family informed of their freedom to choose among providers that offer the needed level of care, that participate in Medicare, Medicaid or managed care program needed by the patient, have an available bed and are willing to accept the patient.    Patient/family informed of [facility]' ownership interest in [facility], as well as of the fact that they are under no obligation to receive care at this facility.  PASARR submitted to EDS on 01/14/2014 PASARR number received on 01/14/2014  FL2 transmitted to all facilities in geographic area requested by pt/family on  01/14/2014 FL2 transmitted to all facilities within larger geographic area on   Patient informed that his/her managed care company has contracts with or will negotiate with  certain facilities, including the following:     Patient/family informed of bed offers received:  01/14/2014 [facility] and patient chooses bed at    Patient to be transferred to  on   Patient to be transferred to facility by  Patient and family notified of transfer on  Name of family member notified:    The following physician request were entered in Epic:   Additional Comments:  [name] LCSW [phone]

## 2014-01-15 ENCOUNTER — Ambulatory Visit: Payer: Medicare Other | Admitting: Neurology

## 2014-01-15 DIAGNOSIS — I10 Essential (primary) hypertension: Secondary | ICD-10-CM

## 2014-01-15 LAB — BASIC METABOLIC PANEL
ANION GAP: 8 (ref 5–15)
BUN: 18 mg/dL (ref 6–23)
CO2: 31 mEq/L (ref 19–32)
CREATININE: 0.83 mg/dL (ref 0.50–1.10)
Calcium: 8.8 mg/dL (ref 8.4–10.5)
Chloride: 100 mEq/L (ref 96–112)
GFR calc non Af Amer: 64 mL/min — ABNORMAL LOW (ref >90)
GFR, EST AFRICAN AMERICAN: 74 mL/min — AB (ref >90)
Glucose, Bld: 102 mg/dL — ABNORMAL HIGH (ref 70–99)
POTASSIUM: 4 meq/L (ref 3.7–5.3)
Sodium: 139 mEq/L (ref 137–147)

## 2014-01-15 LAB — PROTIME-INR
INR: 1.23 (ref 0.00–1.49)
PROTHROMBIN TIME: 15.5 s — AB (ref 11.6–15.2)

## 2014-01-15 LAB — CBC
HCT: 36.3 % (ref 36.0–46.0)
Hemoglobin: 11.8 g/dL — ABNORMAL LOW (ref 12.0–15.0)
MCH: 31.6 pg (ref 26.0–34.0)
MCHC: 32.5 g/dL (ref 30.0–36.0)
MCV: 97.1 fL (ref 78.0–100.0)
Platelets: 177 10*3/uL (ref 150–400)
RBC: 3.74 MIL/uL — ABNORMAL LOW (ref 3.87–5.11)
RDW: 15.1 % (ref 11.5–15.5)
WBC: 8.1 10*3/uL (ref 4.0–10.5)

## 2014-01-15 LAB — MAGNESIUM: MAGNESIUM: 1.7 mg/dL (ref 1.5–2.5)

## 2014-01-15 MED ORDER — TRAMADOL HCL 50 MG PO TABS: 50.0000 mg | ORAL_TABLET | Freq: Four times a day (QID) | ORAL | Status: AC | PRN

## 2014-01-15 MED ORDER — ENOXAPARIN SODIUM 60 MG/0.6ML ~~LOC~~ SOLN
1.0000 mg/kg | SUBCUTANEOUS | Status: DC
  Filled 2014-01-15: qty 0.6

## 2014-01-15 MED ORDER — WARFARIN SODIUM 7.5 MG PO TABS
7.5000 mg | ORAL_TABLET | Freq: Once | ORAL | Status: DC
  Filled 2014-01-15: qty 1

## 2014-01-15 MED ORDER — HYDROCODONE-ACETAMINOPHEN 5-325 MG PO TABS: 1.0000 | ORAL_TABLET | Freq: Four times a day (QID) | ORAL | Status: DC | PRN

## 2014-01-15 MED ORDER — MAGNESIUM SULFATE 40 MG/ML IJ SOLN
2.0000 g | Freq: Once | INTRAMUSCULAR | Status: AC
  Administered 2014-01-15: 2 g via INTRAVENOUS
  Filled 2014-01-15: qty 50

## 2014-01-15 MED ORDER — FENTANYL 12 MCG/HR TD PT72: 12.5000 ug | MEDICATED_PATCH | TRANSDERMAL | Status: AC

## 2014-01-15 MED ORDER — ENOXAPARIN SODIUM 60 MG/0.6ML ~~LOC~~ SOLN: 1.0000 mg/kg | SUBCUTANEOUS | Status: DC

## 2014-01-15 NOTE — Progress Notes (Signed)
ANTICOAGULATION CONSULT NOTE - Follow-up Consult  Pharmacy Consult for warfarin Indication: atrial fibrillation, hx DVT  No Known Allergies  Patient Measurements: Height: 5\' 6"  (167.6 cm) Weight: 143 lb 4.8 oz (65 kg) IBW/kg (Calculated) : 59.3  Vital Signs: Temp: 98.3 F (36.8 C) (07/14 0451) Temp src: Oral (07/14 0451) BP: 108/66 mmHg (07/14 0451) Pulse Rate: 77 (07/14 0451)  Labs:  Recent Labs  01/13/14 0530 01/13/14 0536 01/14/14 0540 01/15/14 0447  HGB 13.7  --  12.0 11.8*  HCT 41.7  --  35.5* 36.3  PLT 196  --  167 177  LABPROT  --  18.9* 15.6* 15.5*  INR  --  1.58* 1.24 1.23  CREATININE 0.95  --  0.90 0.83    Estimated Creatinine Clearance: 48.9 ml/min (by C-G formula based on Cr of 0.83).   Medical History: Past Medical History  Diagnosis Date  . Atrial fibrillation   . Hypertension     hyperlipidemia-  TEE,CARDIOLYTE STRESS 11/12 with LOV  DR TILLEY AND EKG ON CHART  . Peripheral vascular disease     Hx   DVT x 2  2000  . H/O hiatal hernia   . Bladder infection     06/10/11 with treatment by Dr Timothy Lassousso  . Arthritis   . History of breast cancer     2000- breast cancer followed by 6 months of chemo, radiation and  PO meds  . History of DVT (deep vein thrombosis) 06/24/2002  . Parkinsonism     Medications:  Scheduled:  . carbidopa-levodopa  1 tablet Oral Q24H  . carbidopa-levodopa  2 tablet Oral BID  . dofetilide  250 mcg Oral BID  . enoxaparin (LOVENOX) injection  40 mg Subcutaneous Q24H  . feeding supplement (ENSURE COMPLETE)  237 mL Oral BID BM  . fentaNYL  12.5 mcg Transdermal Q72H  . magnesium sulfate 1 - 4 g bolus IVPB  2 g Intravenous Once  . metoprolol tartrate  25 mg Oral BID  . Warfarin - Pharmacist Dosing Inpatient   Does not apply q1800   Infusions:  . sodium chloride 50 mL/hr at 01/15/14 0245   PRN: acetaminophen, acetaminophen, fentaNYL, HYDROcodone-acetaminophen, HYDROmorphone (DILAUDID) injection, menthol-cetylpyridinium,  metoCLOPramide (REGLAN) injection, metoCLOPramide, morphine injection, ondansetron (ZOFRAN) IV, ondansetron, phenol  Assessment: 80 y/o F on chronic warfarin anticoagulation for atrial fibrillation and hx of DVT, admitted with L hip fracture after a mechanical fall.  Her warfarin is [facility] by [facility].  Patient states her usual regimen is 5 mg daily except 7.5 mg on Mondays.  INR was therapeutic (2.68) on admission    She received vitamin K 10 mg IV on 7/12 at 0126 in preparation for hip fracture repair, which was performed that afternoon (bipolar hip arthroplasty).  Orders are now received to resume warfarin with pharmacy dosing assistance.   Patient is also on prophylactic-dose Lovenox as ordered by MD.   INR today 1.23   CBC: Hgb down to 11.8 s/p ortho surgery Drug Interactions: none noted Diet: Cardiac diet and ensure   Goal of Therapy:  INR 2-3 Monitor platelets by anticoagulation protocol: Yes   Plan:  1. Warfarin 7.5 mg PO x 1 today at 6pm 2. Lovenox 40 mg SQ q24h as ordered by MD 3. Follow PT/INR daily while inpatient.   Loma BostonLaura Tyrica Afzal, PharmD Pager: [phone] 01/15/2014 8:08 AM

## 2014-01-15 NOTE — Discharge Summary (Addendum)
Physician Discharge Summary  Angel Greene:[id number] DOB: 1929-07-08 DOA: 01/12/2014  PCP: [name], MD  Admit date: 01/12/2014 Discharge date: 01/15/2014  Recommendations for Outpatient Follow-up:  1. Pt will need to follow up with PCP in 2-3 weeks post discharge from SNF 2. Please also check CBC to evaluate Hg and Hct levels 3. She will be discharged to SNF for rehab 4. She will need INR and modification of coumadin regimen for Afib.  She is being discharged on 5mg  daily except 7.5mg  on Mondays of coumadin.  She will need regular INRs.  She will also be on lovenox bridge until she reaches goal INR  Discharge Diagnoses:  Principal Problem:   Closed left hip fracture Active Problems:   Atrial fibrillation   History of DVT (deep vein thrombosis)   History of pulmonary embolus   Hypertension   Femoral neck fracture  Discharge Condition: Stable  Diet recommendation: Heart healthy diet  History of present illness as recorded in Dr. [place] Service H&P:  Angel Greene is a 80 y.o. female who suffered a mechanical fall while turning away from her fridge at home. No LOC. After fall she had L hip pain and inability to ambulate so was brought in to the ED.   Hospital Course:   Closed left hip fracture:  Ms. Janicki was admitted for a mechanical fall and left hip fracture.  Orthopedic surgery was consulted and she was taken for repair of the hip on 01/13/14.  She was anticoagulated with coumadin prior to surgery due to atrial fibrillation.  This was reversed with vitamin K and her INR did normalize.  She has been started back on coumadin (see below).  She underwent PT and OT and SNF with Rehab was recommended.    Instructions from Orthopedic Surgery Posterior hip precautions.  Keep incision clean and dry for one week, then ok to get wet in the shower; Follow up with Dr Ranell Patrick at [facility] in two weeks [phone]; Weight Bearing as Tolerated  Atrial fibrillation with History of DVT  (deep vein thrombosis) and History of pulmonary embolus: she has been reinitiated on coumadin, INR continues to be low.  She will need frequent INR checks and titration of coumadin dose based on INR.  She will be placed on lovenox bridge with 60mg  of lovenox daily until she reaches goal INR.  Goal INR would be 2-3.  Per pharmacy notes, her usual regimen is 5mg  daily except 7.5mg  on Mondays.  Will plan to discharge on this regimen with follow up at the SNF.   Hypertension: She has had some mildly low blood pressures while in the hospital, but without any dizziness per patient.  She is only on metoprolol, but she is on a fentanyl patch.  She will need encouragement to eat and drink normally as an outpatient.   Chronic pain: Unclear reason, she is on fentanyl patch as outpatient along with lidoderm patch.  This has been continued with vicoden PRN for breakthrough pain.   Parkinson's disease: Stable.  Continue sinemet   Procedures/Studies: Dg Hip Complete Left  01/12/2014   CLINICAL DATA:  Left hip pain. Patient fell at 9 a.m. with difficulty walking stents. Left anterior hip pain.  EXAM: LEFT HIP - COMPLETE 2+ VIEW  COMPARISON:  None.  FINDINGS: Transverse subcapital fracture of the left hip with impaction of fracture fragments and valgus angulation. Degenerative changes in the left hip previous right hip hemiarthroplasty. Postoperative changes in the lower lumbar spine. Visualized pelvis appears intact.  IMPRESSION: Subcapital fracture of the left proximal femur.   Electronically Signed   By: Burman Nieves M.D.   On: 01/12/2014 22:23   Ct Head Wo Contrast  01/13/2014   CLINICAL DATA:  Fall, inability to walk.  On anticoagulant.  EXAM: CT HEAD WITHOUT CONTRAST  TECHNIQUE: Contiguous axial images were obtained from the base of the skull through the vertex without intravenous contrast.  COMPARISON:  07/27/2012  FINDINGS: Patient is angled on the gantry. Right maxillary sinus mucous retention cyst or  polyp noted. No acute hemorrhage, infarct, or mass lesion is identified. Mild cortical volume loss with proportional ventricular prominence reidentified. Minimal soft tissue density within the left external auditory canal is compatible with cerumen. No skull fracture. No soft tissue abnormality.  IMPRESSION: No acute intracranial findings.   Electronically Signed   By: Christiana Pellant M.D.   On: 01/13/2014 00:57   Dg Pelvis Portable  01/13/2014   CLINICAL DATA:  Status post hip replacement.  EXAM: PORTABLE PELVIS 1-2 VIEWS  COMPARISON:  Plain films left hip 01/12/2014.  FINDINGS: The patient has a new bipolar left hip hemiarthroplasty. The device is located. No acute fracture is identified. Gas in the soft tissues and surgical staples are noted. Old right hip replacement and postoperative change of lower lumbar fusion noted.  IMPRESSION: Status post left hip replacement without evidence of complication.   Electronically Signed   By: Drusilla Kanner M.D.   On: 01/13/2014 16:33   Dg Chest Port 1 View  01/12/2014   CLINICAL DATA:  Preop left hip fracture.  EXAM: PORTABLE CHEST - 1 VIEW  COMPARISON:  07/27/2012  FINDINGS: There is elevation of the left hemidiaphragm with infiltration or atelectasis in the left lung base. Emphysematous changes in the lungs. Normal heart size and pulmonary vascularity. Probable fibrosis in the apices. Calcified and tortuous aorta. Postoperative changes in the left axilla.  IMPRESSION: Atelectasis or infiltration in the left lung base. Underlying emphysematous changes.   Electronically Signed   By: Burman Nieves M.D.   On: 01/12/2014 23:37       Consultations:  Orthopedics  PT  OT  Antibiotics:  None   Discharge Exam: Filed Vitals:   01/15/14 1023  BP: 92/44  Pulse: 79  Temp: 98.9 F (37.2 C)  Resp: 16   Filed Vitals:   01/15/14 0205 01/15/14 0451 01/15/14 0800 01/15/14 1023  BP: 98/66 108/66  92/44  Pulse: 86 77  79  Temp: 98.5 F (36.9 C) 98.3 F  (36.8 C)  98.9 F (37.2 C)  TempSrc: Oral Oral    Resp: 18 16 16 16   Height:      Weight:      SpO2: 96% 100% 94% 92%    General: Pt is alert, follows commands appropriately, not in acute distress Cardiovascular: Irreg, Irreg, S1/S2 +, no murmurs Respiratory: Clear to auscultation bilaterally, no wheezing Abdominal: Soft, non tender, non distended, bowel sounds + Extremities: no cyanosis, she has decreased pain with movement of leg.    Discharge Instructions      Discharge Instructions   Discharge patient    Complete by:  As directed   To SNF     Weight bearing as tolerated    Complete by:  As directed   Laterality:  left  Extremity:  Lower            Medication List         acetaminophen 325 MG tablet  Commonly known as:  TYLENOL  Take 2 tablets (650 mg total) by mouth every 6 (six) hours as needed.     CALTRATE 600+D 600-400 MG-UNIT per chew tablet  Generic drug:  Calcium Carbonate-Vitamin D  Chew 2 tablets by mouth daily. 2 tablets in the morning, 1 tablet at noon, and 2 tablets at night.     carbidopa-levodopa 25-100 MG per tablet  Commonly known as:  SINEMET IR  Take 1-2 tablets by mouth 3 (three) times daily.     ciprofloxacin 250 MG tablet  Commonly known as:  CIPRO  Take 250 mg by mouth 2 (two) times daily.     dofetilide 250 MCG capsule  Commonly known as:  TIKOSYN  Take 250 mcg by mouth 2 (two) times daily.     enoxaparin 60 MG/0.6ML injection  Commonly known as:  LOVENOX  Inject 0.6 mLs (60 mg total) into the skin daily.  Start taking on:  01/16/2014     fentaNYL 12 MCG/HR  Commonly known as:  DURAGESIC - dosed mcg/hr  Place 1 patch onto the skin every 3 (three) days.     furosemide 40 MG tablet  Commonly known as:  LASIX  Take 20 mg by mouth as needed for fluid.     lidocaine 5 %  Commonly known as:  LIDODERM  Place 1 patch onto the skin daily. Remove & Discard patch within 12 hours or as directed by MD     metoprolol tartrate 25 MG  tablet  Commonly known as:  LOPRESSOR  Take 1 tablet (25 mg total) by mouth 2 (two) times daily.     omega-3 acid ethyl esters 1 G capsule  Commonly known as:  LOVAZA  Take 1 g by mouth daily.     potassium chloride SA 20 MEQ tablet  Commonly known as:  K-DUR,KLOR-CON  Take 1 tablet (20 mEq total) by mouth daily.     spironolactone 25 MG tablet  Commonly known as:  ALDACTONE  Take 25 mg by mouth 2 (two) times daily.     traMADol 50 MG tablet  Commonly known as:  ULTRAM  - Take 50 mg by mouth every 6 (six) hours as needed. Maximum dose= 8 tablets per day. PAIN  -      warfarin 5 MG tablet  Commonly known as:  COUMADIN  Take 5 mg by mouth daily.       Follow-up Information   Follow up with NORRIS,STEVEN R, MD. Call in 2 weeks. [phone]    Specialty:  Orthopedic Surgery   Contact information:   [address] [place] [address] [phone]        The results of significant diagnostics from this hospitalization (including imaging, microbiology, ancillary and laboratory) are listed below for reference.     Microbiology: Recent Results (from the past 240 hour(s))  SURGICAL PCR SCREEN     Status: None   Collection Time    01/13/14  4:12 AM      Result Value Ref Range Status   MRSA, PCR NEGATIVE  NEGATIVE Final   Staphylococcus aureus NEGATIVE  NEGATIVE Final   Comment:            The Xpert SA Assay (FDA     approved for NASAL specimens     in patients over 78 years of age),     is one component of     a comprehensive surveillance     program.  Test performance has     been validated by  [facility] for patients greater     than or equal to 23 year old.     It is not intended     to diagnose infection nor to     guide or monitor treatment.     Labs: Basic Metabolic Panel:  Recent Labs Lab 01/12/14 2300 01/13/14 0530 01/14/14 0540 01/15/14 0447  NA 139  --  139 139  K 4.5  --  4.0 4.0  CL 95*  --  100 100  CO2 29  --  29 31   GLUCOSE 116*  --  107* 102*  BUN 33*  --  21 18  CREATININE 1.03 0.95 0.90 0.83  CALCIUM 10.5  --  8.6 8.8  MG  --   --  1.6 1.7   CBC:  Recent Labs Lab 01/12/14 2300 01/13/14 0530 01/14/14 0540 01/15/14 0447  WBC 8.9 7.5 5.9 8.1  NEUTROABS 7.0  --   --   --   HGB 15.3* 13.7 12.0 11.8*  HCT 45.8 41.7 35.5* 36.3  MCV 95.2 95.9 94.2 97.1  PLT 223 196 167 177     SIGNED: Time coordinating discharge: Over 30 minutes  Debe Coder, MD  [facility] 01/15/2014, 12:41 PM Pager [phone]  If 7PM-7AM, please contact night-coverage [email or web address] Password [email or web address]

## 2014-01-15 NOTE — Progress Notes (Signed)
Orthopedics Progress Note  Subjective: My hip feels better.  Objective:  Filed Vitals:   01/15/14 1023  BP: 92/44  Pulse: 79  Temp: 98.9 F (37.2 C)  Resp: 16    General: Awake and alert  Musculoskeletal: Left hip dressing intact.  No erythema, no swelling Neurovascularly intact  Lab Results  Component Value Date   WBC 8.1 01/15/2014   HGB 11.8* 01/15/2014   HCT 36.3 01/15/2014   MCV 97.1 01/15/2014   PLT 177 01/15/2014       Component Value Date/Time   NA 139 01/15/2014 0447   K 4.0 01/15/2014 0447   CL 100 01/15/2014 0447   CO2 31 01/15/2014 0447   GLUCOSE 102* 01/15/2014 0447   BUN 18 01/15/2014 0447   CREATININE 0.83 01/15/2014 0447   CALCIUM 8.8 01/15/2014 0447   GFRNONAA 64* 01/15/2014 0447   GFRAA 74* 01/15/2014 0447    Lab Results  Component Value Date   INR 1.23 01/15/2014   INR 1.24 01/14/2014   INR 1.58* 01/13/2014    Assessment/Plan: POD #2 s/p Procedure(s): ARTHROPLASTY BIPOLAR HIP Stable [facility].   [facility] Dr Ranell PatrickNorris  [phone] WBAT left hip D/C orders in for weight bearing, pain meds, and DVT prophylaxis  Almedia BallsSteven R. Ranell PatrickNorris, MD 01/15/2014 12:51 PM

## 2014-01-15 NOTE — Discharge Instructions (Signed)
Posterior hip precautions.  Keep incision clean and dry for one week, then ok to get wet in the shower  [facility] two [facility] [phone]  Weight Bearing as Tolerated

## 2014-01-15 NOTE — Progress Notes (Signed)
Clinical Social Work Department CLINICAL SOCIAL WORK PLACEMENT NOTE 01/15/2014  Patient:  Angel Greene,[name]  Account Number:  [id number] Admit date:  01/12/2014  Clinical Social Worker:  Cori RazorJAMIE Lazette Estala, LCSW  Date/time:  01/14/2014 11:10 AM  Clinical Social Work is seeking post-discharge placement for this patient at the following level of care:   SKILLED NURSING   (*CSW will update this form in Epic as items are completed)     Patient/family provided with [facility] Department of Clinical Social Work's list of facilities offering this level of care within the geographic area requested by the patient (or if unable, by the patient's family).  01/14/2014  Patient/family informed of their freedom to choose among providers that offer the needed level of care, that participate in Medicare, Medicaid or managed care program needed by the patient, have an available bed and are willing to accept the patient.    Patient/family informed of [facility]' ownership interest in [facility], as well as of the fact that they are under no obligation to receive care at this facility.  PASARR submitted to EDS on 01/14/2014 PASARR number received on 01/14/2014  FL2 transmitted to all facilities in geographic area requested by pt/family on  01/14/2014 FL2 transmitted to all facilities within larger geographic area on   Patient informed that his/her managed care company has contracts with or will negotiate with  certain facilities, including the following:     Patient/family informed of bed offers received:  01/14/2014 [facility] Physician recommends and patient chooses bed at    [facility] on  01/15/2014 Patient to be transferred to facility by [facility] Patient and family notified of transfer on 01/15/2014 Name of family member notified:  SPOUSE  The following physician request were entered in  Epic:   Additional Comments:  Pt / spouse are in agreemnet with d/c to SNF today via [facility] transport. NSG reviewed d/c summary,scripts,avs. Scripts are included in d/c packet.  Cori RazorJamie Yue Glasheen LCSW [phone]

## 2014-01-15 NOTE — Progress Notes (Signed)
[facility] for today if ready for d/c.   Cori RazorJamie Martha Soltys LCSW [phone]

## 2014-01-15 NOTE — Progress Notes (Signed)
   Subjective: 2 Days Post-Op Procedure(s) (LRB): ARTHROPLASTY BIPOLAR HIP (Left)  Pt resting in no acute distress  Patient reports pain as none.  Objective:   VITALS:   Filed Vitals:   01/15/14 0451  BP: 108/66  Pulse: 77  Temp: 98.3 F (36.8 C)  Resp: 16    Left hip incision healing well nv intact distally No rashes, edema, erythema or drainage  LABS  Recent Labs  01/13/14 0530 01/14/14 0540 01/15/14 0447  HGB 13.7 12.0 11.8*  HCT 41.7 35.5* 36.3  WBC 7.5 5.9 8.1  PLT 196 167 177     Recent Labs  01/12/14 2300 01/13/14 0530 01/14/14 0540 01/15/14 0447  NA 139  --  139 139  K 4.5  --  4.0 4.0  BUN 33*  --  21 18  CREATININE 1.03 0.95 0.90 0.83  GLUCOSE 116*  --  107* 102*     Assessment/Plan: 2 Days Post-Op Procedure(s) (LRB): ARTHROPLASTY BIPOLAR HIP (Left)  Agree with SNF placement PT/OT as able Will continue to monitor  F/u in 2 weeks once discharged at our office    [facility] Y[facility]ann Curl, MPAS, PA-C  01/15/2014, 7:39 AM

## 2014-01-15 NOTE — Progress Notes (Signed)
[facility] PROGRESS NOTE  Angel Greene ZOX:[id number] DOB: 09/06/1929 DOA: 01/12/2014 PCP: [name], MD  Brief narrative: 80yo woman with left hip fracture s/p correction by orthopedic surgery team.    Assessment/Plan Closed left hip fracture: Post op day 2 of left hip hemiarthroplasty - Doing well, pain somewhat improved with movement.  - Plan for SNF with rehab.  - Pain control with fentanyl patch and breakthrough with tylenol.   Atrial fibrillation - Currently rate controlled, on metoprolol and Tikosyn - Coumadin restarted  History of DVT (deep vein thrombosis) and History of pulmonary embolus - On coumadin with lovenox bridge, resumed post surgery - INR 1.23 today - She will need to be on lovenox until INR 2-3  Hypertension - Continues to have soft pressures, likely related to pain medication (fentanyl patch) - Continue to monitor - Eating well today.   Parkinson's disease - Continue sinemet  Diet: Heart healthy  DVT PPx: On lovenox/coumadin   Consultants:  Orthopedic surgery  Procedures/Studies: Dg Pelvis Portable  01/13/2014   CLINICAL DATA:  Status post hip replacement.  EXAM: PORTABLE PELVIS 1-2 VIEWS  COMPARISON:  Plain films left hip 01/12/2014.  FINDINGS: The patient has a new bipolar left hip hemiarthroplasty. The device is located. No acute fracture is identified. Gas in the soft tissues and surgical staples are noted. Old right hip replacement and postoperative change of lower lumbar fusion noted.  IMPRESSION: Status post left hip replacement without evidence of complication.   Electronically Signed   By: Drusilla Kanner M.D.   On: 01/13/2014 16:33      Antibiotics:  On home ciprofloxacin, unclear how long or course.    Code Status: Full Family Communication: Pt and husband at bedside Disposition Plan: SNF when medically stable  HPI/Subjective: No events overnight.  Pain in the hip is about the same.  She prefers to be still.    Objective: Filed Vitals:   01/15/14 0205 01/15/14 0451 01/15/14 0800 01/15/14 1023  BP: 98/66 108/66  92/44  Pulse: 86 77  79  Temp: 98.5 F (36.9 C) 98.3 F (36.8 C)  98.9 F (37.2 C)  TempSrc: Oral Oral    Resp: 18 16 16 16   Height:      Weight:      SpO2: 96% 100% 94% 92%    Intake/Output Summary (Last 24 hours) at 01/15/14 1213 Last data filed at 01/15/14 0919  Gross per 24 hour  Intake   1600 ml  Output   1554 ml  Net     46 ml    Exam:   General:  Thin, NAD, following commands appropriately  Cardiovascular: Irreg Irreg, normal rate  Respiratory: Clear to auscultation bilaterally, no wheezing, crackles, IS at bedside  Abdomen: Soft, non tender, ND, +BS  Extremities: No edema, moves toes and can feel light pressure  Neuro: Grossly nonfocal  Data Reviewed: Basic Metabolic Panel:  Recent Labs Lab 01/12/14 2300 01/13/14 0530 01/14/14 0540 01/15/14 0447  NA 139  --  139 139  K 4.5  --  4.0 4.0  CL 95*  --  100 100  CO2 29  --  29 31  GLUCOSE 116*  --  107* 102*  BUN 33*  --  21 18  CREATININE 1.03 0.95 0.90 0.83  CALCIUM 10.5  --  8.6 8.8  MG  --   --  1.6 1.7   CBC:  Recent Labs Lab 01/12/14 2300 01/13/14 0530 01/14/14 0540 01/15/14 0447  WBC 8.9 7.5  5.9 8.1  NEUTROABS 7.0  --   --   --   HGB 15.3* 13.7 12.0 11.8*  HCT 45.8 41.7 35.5* 36.3  MCV 95.2 95.9 94.2 97.1  PLT 223 196 167 177     Recent Results (from the past 240 hour(s))  SURGICAL PCR SCREEN     Status: None   Collection Time    01/13/14  4:12 AM      Result Value Ref Range Status   MRSA, PCR NEGATIVE  NEGATIVE Final   Staphylococcus aureus NEGATIVE  NEGATIVE Final   Comment:            The Xpert SA Assay (FDA     approved for NASAL specimens     in patients over 78 years of age),     is one component of     a comprehensive surveillance     program.  Test performance has     been validated by [facility] for patients greater     than or equal to 78 year old.      It is not intended     to diagnose infection nor to     guide or monitor treatment.     Scheduled Meds: . carbidopa-levodopa  1 tablet Oral Q24H  . carbidopa-levodopa  2 tablet Oral BID  . dofetilide  250 mcg Oral BID  . enoxaparin (LOVENOX) injection  40 mg Subcutaneous Q24H  . feeding supplement (ENSURE COMPLETE)  237 mL Oral BID BM  . fentaNYL  12.5 mcg Transdermal Q72H  . metoprolol tartrate  25 mg Oral BID  . warfarin  7.5 mg Oral ONCE-1800  . Warfarin - Pharmacist Dosing Inpatient   Does not apply q1800   Continuous Infusions: . sodium chloride 50 mL/hr at 01/15/14 0245     Debe CoderMULLEN, EMILY, MD  [facility] Pager [phone] If 7PM-7AM, please contact night-coverage [email or web address] Password [email or web address] 01/15/2014, 12:13 PM   LOS: 3 days

## 2014-01-18 ENCOUNTER — Ambulatory Visit: Payer: Medicare Other | Admitting: Neurology

## 2014-03-14 ENCOUNTER — Ambulatory Visit: Payer: Medicare Other | Admitting: Neurology

## 2014-03-26 ENCOUNTER — Ambulatory Visit (INDEPENDENT_AMBULATORY_CARE_PROVIDER_SITE_OTHER): Payer: Medicare Other | Admitting: Neurology

## 2014-03-26 ENCOUNTER — Encounter: Payer: Self-pay | Admitting: Neurology

## 2014-03-26 VITALS — BP 104/62 | HR 64 | Ht 64.0 in | Wt 147.0 lb

## 2014-03-26 DIAGNOSIS — R531 Weakness: Secondary | ICD-10-CM

## 2014-03-26 DIAGNOSIS — R5381 Other malaise: Secondary | ICD-10-CM

## 2014-03-26 DIAGNOSIS — F0280 Dementia in other diseases classified elsewhere without behavioral disturbance: Secondary | ICD-10-CM

## 2014-03-26 DIAGNOSIS — G20A1 Parkinson's disease without dyskinesia, without mention of fluctuations: Secondary | ICD-10-CM

## 2014-03-26 DIAGNOSIS — G20 Parkinson's disease: Secondary | ICD-10-CM

## 2014-03-26 DIAGNOSIS — R5383 Other fatigue: Secondary | ICD-10-CM

## 2014-03-26 DIAGNOSIS — G4752 REM sleep behavior disorder: Secondary | ICD-10-CM

## 2014-03-26 NOTE — Patient Instructions (Signed)
1. Per Dr Timothy Lasso stop your Aldactone and they will call you on Thursday to see how you are doing.

## 2014-03-26 NOTE — Progress Notes (Signed)
Angel Greene was seen today in the movement disorders clinic for f/u re: PD.  [name] notes were reviewed.  The patient reported that she went to her neurosurgeon for f/u for her back and sx's were noted and she was referred to [name] in 2008-09.  [name] dx her with PD.  She has been on requip xl - 12 mg in the past.  She is now supposed to be on carbidopa/levodopa 25/100, 2 tablets in the AM, 1 in the afternoon and 1 before dinner.  Her biggest c/o is vivid dreams, so vivid that she describes them as hallucinations.  They only arise, however, out of sleep. She is also supposed to be on klonopin for RBD.  She cannot recall if she is actually taking it.  I called the pharmacy and it appears that she only picked it up one time in October.  I reviewed her notes, and it did not appear that there was a reason for this.  Since last visit, the patient was hospitalized for near syncope.  She has trouble recounting the exact episode since it was several months ago.  She does tell me about a time when she didn't feel good in church, but her husband reports that this was about the same incident.  Nonetheless, the near syncope was not long after I added Comtan, so it was thought that perhaps this was the etiology.  She has had a history of near syncope in the past with multiple episodes of dizziness in the past even before the addition of Comtan.  While in the hospital, on 07/27/2012, the patient did have a 6 beat run of asymptomatic V. Tach.  01/24/13 update:  The patient is supposed to be on carbidopa/levodopa 25/100, 2 tablets in the morning, one in the afternoon and one in the evening but she is actually on carbidopa/levodopa 25/100, 2 po bid.   Last visit, we did add clonazepam for REM behavior disorder.  She has noted that she is having more hallucinations since last visit.  However, her description of hallucinations reveals that these are not true hallucinations, but rather vivid dreams that feel very real  to her.  She never has hallucinations during the day.  She wakes up and thinks that what happened in a dream was real.  When asked if she was taking her clonazepam, she appeared confused and unsure.  I called the pharmacy and it was last filled on April 28, #30.  09/10/13 update:  The patient presents today, accompanied by her husband who helps supplement the history.  The patient has a history of mild Parkinson's disease.  She is on carbidopa/levodopa 25/100.  Last visit, I changed her from taking 2 tablets by mouth twice a day to 2 tablets in the morning, one at lunch and 2 in the evening.  She admits that she may miss the middle of the day dosing.  She is administering her medications.  She has refused physical therapy.  The patient does have REM behavior disorder.  Intermittently, we have prescribed clonazepam, but the last time that she tried it she only took it one time and then discontinued it.  I did review notes from her primary care physician.  There continues to be concerned both from her primary care physician as well as myself regarding memory.  Her husband states that they have looked into [facility] but haven't filled out any applications yet.    03/26/14 update:  Pt has a hx of PD.  She is on carbidopa/levodopa 25/100, 2 in the AM, 1 in the afternoon and 2 in the evening. She cannot tell when it wears off.   She had an appointment in July but was in the hospital with a femoral neck fx on the L that she sustained after a fall.  She had surgery on 01/13/14. States that she was getting up from the table when it happened.  Wasn't using the walker when it happened.  Went backward, however.  She has not fallen since then.  Still in PT at [facility].  Is feeling weak and asks about whether that is a medication interaction.  PREVIOUS MEDICATIONS: Requip XL - 12 mg, Lyrica, Pamelor, Seroquel (when with Dr. Thana Farr)  ALLERGIES:  No Known Allergies  CURRENT MEDICATIONS:  Current Outpatient  Prescriptions on File Prior to Visit  Medication Sig Dispense Refill  . acetaminophen (TYLENOL) 325 MG tablet Take 2 tablets (650 mg total) by mouth every 6 (six) hours as needed.  60 tablet  1  . Calcium Carbonate-Vitamin D (CALTRATE 600+D) 600-400 MG-UNIT per chew tablet Chew 2 tablets by mouth daily. 2 tablets in the morning, 1 tablet at noon, and 2 tablets at night.      . carbidopa-levodopa (SINEMET IR) 25-100 MG per tablet Take 1-2 tablets by mouth 3 (three) times daily.      Marland Kitchen dofetilide (TIKOSYN) 250 MCG capsule Take 250 mcg by mouth 2 (two) times daily.      . fentaNYL (DURAGESIC - DOSED MCG/HR) 12 MCG/HR Place 1 patch (12.5 mcg total) onto the skin every 3 (three) days.  5 patch  0  . furosemide (LASIX) 40 MG tablet Take 20 mg by mouth as needed for fluid.       Marland Kitchen lidocaine (LIDODERM) 5 % Place 1 patch onto the skin daily. Remove & Discard patch within 12 hours or as directed by MD      . metoprolol tartrate (LOPRESSOR) 25 MG tablet Take 1 tablet (25 mg total) by mouth 2 (two) times daily.  60 tablet  12  . omega-3 acid ethyl esters (LOVAZA) 1 G capsule Take 1 g by mouth daily.      Marland Kitchen spironolactone (ALDACTONE) 25 MG tablet Take 25 mg by mouth 2 (two) times daily.       . traMADol (ULTRAM) 50 MG tablet Take 1 tablet (50 mg total) by mouth every 6 (six) hours as needed. Maximum dose= 8 tablets per day. PAIN  30 tablet  0  . warfarin (COUMADIN) 5 MG tablet Take 5 mg by mouth daily.       No current facility-administered medications on file prior to visit.    PAST MEDICAL HISTORY:   Past Medical History  Diagnosis Date  . Atrial fibrillation   . Hypertension     hyperlipidemia-  TEE,CARDIOLYTE STRESS 11/12 with LOV  DR TILLEY AND EKG ON CHART  . Peripheral vascular disease     Hx   DVT x 2  2000  . H/O hiatal hernia   . Bladder infection     06/10/11 with treatment by Dr Timothy Lasso  . Arthritis   . History of breast cancer     2000- breast cancer followed by 6 months of chemo,  radiation and  PO meds  . History of DVT (deep vein thrombosis) 06/24/2002  . Parkinsonism     PAST SURGICAL HISTORY:   Past Surgical History  Procedure Laterality Date  . Breast surgery      LUMPECTOMY  WITH AXILLIARY DISSECTION   2000  . Back surgery      2006 - X-Stop spacers placedL3-L4, L4-L5; 2008 - laminectomy and fusion, L3-L5; 2011 Laminectomy and extension of fusion L5-S1  . Tonsillectomy    . Cardiac catheterization      bilateral cataract extraction with IOL  . Cholecystectomy    . Joint replacement      RIGHT HIP REPLACEMENT   1996  . Total knee arthroplasty  06/23/2011    Procedure: TOTAL KNEE ARTHROPLASTY;  Surgeon: Loanne Drilling;  Location: [facility];  Service: Orthopedics;  Laterality: Right;  . Breast lumpectomy    . Mastectomy    . Breast lumpectomy    . Hip arthroplasty Left 01/13/2014    Procedure: ARTHROPLASTY BIPOLAR HIP;  Surgeon: Verlee Rossetti, MD;  Location: [facility];  Service: Orthopedics;  Laterality: Left;    SOCIAL HISTORY:   History   Social History  . Marital Status: Married    Spouse Name: N/A    Number of Children: N/A  . Years of Education: N/A   Occupational History  . Retired     [employer], broadcasting/film/video, Mudlogger   Social History Main Topics  . Smoking status: Never Smoker   . Smokeless tobacco: Never Used  . Alcohol Use: No  . Drug Use: No  . Sexual Activity: No   Other Topics Concern  . Not on file   Social History Narrative  . No narrative on file    FAMILY HISTORY:   Family Status  Relation Status Death Age  . Mother Deceased     CA, breast  . Father Deceased     CVA  . Brother Deceased     2, MI    ROS:  A complete 10 system review of systems was obtained and was unremarkable apart from what is mentioned above.  PHYSICAL EXAMINATION:    VITALS:   Filed Vitals:   03/26/14 1440  BP: 104/62  Pulse: 64  Height:  (1.626 m)  Weight: 147 lb (66.679 kg)    GEN:  The patient appears stated age and is in NAD. HEENT:   Normocephalic, atraumatic.  The mucous membranes are moist. The superficial temporal arteries are without ropiness or tenderness. CV:  RRR Lungs:  CTAB Neck/HEME:  There are no carotid bruits bilaterally.  Neurological examination:  Orientation:   Oriented to month/date/year. Cranial nerves: There is good facial symmetry. Pupils are equal round and reactive to light bilaterally. Fundoscopic exam reveals clear margins bilaterally. Extraocular muscles are intact. The visual fields are full to confrontational testing. The speech is fluent and clear. Soft palate rises symmetrically and there is no tongue deviation. Hearing is intact to conversational tone. Sensation: Sensation is intact to light touch throughout. Motor: Strength is 5/5 in the bilateral upper and lower extremities.   Shoulder shrug is equal and symmetric.  There is no pronator drift. Deep tendon reflexes: Deep tendon reflexes are 1/4 at the bilateral biceps, triceps, brachioradialis, and absent at the patella and achilles. Plantar responses are downgoing bilaterally.  Movement examination: Tone: There is increased tone in the UE's bilaterally, left greater than right, overall moderate. Abnormal movements:  There is an intermittent right upper extremity resting tremor.   Coordination:  There is  decremation with RAM's, left greater than right Gait and Station: The patient ambulates with a walker today because of recent hip fracture.  She is short stepped, but fairly stable with the walker.  ASSESSMENT/PLAN: 1.  Idiopathic  Parkinson's disease.    -She is more stiff today, and I am going to slightly increase her levodopa from 5 tablets per day to six tablets per day (carbidopa/levodopa 25/100, 2 tablets 3 times per day prior to meals).  If this does not help, I told them that I would likely bring her back for a UPDRS motor on/off test.  -I am going to hold the entacapone for now, since she had some near syncope after the addition.   However, given the fact that when she was hospitalized she had a 6 beat run of V. tach and given history of near-syncope and dizziness prior to the addition of entacapone, I am not completely convinced that it was the medication.  Nonetheless, we will hold it.    -Talked about using her walker at all times. 2.  weakness and lack of energy.  -I wondered if some of this is not from the lowering of the blood pressure.  She is on 2 blood pressure medications (spironolactone and Lopressor).  She was not orthostatic in the office today.  I was going to make a appointment with her primary care physician to have her discuss this with him, but when my medical assistant called them, she ended up talking with Dr. Timothy Lasso, and he told her to just tell the patient to stop the spironolactone. 3  REM behavior disorder.  This is commonly associated with Parkinson's disease.    -On no meds.  Tried klonopin but not consistently 5.  memory loss.  This is likely related to the Parkinson's disease.    -Lengthy discussion about safety.   6..  Return in about 4 weeks (around 04/23/2014). 7.  Time in room:  40 min, greater than 50% in counseling and coordinating care.

## 2014-04-18 ENCOUNTER — Telehealth: Payer: Self-pay | Admitting: Neurology

## 2014-04-18 NOTE — Telephone Encounter (Signed)
WE RESCH APPT FROM 04-23-14 TO 04-30-14

## 2014-04-23 ENCOUNTER — Ambulatory Visit: Payer: Medicare Other | Admitting: Neurology

## 2014-04-30 ENCOUNTER — Ambulatory Visit: Payer: Medicare Other | Admitting: Neurology

## 2014-04-30 ENCOUNTER — Telehealth: Payer: Self-pay | Admitting: Neurology

## 2014-04-30 NOTE — Telephone Encounter (Signed)
Pt will not be able to make her appt for today 04/30/14 due to her husband falling. Will call later to r/s

## 2014-05-01 NOTE — Telephone Encounter (Signed)
Marked as no show b/c pt called the same day to canc but a no show letter was not sent / Sherri S.

## 2014-05-20 ENCOUNTER — Other Ambulatory Visit: Payer: Self-pay | Admitting: Neurology

## 2014-05-20 MED ORDER — CARBIDOPA-LEVODOPA 25-100 MG PO TABS: 2.0000 | ORAL_TABLET | Freq: Three times a day (TID) | ORAL | Status: AC

## 2014-05-20 NOTE — Telephone Encounter (Signed)
Carbidopa Levodopa refill requested. Per last office note- patient to remain on medication. Refill approved and sent to patient's pharmacy.   

## 2014-06-17 ENCOUNTER — Encounter: Payer: Self-pay | Admitting: Neurology

## 2014-08-01 ENCOUNTER — Ambulatory Visit: Payer: Self-pay | Admitting: Neurology

## 2014-08-02 ENCOUNTER — Telehealth: Payer: Self-pay | Admitting: Neurology

## 2014-08-02 NOTE — Telephone Encounter (Signed)
F/U / 08/02/14-lm @ home # for pt to call, need to r/s 08/05/14 appt, Dr. Arbutus Leasat has a meeting in the afternoon. Tried to call patient's alt. #, it has been disconnected / Sherri S.

## 2014-08-05 ENCOUNTER — Telehealth: Payer: Self-pay | Admitting: Neurology

## 2014-08-05 ENCOUNTER — Ambulatory Visit: Payer: Self-pay | Admitting: Neurology

## 2014-08-05 NOTE — Telephone Encounter (Signed)
We moved patient appt to 08-06-14 at 1:45 due to Dr Tat having a meeting on 08-05-14

## 2014-08-06 ENCOUNTER — Encounter: Payer: Self-pay | Admitting: Neurology

## 2014-08-06 ENCOUNTER — Ambulatory Visit (INDEPENDENT_AMBULATORY_CARE_PROVIDER_SITE_OTHER): Payer: Medicare Other | Admitting: Neurology

## 2014-08-06 VITALS — BP 122/70 | HR 73 | Ht 65.0 in | Wt 157.0 lb

## 2014-08-06 DIAGNOSIS — G20 Parkinson's disease: Secondary | ICD-10-CM

## 2014-08-06 DIAGNOSIS — R531 Weakness: Secondary | ICD-10-CM

## 2014-08-06 NOTE — Progress Notes (Signed)
Angel Greene was seen today in the movement disorders clinic for f/u re: PD.  [name] notes were reviewed.  The patient reported that she went to her neurosurgeon for f/u for her back and sx's were noted and she was referred to [name] in 2008-09.  [name] dx her with PD.  She has been on requip xl - 12 mg in the past.  She is now supposed to be on carbidopa/levodopa 25/100, 2 tablets in the AM, 1 in the afternoon and 1 before dinner.  Her biggest c/o is vivid dreams, so vivid that she describes them as hallucinations.  They only arise, however, out of sleep. She is also supposed to be on klonopin for RBD.  She cannot recall if she is actually taking it.  I called the pharmacy and it appears that she only picked it up one time in October.  I reviewed her notes, and it did not appear that there was a reason for this.  Since last visit, the patient was hospitalized for near syncope.  She has trouble recounting the exact episode since it was several months ago.  She does tell me about a time when she didn't feel good in church, but her husband reports that this was about the same incident.  Nonetheless, the near syncope was not long after I added Comtan, so it was thought that perhaps this was the etiology.  She has had a history of near syncope in the past with multiple episodes of dizziness in the past even before the addition of Comtan.  While in the hospital, on 07/27/2012, the patient did have a 6 beat run of asymptomatic V. Tach.  01/24/13 update:  The patient is supposed to be on carbidopa/levodopa 25/100, 2 tablets in the morning, one in the afternoon and one in the evening but she is actually on carbidopa/levodopa 25/100, 2 po bid.   Last visit, we did add clonazepam for REM behavior disorder.  She has noted that she is having more hallucinations since last visit.  However, her description of hallucinations reveals that these are not true hallucinations, but rather vivid dreams that feel very real  to her.  She never has hallucinations during the day.  She wakes up and thinks that what happened in a dream was real.  When asked if she was taking her clonazepam, she appeared confused and unsure.  I called the pharmacy and it was last filled on April 28, #30.  09/10/13 update:  The patient presents today, accompanied by her husband who helps supplement the history.  The patient has a history of mild Parkinson's disease.  She is on carbidopa/levodopa 25/100.  Last visit, I changed her from taking 2 tablets by mouth twice a day to 2 tablets in the morning, one at lunch and 2 in the evening.  She admits that she may miss the middle of the day dosing.  She is administering her medications.  She has refused physical therapy.  The patient does have REM behavior disorder.  Intermittently, we have prescribed clonazepam, but the last time that she tried it she only took it one time and then discontinued it.  I did review notes from her primary care physician.  There continues to be concerned both from her primary care physician as well as myself regarding memory.  Her husband states that they have looked into [facility] but haven't filled out any applications yet.    03/26/14 update:  Pt has a hx of PD.  She is on carbidopa/levodopa 25/100, 2 in the AM, 1 in the afternoon and 2 in the evening. She cannot tell when it wears off.   She had an appointment in July but was in the hospital with a femoral neck fx on the L that she sustained after a fall.  She had surgery on 01/13/14. States that she was getting up from the table when it happened.  Wasn't using the walker when it happened.  Went backward, however.  She has not fallen since then.  Still in PT at [facility].  Is feeling weak and asks about whether that is a medication interaction.  08/06/14 update:  The patient has a history of Parkinson's disease.  Last visit, I increased her carbidopa/levodopa from 5 tablets per day to 6 tablets per day (2 tablets 3 times per  day).  Last visit she was complaining of weakness and decreased energy and her spironolactone was d/c.   She isn't sure if she is still on both on these but is still c/o weakness.  I did call her pharmacy and she is off of the spironolactone.  She is noticing a loss of balance.  She fell one time in the house but didn't get hurt (slid off the bed).  No dizziness.  She is on the duragesic patch "most of the time" but she isn't sure it helps.  She has not done PT.  She is now a resident at [facility] and they have to walk to get meals but that is the only real exercise they get.  She is complaining about knee pain but has an appointment with the orthopedic surgeon, but was not able to get in until August.  No hallucinations.  No lightheadedness or near syncope.  PREVIOUS MEDICATIONS: Requip XL - 12 mg, Lyrica, Pamelor, Seroquel (when with Dr. Thana Farr)  ALLERGIES:  No Known Allergies  CURRENT MEDICATIONS:  Current Outpatient Prescriptions on File Prior to Visit  Medication Sig Dispense Refill  . acetaminophen (TYLENOL) 325 MG tablet Take 2 tablets (650 mg total) by mouth every 6 (six) hours as needed. 60 tablet 1  . Calcium Carbonate-Vitamin D (CALTRATE 600+D) 600-400 MG-UNIT per chew tablet Chew 2 tablets by mouth daily. 2 tablets in the morning, 1 tablet at noon, and 2 tablets at night.    . carbidopa-levodopa (SINEMET IR) 25-100 MG per tablet Take 2 tablets by mouth 3 (three) times daily. 180 tablet 5  . clonazepam (KLONOPIN) 0.125 MG disintegrating tablet     . dofetilide (TIKOSYN) 250 MCG capsule Take 250 mcg by mouth 2 (two) times daily.    . fentaNYL (DURAGESIC - DOSED MCG/HR) 12 MCG/HR Place 1 patch (12.5 mcg total) onto the skin every 3 (three) days. 5 patch 0  . furosemide (LASIX) 40 MG tablet Take 20 mg by mouth as needed for fluid.     Marland Kitchen lidocaine (LIDODERM) 5 % Place 1 patch onto the skin daily. Remove & Discard patch within 12 hours or as directed by MD    . metoprolol tartrate  (LOPRESSOR) 25 MG tablet Take 1 tablet (25 mg total) by mouth 2 (two) times daily. 60 tablet 12  . omega-3 acid ethyl esters (LOVAZA) 1 G capsule Take 1 g by mouth daily.    . traMADol (ULTRAM) 50 MG tablet Take 1 tablet (50 mg total) by mouth every 6 (six) hours as needed. Maximum dose= 8 tablets per day. PAIN 30 tablet 0  . warfarin (COUMADIN) 5 MG tablet Take 5 mg  by mouth daily.     No current facility-administered medications on file prior to visit.    PAST MEDICAL HISTORY:   Past Medical History  Diagnosis Date  . Atrial fibrillation   . Hypertension     hyperlipidemia-  TEE,CARDIOLYTE STRESS 11/12 with LOV  DR TILLEY AND EKG ON CHART  . Peripheral vascular disease     Hx   DVT x 2  2000  . H/O hiatal hernia   . Bladder infection     06/10/11 with treatment by Dr Timothy Lasso  . Arthritis   . History of breast cancer     2000- breast cancer followed by 6 months of chemo, radiation and  PO meds  . History of DVT (deep vein thrombosis) 06/24/2002  . Parkinsonism     PAST SURGICAL HISTORY:   Past Surgical History  Procedure Laterality Date  . Breast surgery      LUMPECTOMY  WITH AXILLIARY DISSECTION   2000  . Back surgery      2006 - X-Stop spacers placedL3-L4, L4-L5; 2008 - laminectomy and fusion, L3-L5; 2011 Laminectomy and extension of fusion L5-S1  . Tonsillectomy    . Cardiac catheterization      bilateral cataract extraction with IOL  . Cholecystectomy    . Joint replacement      RIGHT HIP REPLACEMENT   1996  . Total knee arthroplasty  06/23/2011    Procedure: TOTAL KNEE ARTHROPLASTY;  Surgeon: Loanne Drilling;  Location: [facility];  Service: Orthopedics;  Laterality: Right;  . Breast lumpectomy    . Mastectomy    . Breast lumpectomy    . Hip arthroplasty Left 01/13/2014    Procedure: ARTHROPLASTY BIPOLAR HIP;  Surgeon: Verlee Rossetti, MD;  Location: [facility];  Service: Orthopedics;  Laterality: Left;    SOCIAL HISTORY:   History   Social History  . Marital Status:  Married    Spouse Name: N/A    Number of Children: N/A  . Years of Education: N/A   Occupational History  . Retired     Runner, broadcasting/film/video, Mudlogger   Social History Main Topics  . Smoking status: Never Smoker   . Smokeless tobacco: Never Used  . Alcohol Use: No  . Drug Use: No  . Sexual Activity: No   Other Topics Concern  . Not on file   Social History Narrative    FAMILY HISTORY:   Family Status  Relation Status Death Age  . Mother Deceased     CA, breast  . Father Deceased     CVA  . Brother Deceased     2, MI    ROS:  A complete 10 system review of systems was obtained and was unremarkable apart from what is mentioned above.  PHYSICAL EXAMINATION:    VITALS:   Filed Vitals:   08/06/14 1406  BP: 122/70  Pulse: 73  Height:  (1.651 m)  Weight: 157 lb (71.215 kg)    GEN:  The patient appears stated age and is in NAD. HEENT:  Normocephalic, atraumatic.  The mucous membranes are moist. The superficial temporal arteries are without ropiness or tenderness. CV:  RRR Lungs:  CTAB Neck/HEME:  There are no carotid bruits bilaterally.  Neurological examination:  Orientation:   Oriented to month/date/year. Cranial nerves: There is good facial symmetry. Pupils are equal round and reactive to light bilaterally. Fundoscopic exam reveals clear margins bilaterally. Extraocular muscles are intact. The visual fields are full to confrontational testing. The speech  is fluent and clear. Soft palate rises symmetrically and there is no tongue deviation. Hearing is intact to conversational tone. Sensation: Sensation is intact to light touch throughout. Motor: Strength is 5/5 in the bilateral upper and lower extremities.   Shoulder shrug is equal and symmetric.  There is no pronator drift. Deep tendon reflexes: Deep tendon reflexes are 1/4 at the bilateral biceps, triceps, brachioradialis, and absent at the patella and achilles. Plantar responses are downgoing bilaterally.  Movement  examination: Tone: There is normal tone bilaterally. Abnormal movements:  There is a rare tremor of the left leg.  Otherwise, I did not see tremor. Coordination:  There is  no decremation today.Gait and Station: The patient ambulates with a walker today because of hip fracture last August.  She has walked with a walker since.  ASSESSMENT/PLAN: 1.  Idiopathic Parkinson's disease.    -She is doing better on my clinical exam since going up from 5 tablets of levodopa to carbidopa/levodopa 25/100, 2 tablets 3 times per day.  -I am going to hold the entacapone for now, since she had some near syncope after the addition.  However, given the fact that when she was hospitalized she had a 6 beat run of V. tach and given history of near-syncope and dizziness prior to the addition of entacapone, I am not completely convinced that it was the medication.  Nonetheless, we will hold it.    -Talked about using her walker at all times. 2.  weakness and lack of energy.  -I think that she would benefit from PT but her husband doesn't really want her to attend even though [facility].   I told them to let me know if she changes her mind.  From a parkinsonian standpoint, I did not see any reason why she should feel this way, as she clinically looks better today. 3  REM behavior disorder.  This is commonly associated with Parkinson's disease.    -On no meds.  Tried klonopin but not consistently 5.  memory loss.  This is likely related to the Parkinson's disease.    -Lengthy discussion about safety.   [facility]. 6.  Return in about 5 months (around 01/04/2015).

## 2014-09-13 ENCOUNTER — Encounter (HOSPITAL_COMMUNITY): Payer: Medicare Other

## 2014-09-30 ENCOUNTER — Telehealth: Payer: Self-pay | Admitting: Neurology

## 2014-09-30 NOTE — Telephone Encounter (Signed)
Pt called wanting to speak to a nurse regarding her medications (she does not remember the name but ti is regarding her parkinsons) The number below is Pt's niece Marliss CzarLeigh # but the pt was the one to call.  C/b [phone]

## 2014-09-30 NOTE — Telephone Encounter (Signed)
Left message on machine for patient to call back.

## 2014-09-30 NOTE — Telephone Encounter (Signed)
Patient's niece called back to let us know that [place] is moving to [place] (due to [place] husband's death) and will be living with her. She wanted a recommendation for a neurologist closer to there. [facility] - so I recommended Raquel SarnaNina Browner. They will look into it and let us know what they need from us.

## 2014-11-11 IMAGING — CR DG CHEST 2V
2 series · 2 of 2 positions shown · non-contrast
Comparison: 11/09/2011

CLINICAL DATA: Mid chest pain this am, states feels like heart
fluttering, weakness

CHEST - 2 VIEW

[w chest pa]
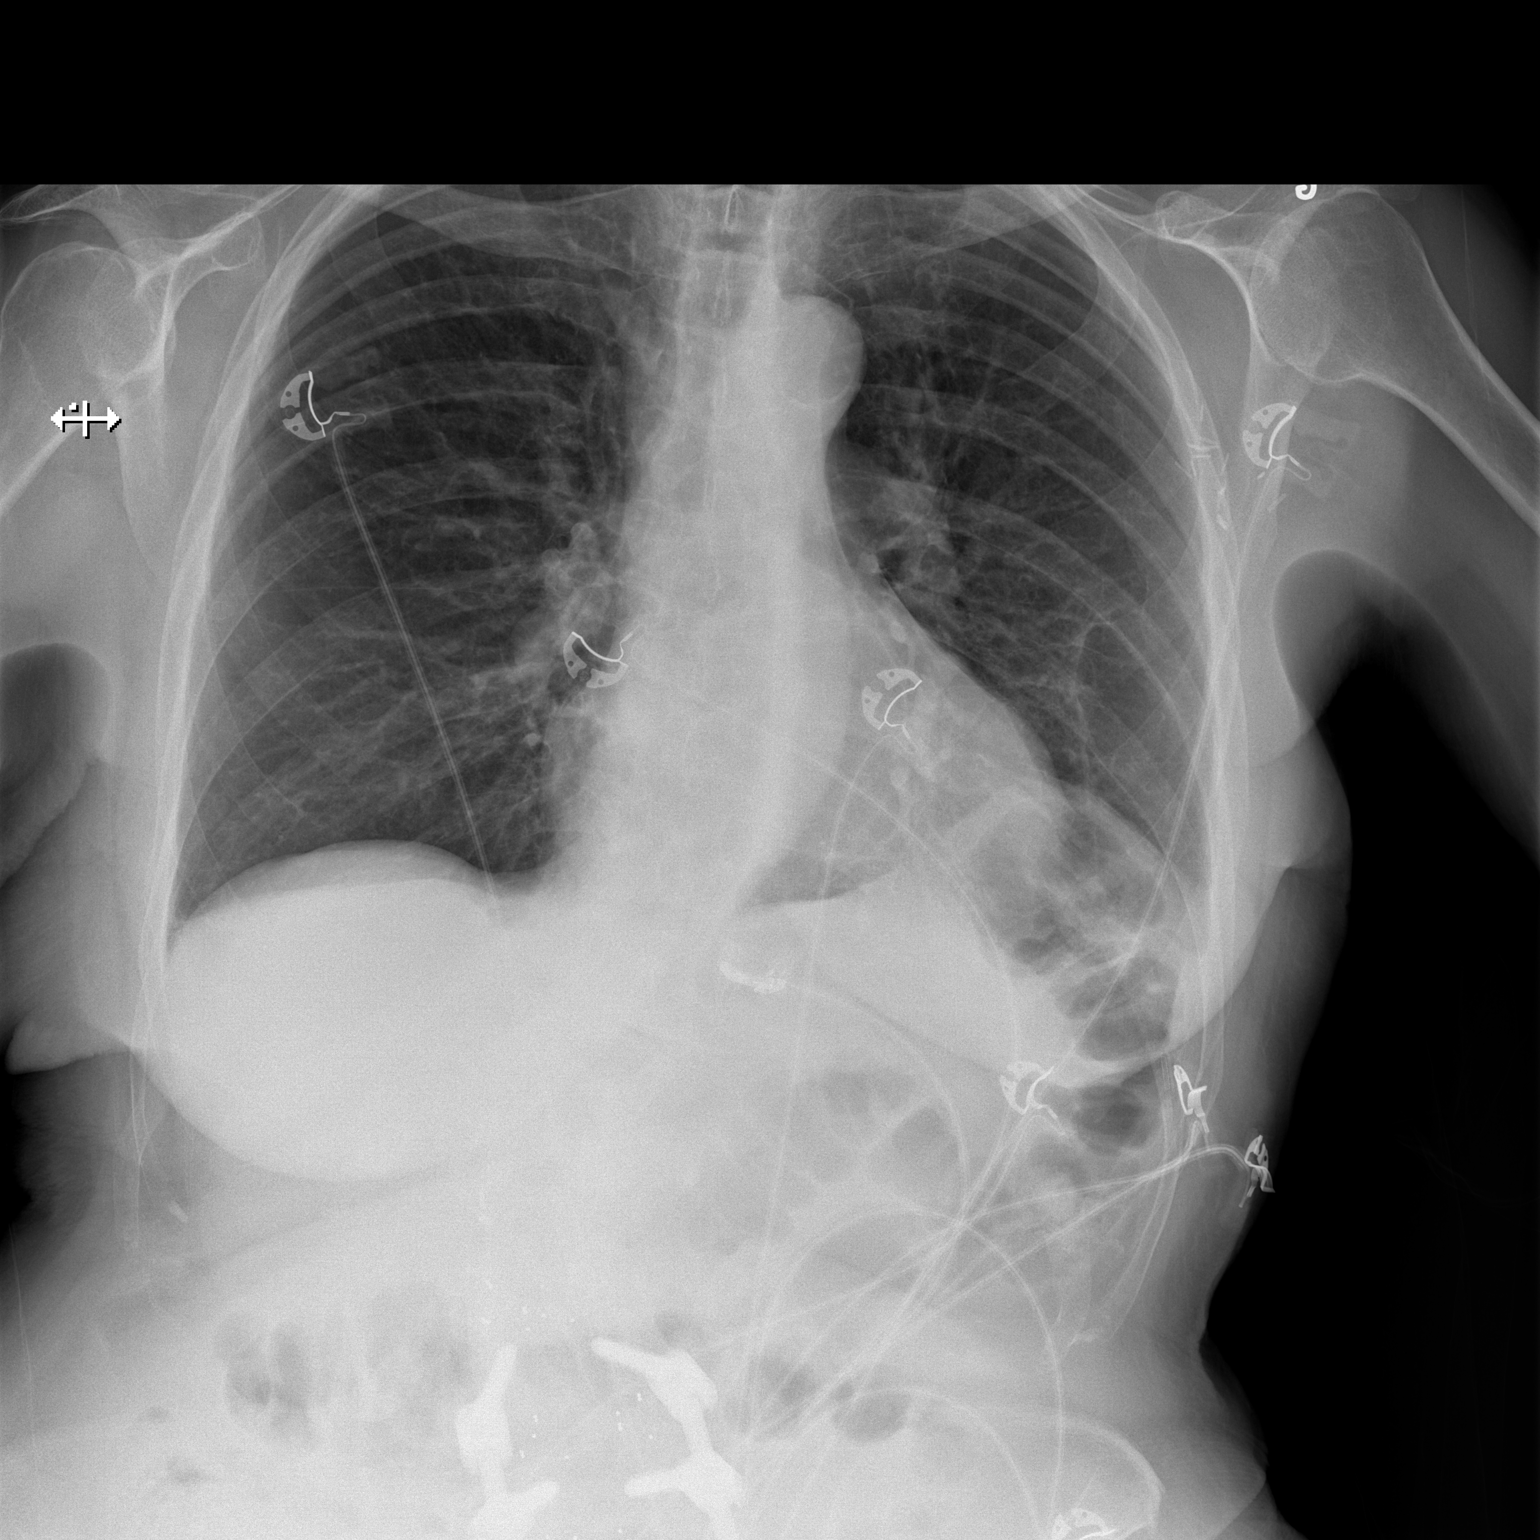

[w chest lat]
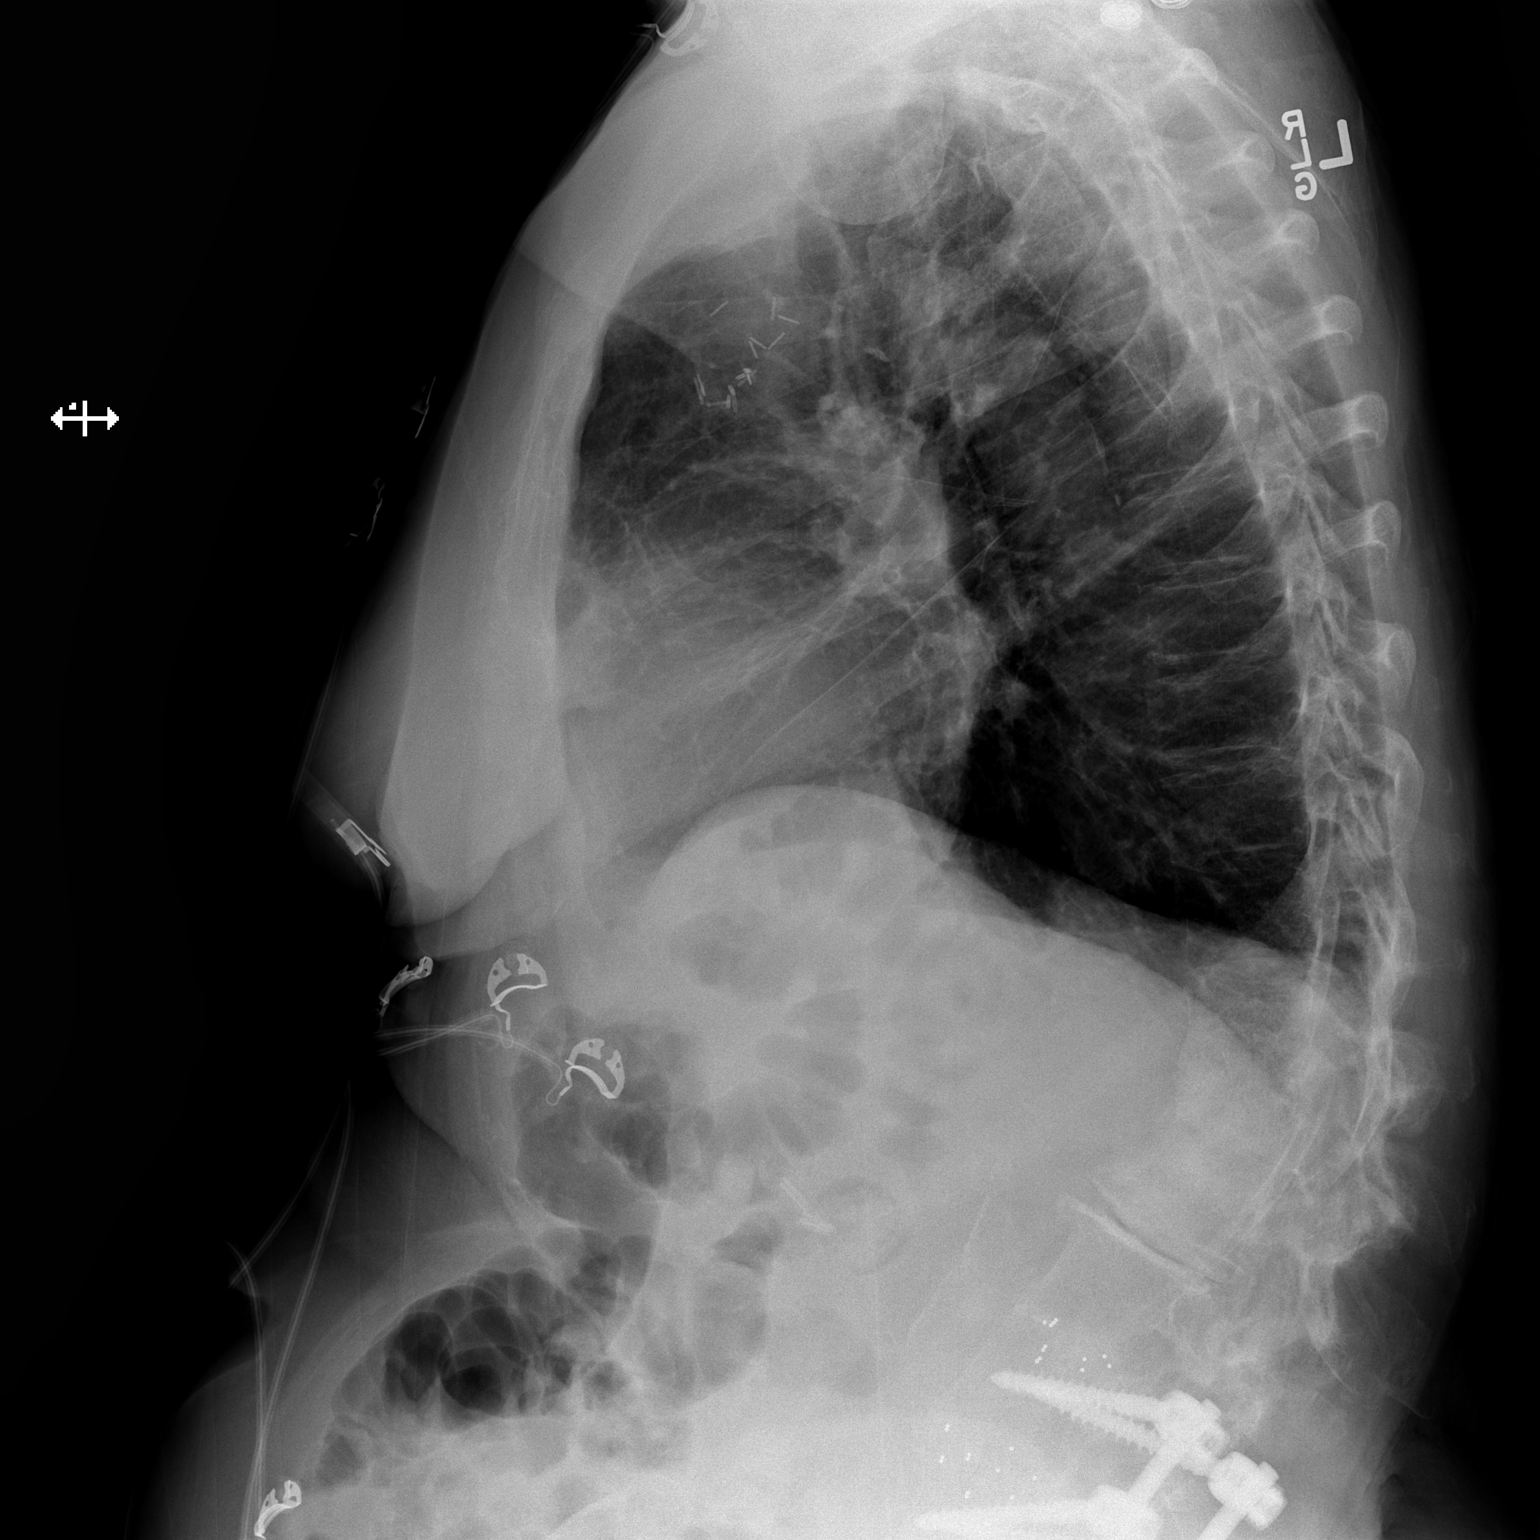

[2 of 2 positions shown; findings below may reference images not displayed]

FINDINGS: Stable scarring at the left lung base.  Atherosclerotic
calcification of the aortic arch noted with mild aortic tortuosity.
Heart size within normal limits.

Left axillary clips noted.  The symmetry breast tissue may indicate
lumpectomy.

Kyphotic angulation at the thoracolumbar junction noted.  Lumbar
fixation hardware in place.  This appears stable.
IMPRESSION: 1.  Stable scarring at the left lung base, primarily in the
lingula.  No acute findings observed.

## 2014-12-04 ENCOUNTER — Telehealth: Payer: Self-pay | Admitting: Neurology

## 2014-12-04 NOTE — Telephone Encounter (Signed)
-----   Message from Octaviano Battyebecca S Tat, DO sent at 12/03/2014  4:29 PM EDT ----- Last phone call said that pt was moving and we had [facility].  Will she be coming on Thursday then?  May need to call niece

## 2014-12-04 NOTE — Telephone Encounter (Signed)
Left message on machine for patient's niece Marliss CzarLeigh to call back ( [phone] ). Awaiting call to see if patient is to keep Thursday's appt with our office.

## 2014-12-05 ENCOUNTER — Ambulatory Visit: Payer: Medicare Other | Admitting: Neurology

## 2015-10-04 DEATH — deceased
